# Patient Record
Sex: Male | Born: 1960
Health system: Southern US, Community
[De-identification: ages and names within clinical notes are randomized; demographics above are authoritative.]

## PROBLEM LIST (undated history)

## (undated) DIAGNOSIS — E349 Endocrine disorder, unspecified: Secondary | ICD-10-CM

## (undated) DIAGNOSIS — E785 Hyperlipidemia, unspecified: Secondary | ICD-10-CM

## (undated) DIAGNOSIS — N529 Male erectile dysfunction, unspecified: Secondary | ICD-10-CM

## (undated) DIAGNOSIS — K219 Gastro-esophageal reflux disease without esophagitis: Secondary | ICD-10-CM

## (undated) DIAGNOSIS — E119 Type 2 diabetes mellitus without complications: Secondary | ICD-10-CM

## (undated) DIAGNOSIS — G4733 Obstructive sleep apnea (adult) (pediatric): Secondary | ICD-10-CM

## (undated) HISTORY — DX: Endocrine disorder, unspecified: E34.9

## (undated) HISTORY — DX: Obstructive sleep apnea (adult) (pediatric): G47.33

## (undated) HISTORY — PX: TONSILLECTOMY: SUR1361

## (undated) HISTORY — DX: Gastro-esophageal reflux disease without esophagitis: K21.9

## (undated) HISTORY — DX: Hyperlipidemia, unspecified: E78.5

## (undated) HISTORY — DX: Type 2 diabetes mellitus without complications: E11.9

## (undated) HISTORY — DX: Male erectile dysfunction, unspecified: N52.9

---

## 2003-03-10 ENCOUNTER — Ambulatory Visit (HOSPITAL_BASED_OUTPATIENT_CLINIC_OR_DEPARTMENT_OTHER): Admission: RE | Admit: 2003-03-10 | Discharge: 2003-03-10 | Payer: Self-pay | Admitting: Family Medicine

## 2004-02-07 ENCOUNTER — Encounter: Admission: RE | Admit: 2004-02-07 | Discharge: 2004-02-07 | Payer: Self-pay | Admitting: Family Medicine

## 2004-06-24 ENCOUNTER — Ambulatory Visit: Payer: Self-pay | Admitting: Pulmonary Disease

## 2004-06-26 ENCOUNTER — Ambulatory Visit (HOSPITAL_COMMUNITY): Admission: RE | Admit: 2004-06-26 | Discharge: 2004-06-26 | Payer: Self-pay | Admitting: Pulmonary Disease

## 2004-07-27 ENCOUNTER — Ambulatory Visit: Payer: Self-pay | Admitting: Pulmonary Disease

## 2004-07-31 ENCOUNTER — Ambulatory Visit: Payer: Self-pay | Admitting: Pulmonary Disease

## 2004-08-13 ENCOUNTER — Ambulatory Visit: Payer: Self-pay | Admitting: Gastroenterology

## 2004-09-04 ENCOUNTER — Ambulatory Visit: Payer: Self-pay | Admitting: Gastroenterology

## 2004-10-09 ENCOUNTER — Ambulatory Visit: Payer: Self-pay | Admitting: Pulmonary Disease

## 2008-10-17 ENCOUNTER — Encounter: Payer: Self-pay | Admitting: Cardiovascular Disease

## 2008-11-02 ENCOUNTER — Encounter: Payer: Self-pay | Admitting: Cardiovascular Disease

## 2008-12-17 ENCOUNTER — Encounter: Payer: Self-pay | Admitting: Cardiovascular Disease

## 2008-12-18 ENCOUNTER — Ambulatory Visit: Payer: Self-pay | Admitting: Cardiovascular Disease

## 2008-12-18 DIAGNOSIS — E785 Hyperlipidemia, unspecified: Secondary | ICD-10-CM

## 2008-12-18 DIAGNOSIS — R0602 Shortness of breath: Secondary | ICD-10-CM | POA: Insufficient documentation

## 2008-12-19 ENCOUNTER — Ambulatory Visit: Payer: Self-pay | Admitting: Cardiovascular Disease

## 2008-12-19 ENCOUNTER — Encounter: Payer: Self-pay | Admitting: Cardiovascular Disease

## 2010-06-07 LAB — CONVERTED CEMR LAB
ALT: 28 units/L
BUN: 14 mg/dL
Basophils Relative: 1 %
CO2: 21 meq/L
Calcium: 9.2 mg/dL
Chloride: 102 meq/L
Creatinine, Ser: 1.05 mg/dL
Lymphocytes, automated: 30 %
Neutrophils Relative %: 60 %
PSA: 0.6 ng/mL
Platelets: 191 10*3/uL
RBC: 1.59 M/uL
RDW: 13.7 %
Total Bilirubin: 0.4 mg/dL
WBC: 7.7 10*3/uL

## 2010-10-30 ENCOUNTER — Encounter: Payer: Self-pay | Admitting: Cardiovascular Disease

## 2011-01-20 ENCOUNTER — Encounter: Payer: Self-pay | Admitting: Gastroenterology

## 2011-01-27 ENCOUNTER — Telehealth: Payer: Self-pay | Admitting: *Deleted

## 2011-01-27 NOTE — Telephone Encounter (Signed)
Sent no show letter and cx'd procedure for colon on 02/10/11.  Kyle Campbell

## 2011-02-02 ENCOUNTER — Other Ambulatory Visit: Payer: Self-pay | Admitting: Gastroenterology

## 2011-02-10 ENCOUNTER — Ambulatory Visit (AMBULATORY_SURGERY_CENTER): Payer: Managed Care, Other (non HMO) | Admitting: *Deleted

## 2011-02-10 ENCOUNTER — Other Ambulatory Visit: Payer: Self-pay | Admitting: Gastroenterology

## 2011-02-10 VITALS — Ht 73.0 in | Wt 244.5 lb

## 2011-02-10 DIAGNOSIS — Z1211 Encounter for screening for malignant neoplasm of colon: Secondary | ICD-10-CM

## 2011-02-10 MED ORDER — MOVIPREP 100 G PO SOLR: ORAL | Status: DC

## 2011-02-15 ENCOUNTER — Telehealth: Payer: Self-pay | Admitting: Gastroenterology

## 2011-02-18 NOTE — Telephone Encounter (Signed)
Rx for MoviPrep call in to [facility] on [address] in [place].  Message left on answering machine. Ezra Sites

## 2011-02-24 ENCOUNTER — Ambulatory Visit (AMBULATORY_SURGERY_CENTER): Payer: Managed Care, Other (non HMO) | Admitting: Gastroenterology

## 2011-02-24 ENCOUNTER — Encounter: Payer: Self-pay | Admitting: Gastroenterology

## 2011-02-24 VITALS — BP 116/72 | HR 72 | Temp 98.4°F | Resp 18 | Ht 73.0 in | Wt 244.0 lb

## 2011-02-24 DIAGNOSIS — K625 Hemorrhage of anus and rectum: Secondary | ICD-10-CM

## 2011-02-24 DIAGNOSIS — Z1211 Encounter for screening for malignant neoplasm of colon: Secondary | ICD-10-CM

## 2011-02-24 DIAGNOSIS — K648 Other hemorrhoids: Secondary | ICD-10-CM

## 2011-02-24 LAB — GLUCOSE, CAPILLARY: Glucose-Capillary: 98 mg/dL (ref 70–99)

## 2011-02-24 LAB — HM COLONOSCOPY

## 2011-02-24 MED ORDER — SODIUM CHLORIDE 0.9 % IV SOLN: 500.0000 mL | INTRAVENOUS | Status: DC

## 2011-02-24 NOTE — Patient Instructions (Signed)
Discharge instructions given with verbal understanding.  Handouts on hemorrhoids and a high fiber diet given.  Resume previous medications. 

## 2011-02-25 ENCOUNTER — Telehealth: Payer: Self-pay | Admitting: *Deleted

## 2011-02-25 NOTE — Telephone Encounter (Signed)

## 2012-08-08 ENCOUNTER — Telehealth: Payer: Self-pay | Admitting: Family Medicine

## 2012-08-08 ENCOUNTER — Other Ambulatory Visit: Payer: Self-pay | Admitting: Family Medicine

## 2012-08-08 MED ORDER — TESTOSTERONE CYPIONATE 200 MG/ML IM SOLN: 200.0000 mg | INTRAMUSCULAR | Status: DC

## 2012-08-08 NOTE — Telephone Encounter (Signed)
rx refilled.

## 2012-08-11 ENCOUNTER — Telehealth: Payer: Self-pay | Admitting: Family Medicine

## 2012-08-14 NOTE — Telephone Encounter (Signed)
Pt aware to pick up

## 2012-08-14 NOTE — Telephone Encounter (Signed)
There is a written rx on my desk.

## 2012-12-14 ENCOUNTER — Encounter: Payer: Self-pay | Admitting: Family Medicine

## 2012-12-14 ENCOUNTER — Other Ambulatory Visit: Payer: Self-pay | Admitting: Family Medicine

## 2012-12-14 NOTE — Telephone Encounter (Signed)
Medication refill for one time only.  Patient needs to be seen.  Letter sent for patient to call and schedule 

## 2012-12-27 ENCOUNTER — Telehealth: Payer: Self-pay | Admitting: Family Medicine

## 2012-12-28 MED ORDER — FLUTICASONE-SALMETEROL 250-50 MCG/DOSE IN AEPB: 1.0000 | INHALATION_SPRAY | RESPIRATORY_TRACT | Status: DC

## 2012-12-28 NOTE — Telephone Encounter (Signed)
RX printed, left up front and patient aware to pick up  

## 2013-01-26 ENCOUNTER — Other Ambulatory Visit: Payer: BC Managed Care – PPO

## 2013-01-26 ENCOUNTER — Other Ambulatory Visit: Payer: Self-pay | Admitting: Family Medicine

## 2013-01-26 DIAGNOSIS — Z0000 Encounter for general adult medical examination without abnormal findings: Secondary | ICD-10-CM

## 2013-01-26 LAB — LIPID PANEL
HDL: 33 mg/dL — ABNORMAL LOW (ref >39)
LDL Cholesterol: 93 mg/dL (ref 0–99)
Triglycerides: 222 mg/dL — ABNORMAL HIGH (ref <150)

## 2013-01-26 LAB — COMPREHENSIVE METABOLIC PANEL
Albumin: 4.4 g/dL (ref 3.5–5.2)
Alkaline Phosphatase: 73 U/L (ref 39–117)
BUN: 13 mg/dL (ref 6–23)
Calcium: 9.4 mg/dL (ref 8.4–10.5)
Chloride: 103 mEq/L (ref 96–112)
Glucose, Bld: 121 mg/dL — ABNORMAL HIGH (ref 70–99)
Potassium: 4.4 mEq/L (ref 3.5–5.3)
Sodium: 140 mEq/L (ref 135–145)
Total Protein: 6.6 g/dL (ref 6.0–8.3)

## 2013-01-26 LAB — CBC WITH DIFFERENTIAL/PLATELET
Basophils Relative: 1 % (ref 0–1)
HCT: 43.8 % (ref 39.0–52.0)
Hemoglobin: 15.3 g/dL (ref 13.0–17.0)
Lymphocytes Relative: 32 % (ref 12–46)
Lymphs Abs: 2 10*3/uL (ref 0.7–4.0)
MCHC: 34.9 g/dL (ref 30.0–36.0)
Monocytes Absolute: 0.4 10*3/uL (ref 0.1–1.0)
Monocytes Relative: 6 % (ref 3–12)
Neutro Abs: 3.6 10*3/uL (ref 1.7–7.7)
Neutrophils Relative %: 56 % (ref 43–77)
RBC: 4.76 MIL/uL (ref 4.22–5.81)
WBC: 6.4 10*3/uL (ref 4.0–10.5)

## 2013-01-26 LAB — PSA: PSA: 0.55 ng/mL (ref <=4.00)

## 2013-01-30 LAB — HEMOGLOBIN A1C: Mean Plasma Glucose: 143 mg/dL — ABNORMAL HIGH (ref <117)

## 2013-02-01 ENCOUNTER — Other Ambulatory Visit: Payer: Self-pay | Admitting: Family Medicine

## 2013-02-02 ENCOUNTER — Ambulatory Visit (INDEPENDENT_AMBULATORY_CARE_PROVIDER_SITE_OTHER): Payer: BC Managed Care – PPO | Admitting: Family Medicine

## 2013-02-02 ENCOUNTER — Encounter: Payer: Self-pay | Admitting: Family Medicine

## 2013-02-02 VITALS — BP 129/78 | HR 76 | Temp 98.0°F | Resp 14 | Ht 73.0 in | Wt 254.0 lb

## 2013-02-02 DIAGNOSIS — Z0000 Encounter for general adult medical examination without abnormal findings: Secondary | ICD-10-CM

## 2013-02-02 DIAGNOSIS — E291 Testicular hypofunction: Secondary | ICD-10-CM

## 2013-02-02 DIAGNOSIS — Z23 Encounter for immunization: Secondary | ICD-10-CM | POA: Diagnosis not present

## 2013-02-02 DIAGNOSIS — E349 Endocrine disorder, unspecified: Secondary | ICD-10-CM | POA: Insufficient documentation

## 2013-02-02 DIAGNOSIS — E119 Type 2 diabetes mellitus without complications: Secondary | ICD-10-CM | POA: Insufficient documentation

## 2013-02-02 MED ORDER — METFORMIN HCL 1000 MG PO TABS: 1000.0000 mg | ORAL_TABLET | Freq: Two times a day (BID) | ORAL | Status: DC

## 2013-02-02 NOTE — Progress Notes (Signed)
Subjective:    Patient ID: Kyle Campbell, male    DOB: February 15, 1961, 52 y.o.   MRN: [id number]  HPI Patient is here for CPE.  His most recent labs are listed below:   Lab on 01/26/2013  Component Date Value Range Status  . WBC 01/26/2013 6.4  4.0 - 10.5 K/uL Final  . RBC 01/26/2013 4.76  4.22 - 5.81 MIL/uL Final  . Hemoglobin 01/26/2013 15.3  13.0 - 17.0 g/dL Final  . HCT 08/65/7846 43.8  39.0 - 52.0 % Final  . MCV 01/26/2013 92.0  78.0 - 100.0 fL Final  . MCH 01/26/2013 32.1  26.0 - 34.0 pg Final  . MCHC 01/26/2013 34.9  30.0 - 36.0 g/dL Final  . RDW 96/29/5284 13.8  11.5 - 15.5 % Final  . Platelets 01/26/2013 176  150 - 400 K/uL Final  . Neutrophils Relative % 01/26/2013 56  43 - 77 % Final  . Neutro Abs 01/26/2013 3.6  1.7 - 7.7 K/uL Final  . Lymphocytes Relative 01/26/2013 32  12 - 46 % Final  . Lymphs Abs 01/26/2013 2.0  0.7 - 4.0 K/uL Final  . Monocytes Relative 01/26/2013 6  3 - 12 % Final  . Monocytes Absolute 01/26/2013 0.4  0.1 - 1.0 K/uL Final  . Eosinophils Relative 01/26/2013 5  0 - 5 % Final  . Eosinophils Absolute 01/26/2013 0.3  0.0 - 0.7 K/uL Final  . Basophils Relative 01/26/2013 1  0 - 1 % Final  . Basophils Absolute 01/26/2013 0.1  0.0 - 0.1 K/uL Final  . Smear Review 01/26/2013 Criteria for review not met   Final  . Sodium 01/26/2013 140  135 - 145 mEq/L Final  . Potassium 01/26/2013 4.4  3.5 - 5.3 mEq/L Final  . Chloride 01/26/2013 103  96 - 112 mEq/L Final  . CO2 01/26/2013 28  19 - 32 mEq/L Final  . Glucose, Bld 01/26/2013 121* 70 - 99 mg/dL Final  . BUN 13/24/4010 13  6 - 23 mg/dL Final  . Creat 27/25/3664 1.12  0.50 - 1.35 mg/dL Final  . Total Bilirubin 01/26/2013 0.6  0.3 - 1.2 mg/dL Final  . Alkaline Phosphatase 01/26/2013 73  39 - 117 U/L Final  . AST 01/26/2013 27  0 - 37 U/L Final  . ALT 01/26/2013 52  0 - 53 U/L Final  . Total Protein 01/26/2013 6.6  6.0 - 8.3 g/dL Final  . Albumin 40/34/7425 4.4  3.5 - 5.2 g/dL Final  . Calcium 95/63/8756 9.4   8.4 - 10.5 mg/dL Final  . Cholesterol 43/32/9518 170  0 - 200 mg/dL Final   Comment: ATP III Classification:                                < 200        mg/dL        Desirable                               200 - 239     mg/dL        Borderline High                               >= 240        mg/dL        High                             .  Triglycerides 01/26/2013 222* <150 mg/dL Final  . HDL 16/02/9603 33* >39 mg/dL Final  . Total CHOL/HDL Ratio 01/26/2013 5.2   Final  . VLDL 01/26/2013 44* 0 - 40 mg/dL Final  . LDL Cholesterol 01/26/2013 93  0 - 99 mg/dL Final   Comment:                            Total Cholesterol/HDL Ratio:CHD Risk                                                 Coronary Heart Disease Risk Table                                                                 Men       Women                                   1/2 Average Risk              3.4        3.3                                       Average Risk              5.0        4.4                                    2X Average Risk              9.6        7.1                                    3X Average Risk             23.4       11.0                          Use the calculated Patient Ratio above and the CHD Risk table                           to determine the patient's CHD Risk.                          ATP III Classification (LDL):                                < 100        mg/dL         Optimal  100 - 129     mg/dL         Near or Above Optimal                               130 - 159     mg/dL         Borderline High                               160 - 189     mg/dL         High                                > 190        mg/dL         Very High                             . PSA 01/26/2013 0.55  <=4.00 ng/mL Final   Comment: Test Methodology: ECLIA PSA (Electrochemiluminescence Immunoassay)                                                     For PSA values from 2.5-4.0,  particularly in younger men <60 years                          old, the AUA and NCCN suggest testing for % Free PSA (3515) and                          evaluation of the rate of increase in PSA (PSA velocity).  Orders Only on 01/26/2013  Component Date Value Range Status  . Hemoglobin A1C 01/26/2013 6.6* <5.7 % Final   Comment:                                                                                                 According to the ADA Clinical Practice Recommendations for 2011, when                          HbA1c is used as a screening test:                                                       >=6.5%   Diagnostic of Diabetes Mellitus                                     (  if abnormal result is confirmed)                                                     5.7-6.4%   Increased risk of developing Diabetes Mellitus                                                     References:Diagnosis and Classification of Diabetes Mellitus,Diabetes                          Care,2011,34(Suppl 1):S62-S69 and Standards of Medical Care in                                  Diabetes - 2011,Diabetes Care,2011,34 (Suppl 1):S11-S61.                             . Mean Plasma Glucose 01/26/2013 143* <117 mg/dL Final   Labs are significant for HgA1c of 6.6, FBS of 121, HDL of 33, and Trigs of 222.  He reports profound fatigue. He is taking testosterone 200 mg every 2 weeks. However he does not feel like he is taking enough testosterone. He is not able to exercise on any regular basis. He also admits to eating high carbohydrate diet. He is due for his tetanus shot. He declines his flu shot today. His colonoscopy was performed at 2012 is not due again until 2022.  He has never had an eye exam. Past Medical History  Diagnosis Date  . Asthma   . Hyperlipidemia   . GERD (gastroesophageal reflux disease)   . Erectile dysfunction   . Obstructive sleep apnea     compliant with CPAP  . Diabetes mellitus without  complication    Current Outpatient Prescriptions on File Prior to Visit  Medication Sig Dispense Refill  . aspirin 81 MG EC tablet Take 81 mg by mouth daily.        Marland Kitchen atorvastatin (LIPITOR) 80 MG tablet Take 80 mg by mouth daily.        . BD INTEGRA SYRINGE 25G X 1" 3 ML MISC USE WITH TESTOSTERONE EVERY 2 WEEKS  50 each  0  . cholecalciferol (VITAMIN D) 1000 UNITS tablet Take 1,000 Units by mouth daily.        . fish oil-omega-3 fatty acids 1000 MG capsule Take 2 g by mouth daily.        . Fluticasone-Salmeterol (ADVAIR DISKUS) 250-50 MCG/DOSE AEPB Inhale 1 puff into the lungs as directed.  60 each  5  . metFORMIN (GLUCOPHAGE) 500 MG tablet Take one tablet by mouth twice daily  60 tablet  0  . NON FORMULARY PRO AIR HFA INHALER: use as directed.       Marland Kitchen omega-3 acid ethyl esters (LOVAZA) 1 G capsule Take 1 g by mouth 2 (two) times daily. 2 caps       . testosterone cypionate (DEPO-TESTOSTERONE) 200 MG/ML injection Inject 1 mL (200 mg total) into the muscle every 14 (fourteen) days.  10 mL  3  .  vardenafil (LEVITRA) 20 MG tablet Take 20 mg by mouth as needed.         Current Facility-Administered Medications on File Prior to Visit  Medication Dose Route Frequency Provider Last Rate Last Dose  . 0.9 %  sodium chloride infusion  500 mL Intravenous Continuous Louis Meckel, MD       Past Surgical History  Procedure Laterality Date  . Tonsillectomy      age 89   History   Social History  . Marital Status: Single    Spouse Name: N/A    Number of Children: N/A  . Years of Education: N/A   Occupational History  . Not on file.   Social History Main Topics  . Smoking status: Never Smoker   . Smokeless tobacco: Never Used  . Alcohol Use: 0.5 oz/week    1 drink(s) per week  . Drug Use: No  . Sexual Activity: Yes     Comment: Married.     Other Topics Concern  . Not on file   Social History Narrative   Has 3 children. One 47 year, and 53 year old twins. Works as Naval architect.  Occasional  ETOH.    Family History  Problem Relation Age of Onset  . Lung cancer Father       Review of Systems  All other systems reviewed and are negative.       Objective:   Physical Exam  Vitals reviewed. Constitutional: He is oriented to person, place, and time. He appears well-developed and well-nourished. No distress.  HENT:  Head: Normocephalic and atraumatic.  Right Ear: External ear normal.  Left Ear: External ear normal.  Nose: Nose normal.  Mouth/Throat: Oropharynx is clear and moist. No oropharyngeal exudate.  Eyes: Conjunctivae and EOM are normal. Pupils are equal, round, and reactive to light. Right eye exhibits no discharge. Left eye exhibits no discharge. No scleral icterus.  Neck: Normal range of motion. Neck supple. No JVD present. No tracheal deviation present. No thyromegaly present.  Cardiovascular: Normal rate, regular rhythm, normal heart sounds and intact distal pulses.  Exam reveals no gallop and no friction rub.   No murmur heard. Pulmonary/Chest: Effort normal and breath sounds normal. No stridor. No respiratory distress. He has no wheezes. He has no rales. He exhibits no tenderness.  Abdominal: Soft. Bowel sounds are normal. He exhibits no distension and no mass. There is no tenderness. There is no rebound and no guarding.  Genitourinary: Rectum normal, prostate normal and penis normal.  Musculoskeletal: Normal range of motion. He exhibits no edema and no tenderness.  Lymphadenopathy:    He has no cervical adenopathy.  Neurological: He is alert and oriented to person, place, and time. He has normal reflexes. He displays normal reflexes. No cranial nerve deficit. He exhibits normal muscle tone. Coordination normal.  Skin: Skin is warm. No rash noted. He is not diaphoretic. No erythema. No pallor.  Psychiatric: He has a normal mood and affect. His behavior is normal. Judgment and thought content normal.          Assessment & Plan:  1. Routine  general medical examination at a health care facility On the flu shot but patient declined. We will update his tetanus shot today. His colonoscopy is up-to-date. His prostate exam is normal. I will check a testosterone level. If sub-therapeutic, we can discuss possibly increasing his testosterone. He understands the risk of cardiovascular disease on testosterone.  I also increased his metformin to 1000 mg by  mouth twice a day. I asked her recheck a hemoglobin A1c in 3-6 months. I also recommended the patient get an annual exam. Also recommend he increase his aerobic exercise, lose 10-15 pounds, and try to adhere to a low carbohydrate diet. See the patient back in 3-6 months or as needed.

## 2013-02-03 LAB — TESTOSTERONE: Testosterone: 637 ng/dL (ref 300–890)

## 2013-04-03 ENCOUNTER — Other Ambulatory Visit: Payer: Self-pay | Admitting: Family Medicine

## 2013-04-03 MED ORDER — TESTOSTERONE CYPIONATE 200 MG/ML IM SOLN: 200.0000 mg | INTRAMUSCULAR | Status: DC

## 2013-04-03 NOTE — Telephone Encounter (Signed)
rx was printed and faxed to pharmacy 

## 2013-07-13 ENCOUNTER — Other Ambulatory Visit: Payer: Self-pay | Admitting: Family Medicine

## 2014-01-15 ENCOUNTER — Other Ambulatory Visit: Payer: Self-pay | Admitting: Family Medicine

## 2014-01-16 ENCOUNTER — Other Ambulatory Visit: Payer: Self-pay | Admitting: Family Medicine

## 2014-01-16 MED ORDER — "SYRINGE/NEEDLE (DISP) 25G X 1"" 3 ML MISC": Status: DC

## 2014-01-16 NOTE — Telephone Encounter (Signed)
Sent to pharm ....

## 2014-02-12 ENCOUNTER — Other Ambulatory Visit: Payer: Self-pay | Admitting: Family Medicine

## 2014-03-28 ENCOUNTER — Other Ambulatory Visit (HOSPITAL_COMMUNITY): Payer: Self-pay | Admitting: Specialist

## 2014-03-28 DIAGNOSIS — M542 Cervicalgia: Secondary | ICD-10-CM

## 2014-04-08 ENCOUNTER — Telehealth: Payer: Self-pay | Admitting: Family Medicine

## 2014-04-08 NOTE — Telephone Encounter (Signed)
Patient wife calling to say that the pharmacy has faxed over request for his advair, and it needs authorization, they have not heard anything from us  Please call wife at [phone]

## 2014-04-09 MED ORDER — FLUTICASONE-SALMETEROL 250-50 MCG/DOSE IN AEPB: INHALATION_SPRAY | RESPIRATORY_TRACT | Status: DC

## 2014-04-09 MED ORDER — FLUTICASONE-SALMETEROL 250-50 MCG/DOSE IN AEPB
INHALATION_SPRAY | RESPIRATORY_TRACT | Status: DC
Start: 2014-04-09 — End: 2014-06-06

## 2014-04-09 NOTE — Telephone Encounter (Signed)
Rx's printed as per pt instruction and left up front for pt to pu.

## 2014-04-10 ENCOUNTER — Other Ambulatory Visit (HOSPITAL_COMMUNITY): Payer: Managed Care, Other (non HMO)

## 2014-04-10 NOTE — Telephone Encounter (Signed)
PA submitted through [email or web address] through [facility] and this was approved and approval faxed to [facility]

## 2014-04-18 ENCOUNTER — Ambulatory Visit (HOSPITAL_COMMUNITY)
Admission: RE | Admit: 2014-04-18 | Discharge: 2014-04-18 | Disposition: A | Discharge status: Home / Self Care | Payer: BC Managed Care – PPO | Source: Ambulatory Visit | Attending: Specialist | Admitting: Specialist

## 2014-04-18 DIAGNOSIS — M79602 Pain in left arm: Secondary | ICD-10-CM | POA: Insufficient documentation

## 2014-04-18 DIAGNOSIS — M25512 Pain in left shoulder: Secondary | ICD-10-CM | POA: Diagnosis not present

## 2014-04-18 DIAGNOSIS — M542 Cervicalgia: Secondary | ICD-10-CM

## 2014-05-13 ENCOUNTER — Other Ambulatory Visit: Payer: BC Managed Care – PPO

## 2014-05-16 ENCOUNTER — Ambulatory Visit: Payer: BC Managed Care – PPO | Admitting: Family Medicine

## 2014-05-27 ENCOUNTER — Ambulatory Visit: Payer: Self-pay | Admitting: Family Medicine

## 2014-05-28 ENCOUNTER — Other Ambulatory Visit: Payer: Self-pay

## 2014-05-29 ENCOUNTER — Other Ambulatory Visit: Payer: BLUE CROSS/BLUE SHIELD

## 2014-05-29 DIAGNOSIS — E349 Endocrine disorder, unspecified: Secondary | ICD-10-CM

## 2014-05-29 DIAGNOSIS — E785 Hyperlipidemia, unspecified: Secondary | ICD-10-CM

## 2014-05-29 DIAGNOSIS — Z79899 Other long term (current) drug therapy: Secondary | ICD-10-CM

## 2014-05-29 DIAGNOSIS — E119 Type 2 diabetes mellitus without complications: Secondary | ICD-10-CM

## 2014-05-29 LAB — LIPID PANEL
Cholesterol: 151 mg/dL (ref 0–200)
HDL: 35 mg/dL — ABNORMAL LOW (ref >39)
LDL CALC: 75 mg/dL (ref 0–99)
Total CHOL/HDL Ratio: 4.3 Ratio
Triglycerides: 206 mg/dL — ABNORMAL HIGH (ref <150)
VLDL: 41 mg/dL — AB (ref 0–40)

## 2014-05-29 LAB — COMPREHENSIVE METABOLIC PANEL
ALT: 59 U/L — AB (ref 0–53)
AST: 29 U/L (ref 0–37)
Albumin: 4.2 g/dL (ref 3.5–5.2)
Alkaline Phosphatase: 79 U/L (ref 39–117)
BILIRUBIN TOTAL: 0.6 mg/dL (ref 0.2–1.2)
BUN: 12 mg/dL (ref 6–23)
CO2: 26 meq/L (ref 19–32)
Calcium: 9.4 mg/dL (ref 8.4–10.5)
Chloride: 104 mEq/L (ref 96–112)
Creat: 1.02 mg/dL (ref 0.50–1.35)
GLUCOSE: 200 mg/dL — AB (ref 70–99)
POTASSIUM: 4.8 meq/L (ref 3.5–5.3)
Sodium: 140 mEq/L (ref 135–145)
TOTAL PROTEIN: 6.6 g/dL (ref 6.0–8.3)

## 2014-05-29 LAB — HEMOGLOBIN A1C
Hgb A1c MFr Bld: 8.2 % — ABNORMAL HIGH (ref <5.7)
MEAN PLASMA GLUCOSE: 189 mg/dL — AB (ref <117)

## 2014-05-30 LAB — TESTOSTERONE: TESTOSTERONE: 211 ng/dL — AB (ref 300–890)

## 2014-06-03 ENCOUNTER — Ambulatory Visit: Payer: Self-pay | Admitting: Family Medicine

## 2014-06-06 ENCOUNTER — Ambulatory Visit (INDEPENDENT_AMBULATORY_CARE_PROVIDER_SITE_OTHER): Payer: BLUE CROSS/BLUE SHIELD | Admitting: Family Medicine

## 2014-06-06 ENCOUNTER — Encounter: Payer: Self-pay | Admitting: Family Medicine

## 2014-06-06 VITALS — BP 118/60 | HR 78 | Temp 98.7°F | Resp 12 | Ht 73.0 in | Wt 247.0 lb

## 2014-06-06 DIAGNOSIS — E1165 Type 2 diabetes mellitus with hyperglycemia: Secondary | ICD-10-CM

## 2014-06-06 DIAGNOSIS — IMO0002 Reserved for concepts with insufficient information to code with codable children: Secondary | ICD-10-CM

## 2014-06-06 DIAGNOSIS — N400 Enlarged prostate without lower urinary tract symptoms: Secondary | ICD-10-CM

## 2014-06-06 NOTE — Progress Notes (Signed)
Subjective:    Patient ID: Kyle Campbell, male    DOB: 08-14-1960, 54 y.o.   MRN: [id number]  HPI  Patient has not been seen in quite some time. He quit taking metformin due to fear of side effects. He denies any chest pain shortness of breath or dyspnea on exertion. He denies any polyuria, polydipsia, or blurred vision. His most recent lab work as listed below: Lab on 05/29/2014  Component Date Value Ref Range Status  . Sodium 05/29/2014 140  135 - 145 mEq/L Final  . Potassium 05/29/2014 4.8  3.5 - 5.3 mEq/L Final  . Chloride 05/29/2014 104  96 - 112 mEq/L Final  . CO2 05/29/2014 26  19 - 32 mEq/L Final  . Glucose, Bld 05/29/2014 200* 70 - 99 mg/dL Final  . BUN 40/98/1191 12  6 - 23 mg/dL Final  . Creat 47/82/9562 1.02  0.50 - 1.35 mg/dL Final  . Total Bilirubin 05/29/2014 0.6  0.2 - 1.2 mg/dL Final  . Alkaline Phosphatase 05/29/2014 79  39 - 117 U/L Final  . AST 05/29/2014 29  0 - 37 U/L Final  . ALT 05/29/2014 59* 0 - 53 U/L Final  . Total Protein 05/29/2014 6.6  6.0 - 8.3 g/dL Final  . Albumin 13/12/6576 4.2  3.5 - 5.2 g/dL Final  . Calcium 46/96/2952 9.4  8.4 - 10.5 mg/dL Final  . Hgb W4X MFr Bld 05/29/2014 8.2* <5.7 % Final   Comment:                                                                        According to the ADA Clinical Practice Recommendations for 2011, when HbA1c is used as a screening test:     >=6.5%   Diagnostic of Diabetes Mellitus            (if abnormal result is confirmed)   5.7-6.4%   Increased risk of developing Diabetes Mellitus   References:Diagnosis and Classification of Diabetes Mellitus,Diabetes Care,2011,34(Suppl 1):S62-S69 and Standards of Medical Care in         Diabetes - 2011,Diabetes Care,2011,34 (Suppl 1):S11-S61.     . Mean Plasma Glucose 05/29/2014 189* <117 mg/dL Final  . Cholesterol 32/44/0102 151  0 - 200 mg/dL Final   Comment: ATP III Classification:       < 200        mg/dL        Desirable      725 - 239     mg/dL         Borderline High      >= 240        mg/dL        High     . Triglycerides 05/29/2014 206* <150 mg/dL Final  . HDL 36/64/4034 35* >39 mg/dL Final  . Total CHOL/HDL Ratio 05/29/2014 4.3   Final  . VLDL 05/29/2014 41* 0 - 40 mg/dL Final  . LDL Cholesterol 05/29/2014 75  0 - 99 mg/dL Final   Comment:   Total Cholesterol/HDL Ratio:CHD Risk                        Coronary Heart Disease Risk Table  Men       Women          1/2 Average Risk              3.4        3.3              Average Risk              5.0        4.4           2X Average Risk              9.6        7.1           3X Average Risk             23.4       11.0 Use the calculated Patient Ratio above and the CHD Risk table  to determine the patient's CHD Risk. ATP III Classification (LDL):       < 100        mg/dL         Optimal      161 - 129     mg/dL         Near or Above Optimal      130 - 159     mg/dL         Borderline High      160 - 189     mg/dL         High       > 096        mg/dL         Very High     . Testosterone 05/29/2014 211* 300 - 890 ng/dL Final   Comment:           Tanner Stage       Male              Male               I              < 30 ng/dL        < 10 ng/dL               II             < 150 ng/dL       < 30 ng/dL               III            100-320 ng/dL     < 35 ng/dL               IV             200-970 ng/dL     04-54 ng/dL               V/Adult        300-890 ng/dL     09-81 ng/dL      He also reports nocturia 2-3 episodes per night with weak urinary stream and hesitancy. He has a +1 prostate on examination today. He denies any dysuria. Past Medical History  Diagnosis Date  . Asthma   . Hyperlipidemia   . GERD (gastroesophageal reflux disease)   . Erectile dysfunction   . Obstructive sleep apnea     compliant with CPAP  . Diabetes mellitus without complication   . Testosterone deficiency  Past Surgical History  Procedure Laterality  Date  . Tonsillectomy      age 89   Current Outpatient Prescriptions on File Prior to Visit  Medication Sig Dispense Refill  . Fluticasone-Salmeterol (ADVAIR DISKUS) 250-50 MCG/DOSE AEPB INHALE ONE PUFF BY MOUTH AS DIRECTED 60 each 3  . PROAIR HFA 108 (90 BASE) MCG/ACT inhaler Inhale one puff by mouth every four hours as needed 8.5 g PRN  . SYRINGE-NEEDLE, DISP, 3 ML (BD INTEGRA SYRINGE) 25G X 1" 3 ML MISC USE WITH TESTOSTERONE EVERY 2 WEEKS 50 each 0  . testosterone cypionate (DEPOTESTOTERONE CYPIONATE) 200 MG/ML injection INJECT 1ML INTRAMUSCULARLY EVERY 14 DAYS 10 mL 0  . vardenafil (LEVITRA) 20 MG tablet Take 20 mg by mouth as needed.       No current facility-administered medications on file prior to visit.   No Known Allergies History   Social History  . Marital Status: Single    Spouse Name: N/A    Number of Children: N/A  . Years of Education: N/A   Occupational History  . Not on file.   Social History Main Topics  . Smoking status: Never Smoker   . Smokeless tobacco: Never Used  . Alcohol Use: 0.5 oz/week    1 drink(s) per week  . Drug Use: No  . Sexual Activity: Yes     Comment: Married.     Other Topics Concern  . Not on file   Social History Narrative   Has 3 children. One 4320 year, and 52 year old twins. 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      Review of Systems  All other systems reviewed and are negative.      Objective:   Physical Exam  Cardiovascular: Normal rate, regular rhythm, normal heart sounds and intact distal pulses.   No murmur heard. Pulmonary/Chest: Effort normal and breath sounds normal. No respiratory distress. He has no wheezes. He has no rales.  Abdominal: Soft. Bowel sounds are normal. He exhibits no distension and no mass. There is no tenderness. There is no rebound and no guarding.  Genitourinary: Prostate is enlarged.  Musculoskeletal: He exhibits no edema.  Vitals reviewed.         Assessment & Plan:  BPH (benign  prostatic hyperplasia) - Plan: PSA  Diabetes mellitus type II, uncontrolled  Patient has BPH but at the present time he does not want Flomax. I will check a PSA. Patient also has uncontrolled diabetes mellitus type 2. Begin metformin 1000 mg by mouth twice a day and recheck hemoglobin A1c, fasting lipid panel, and urine microalbumin in 3 months. I recommended a low carbohydrate diet, increasing aerobic exercise, and 10-15 pounds weight loss.  I did give the patient prescription for Viagra 100 mg tablets to be used as needed

## 2014-06-07 ENCOUNTER — Encounter: Payer: Self-pay | Admitting: *Deleted

## 2014-06-07 LAB — PSA: PSA: 0.59 ng/mL (ref <=4.00)

## 2014-06-27 ENCOUNTER — Other Ambulatory Visit: Payer: Self-pay | Admitting: Family Medicine

## 2014-06-28 ENCOUNTER — Telehealth: Payer: Self-pay | Admitting: Family Medicine

## 2014-06-28 NOTE — Telephone Encounter (Signed)
Medication refilled per protocol. 

## 2014-06-28 NOTE — Telephone Encounter (Signed)
error 

## 2014-07-15 LAB — HM DIABETES EYE EXAM

## 2014-09-06 ENCOUNTER — Other Ambulatory Visit: Payer: BLUE CROSS/BLUE SHIELD

## 2014-09-06 DIAGNOSIS — E785 Hyperlipidemia, unspecified: Secondary | ICD-10-CM

## 2014-09-06 DIAGNOSIS — E349 Endocrine disorder, unspecified: Secondary | ICD-10-CM

## 2014-09-06 DIAGNOSIS — Z79899 Other long term (current) drug therapy: Secondary | ICD-10-CM

## 2014-09-06 DIAGNOSIS — E119 Type 2 diabetes mellitus without complications: Secondary | ICD-10-CM

## 2014-09-06 LAB — CBC WITH DIFFERENTIAL/PLATELET
BASOS ABS: 0.1 10*3/uL (ref 0.0–0.1)
Basophils Relative: 1 % (ref 0–1)
Eosinophils Absolute: 0.3 10*3/uL (ref 0.0–0.7)
Eosinophils Relative: 4 % (ref 0–5)
HCT: 44.3 % (ref 39.0–52.0)
HEMOGLOBIN: 14.9 g/dL (ref 13.0–17.0)
Lymphocytes Relative: 29 % (ref 12–46)
Lymphs Abs: 1.9 10*3/uL (ref 0.7–4.0)
MCH: 31.8 pg (ref 26.0–34.0)
MCHC: 33.6 g/dL (ref 30.0–36.0)
MCV: 94.5 fL (ref 78.0–100.0)
MONOS PCT: 6 % (ref 3–12)
MPV: 9.7 fL (ref 8.6–12.4)
Monocytes Absolute: 0.4 10*3/uL (ref 0.1–1.0)
NEUTROS ABS: 3.9 10*3/uL (ref 1.7–7.7)
Neutrophils Relative %: 60 % (ref 43–77)
PLATELETS: 175 10*3/uL (ref 150–400)
RBC: 4.69 MIL/uL (ref 4.22–5.81)
RDW: 13.9 % (ref 11.5–15.5)
WBC: 6.5 10*3/uL (ref 4.0–10.5)

## 2014-09-06 LAB — COMPLETE METABOLIC PANEL WITH GFR
ALBUMIN: 4.1 g/dL (ref 3.5–5.2)
ALT: 46 U/L (ref 0–53)
AST: 29 U/L (ref 0–37)
Alkaline Phosphatase: 74 U/L (ref 39–117)
BUN: 12 mg/dL (ref 6–23)
CHLORIDE: 103 meq/L (ref 96–112)
CO2: 25 mEq/L (ref 19–32)
CREATININE: 1.06 mg/dL (ref 0.50–1.35)
Calcium: 9.2 mg/dL (ref 8.4–10.5)
GFR, Est Non African American: 80 mL/min
GLUCOSE: 131 mg/dL — AB (ref 70–99)
Potassium: 4.6 mEq/L (ref 3.5–5.3)
Sodium: 137 mEq/L (ref 135–145)
Total Bilirubin: 0.5 mg/dL (ref 0.2–1.2)
Total Protein: 6.5 g/dL (ref 6.0–8.3)

## 2014-09-06 LAB — HEMOGLOBIN A1C
Hgb A1c MFr Bld: 7 % — ABNORMAL HIGH (ref <5.7)
Mean Plasma Glucose: 154 mg/dL — ABNORMAL HIGH (ref <117)

## 2014-09-06 LAB — LIPID PANEL
Cholesterol: 161 mg/dL (ref 0–200)
HDL: 31 mg/dL — ABNORMAL LOW (ref >=40)
LDL CALC: 89 mg/dL (ref 0–99)
TRIGLYCERIDES: 207 mg/dL — AB (ref <150)
Total CHOL/HDL Ratio: 5.2 Ratio
VLDL: 41 mg/dL — AB (ref 0–40)

## 2014-09-06 LAB — TSH: TSH: 1.336 u[IU]/mL (ref 0.350–4.500)

## 2014-09-07 LAB — PSA: PSA: 0.66 ng/mL (ref <=4.00)

## 2014-09-12 ENCOUNTER — Encounter: Payer: Self-pay | Admitting: Family Medicine

## 2014-09-12 ENCOUNTER — Ambulatory Visit (INDEPENDENT_AMBULATORY_CARE_PROVIDER_SITE_OTHER): Payer: BLUE CROSS/BLUE SHIELD | Admitting: Family Medicine

## 2014-09-12 VITALS — BP 100/60 | HR 78 | Temp 98.1°F | Resp 16 | Ht 73.0 in | Wt 240.0 lb

## 2014-09-12 DIAGNOSIS — E1165 Type 2 diabetes mellitus with hyperglycemia: Secondary | ICD-10-CM | POA: Diagnosis not present

## 2014-09-12 DIAGNOSIS — N400 Enlarged prostate without lower urinary tract symptoms: Secondary | ICD-10-CM

## 2014-09-12 DIAGNOSIS — IMO0002 Reserved for concepts with insufficient information to code with codable children: Secondary | ICD-10-CM

## 2014-09-12 MED ORDER — TAMSULOSIN HCL 0.4 MG PO CAPS: 0.4000 mg | ORAL_CAPSULE | Freq: Every day | ORAL | Status: DC

## 2014-09-12 NOTE — Progress Notes (Signed)
Subjective:    Patient ID: Kyle Campbell, male    DOB: 1961/03/14, 54 y.o.   MRN: [id number]  HPI  Please see my last office visit, HgA1c was 8.2.  He has lost 7 lbs through diet, exercise, and weight loss.  HgA1c has fallen to 7.0.  He does report 3-4 episodes of nocturia at night, hesitancy, and weak stream. Lab on 09/06/2014  Component Date Value Ref Range Status  . Sodium 09/06/2014 137  135 - 145 mEq/L Final  . Potassium 09/06/2014 4.6  3.5 - 5.3 mEq/L Final  . Chloride 09/06/2014 103  96 - 112 mEq/L Final  . CO2 09/06/2014 25  19 - 32 mEq/L Final  . Glucose, Bld 09/06/2014 131* 70 - 99 mg/dL Final  . BUN 09/06/2014 12  6 - 23 mg/dL Final  . Creat 09/06/2014 1.06  0.50 - 1.35 mg/dL Final  . Total Bilirubin 09/06/2014 0.5  0.2 - 1.2 mg/dL Final  . Alkaline Phosphatase 09/06/2014 74  39 - 117 U/L Final  . AST 09/06/2014 29  0 - 37 U/L Final  . ALT 09/06/2014 46  0 - 53 U/L Final  . Total Protein 09/06/2014 6.5  6.0 - 8.3 g/dL Final  . Albumin 09/06/2014 4.1  3.5 - 5.2 g/dL Final  . Calcium 09/06/2014 9.2  8.4 - 10.5 mg/dL Final  . GFR, Est African American 09/06/2014 >89   Final  . GFR, Est Non African American 09/06/2014 80   Final   Comment:   The estimated GFR is a calculation valid for adults (>=38 years old) that uses the CKD-EPI algorithm to adjust for age and sex. It is   not to be used for children, pregnant women, hospitalized patients,    patients on dialysis, or with rapidly changing kidney function. According to the NKDEP, eGFR >89 is normal, 60-89 shows mild impairment, 30-59 shows moderate impairment, 15-29 shows severe impairment and <15 is ESRD.     Marland Kitchen Cholesterol 09/06/2014 161  0 - 200 mg/dL Final   Comment: ATP III Classification:       < 200        mg/dL        Desirable      200 - 239     mg/dL        Borderline High      >= 240        mg/dL        High     . Triglycerides 09/06/2014 207* <150 mg/dL Final  . HDL 09/06/2014 31* >=40 mg/dL Final   ** Please note change in reference range(s). **  . Total CHOL/HDL Ratio 09/06/2014 5.2   Final  . VLDL 09/06/2014 41* 0 - 40 mg/dL Final  . LDL Cholesterol 09/06/2014 89  0 - 99 mg/dL Final   Comment:   Total Cholesterol/HDL Ratio:CHD Risk                        Coronary Heart Disease Risk Table                                        Men       Women          1/2 Average Risk              3.4  3.3              Average Risk              5.0        4.4           2X Average Risk              9.6        7.1           3X Average Risk             23.4       11.0 Use the calculated Patient Ratio above and the CHD Risk table  to determine the patient's CHD Risk. ATP III Classification (LDL):       < 100        mg/dL         Optimal      100 - 129     mg/dL         Near or Above Optimal      130 - 159     mg/dL         Borderline High      160 - 189     mg/dL         High       > 190        mg/dL         Very High     . WBC 09/06/2014 6.5  4.0 - 10.5 K/uL Final  . RBC 09/06/2014 4.69  4.22 - 5.81 MIL/uL Final  . Hemoglobin 09/06/2014 14.9  13.0 - 17.0 g/dL Final  . HCT 09/06/2014 44.3  39.0 - 52.0 % Final  . MCV 09/06/2014 94.5  78.0 - 100.0 fL Final  . MCH 09/06/2014 31.8  26.0 - 34.0 pg Final  . MCHC 09/06/2014 33.6  30.0 - 36.0 g/dL Final  . RDW 09/06/2014 13.9  11.5 - 15.5 % Final  . Platelets 09/06/2014 175  150 - 400 K/uL Final  . MPV 09/06/2014 9.7  8.6 - 12.4 fL Final  . Neutrophils Relative % 09/06/2014 60  43 - 77 % Final  . Neutro Abs 09/06/2014 3.9  1.7 - 7.7 K/uL Final  . Lymphocytes Relative 09/06/2014 29  12 - 46 % Final  . Lymphs Abs 09/06/2014 1.9  0.7 - 4.0 K/uL Final  . Monocytes Relative 09/06/2014 6  3 - 12 % Final  . Monocytes Absolute 09/06/2014 0.4  0.1 - 1.0 K/uL Final  . Eosinophils Relative 09/06/2014 4  0 - 5 % Final  . Eosinophils Absolute 09/06/2014 0.3  0.0 - 0.7 K/uL Final  . Basophils Relative 09/06/2014 1  0 - 1 % Final  . Basophils Absolute  09/06/2014 0.1  0.0 - 0.1 K/uL Final  . Smear Review 09/06/2014 Criteria for review not met   Final  . Hgb A1c MFr Bld 09/06/2014 7.0* <5.7 % Final   Comment:                                                                        According to the ADA Clinical Practice Recommendations for 2011, when HbA1c is used as a screening test:     >=  6.5%   Diagnostic of Diabetes Mellitus            (if abnormal result is confirmed)   5.7-6.4%   Increased risk of developing Diabetes Mellitus   References:Diagnosis and Classification of Diabetes Mellitus,Diabetes QBHA,1937,90(WIOXB 1):S62-S69 and Standards of Medical Care in         Diabetes - 2011,Diabetes Care,2011,34 (Suppl 1):S11-S61.     . Mean Plasma Glucose 09/06/2014 154* <117 mg/dL Final  . TSH 09/06/2014 1.336  0.350 - 4.500 uIU/mL Final  . PSA 09/06/2014 0.66  <=4.00 ng/mL Final   Comment: Test Methodology: ECLIA PSA (Electrochemiluminescence Immunoassay)   For PSA values from 2.5-4.0, particularly in younger men <20 years old, the AUA and NCCN suggest testing for % Free PSA (3515) and evaluation of the rate of increase in PSA (PSA velocity).   Scanned Document on 08/16/2014  Component Date Value Ref Range Status  . HM Diabetic Eye Exam 07/15/2014 No Retinopathy  No Retinopathy Final   Past Medical History  Diagnosis Date  . Asthma   . Hyperlipidemia   . GERD (gastroesophageal reflux disease)   . Erectile dysfunction   . Obstructive sleep apnea     compliant with CPAP  . Diabetes mellitus without complication   . Testosterone deficiency    Past Surgical History  Procedure Laterality Date  . Tonsillectomy      age 63   Current Outpatient Prescriptions on File Prior to Visit  Medication Sig Dispense Refill  . Fluticasone-Salmeterol (ADVAIR DISKUS) 250-50 MCG/DOSE AEPB INHALE ONE PUFF BY MOUTH AS DIRECTED 60 each 3  . metFORMIN (GLUCOPHAGE) 1000 MG tablet TAKE ONE TABLET BY MOUTH TWICE A DAY WITH MEALS  60 tablet 2  .  PROAIR HFA 108 (90 BASE) MCG/ACT inhaler Inhale one puff by mouth every four hours as needed 8.5 g PRN  . SYRINGE-NEEDLE, DISP, 3 ML (BD INTEGRA SYRINGE) 25G X 1" 3 ML MISC USE WITH TESTOSTERONE EVERY 2 WEEKS 50 each 0  . testosterone cypionate (DEPOTESTOTERONE CYPIONATE) 200 MG/ML injection INJECT 1ML INTRAMUSCULARLY EVERY 14 DAYS 10 mL 0  . vardenafil (LEVITRA) 20 MG tablet Take 20 mg by mouth as needed.       No current facility-administered medications on file prior to visit.   No Known Allergies History   Social History  . Marital Status: Married    Spouse Name: N/A  . Number of Children: N/A  . Years of Education: N/A   Occupational History  . Not on file.   Social History Main Topics  . Smoking status: Never Smoker   . Smokeless tobacco: Never Used  . Alcohol Use: 0.5 oz/week    1 drink(s) per week  . Drug Use: No  . Sexual Activity: Yes     Comment: Married.     Other Topics Concern  . Not on file   Social History Narrative   Has 3 children. One 69 year, and 42 year old twins. Works as Administrator. Occasional  ETOH.     Review of Systems  All other systems reviewed and are negative.      Objective:   Physical Exam  Cardiovascular: Normal rate, regular rhythm and normal heart sounds.   Pulmonary/Chest: Effort normal and breath sounds normal. No respiratory distress. He has no wheezes. He has no rales.  Abdominal: Soft. Bowel sounds are normal. He exhibits no distension. There is no tenderness. There is no rebound and no guarding.  Musculoskeletal: He exhibits no edema.  Assessment & Plan:  BPH (benign prostatic hyperplasia) - Plan: tamsulosin (FLOMAX) 0.4 MG CAPS capsule  Diabetes mellitus type II, uncontrolled  Continue to work on diet, exercise and weight loss.  Add Januvia 50 mg poqday and recheck in 6 months.  Try flomax 0.4 mg poqhs for possible BPH although PSA and prostate exams have been nml.

## 2014-10-08 ENCOUNTER — Encounter: Payer: Self-pay | Admitting: Family Medicine

## 2014-10-08 DIAGNOSIS — K648 Other hemorrhoids: Secondary | ICD-10-CM | POA: Insufficient documentation

## 2014-11-19 ENCOUNTER — Telehealth: Payer: Self-pay | Admitting: *Deleted

## 2014-11-19 NOTE — Telephone Encounter (Signed)
Pt came into office stating needs a new prescription for his CPAP Mask d/t his new insurance [employer] he has to have new script, [facility]  Pt also requesting new script for viagra 100mg   [facility] [place]

## 2014-11-20 MED ORDER — SILDENAFIL CITRATE 100 MG PO TABS: 50.0000 mg | ORAL_TABLET | Freq: Every day | ORAL | Status: DC | PRN

## 2014-11-20 MED ORDER — UNABLE TO FIND: Status: DC

## 2014-11-20 NOTE — Telephone Encounter (Signed)
[facility]

## 2014-11-20 NOTE — Telephone Encounter (Signed)
Pt needed written rxs for mask and med - rx's done and on wtp desk for sig and pt aware he can pick up tom

## 2015-01-16 ENCOUNTER — Encounter: Payer: Self-pay | Admitting: Family Medicine

## 2015-01-16 ENCOUNTER — Ambulatory Visit (INDEPENDENT_AMBULATORY_CARE_PROVIDER_SITE_OTHER): Payer: 59 | Admitting: Family Medicine

## 2015-01-16 VITALS — BP 110/68 | HR 92 | Temp 98.3°F | Resp 18 | Wt 241.0 lb

## 2015-01-16 DIAGNOSIS — R251 Tremor, unspecified: Secondary | ICD-10-CM | POA: Diagnosis not present

## 2015-01-16 DIAGNOSIS — M5010 Cervical disc disorder with radiculopathy, unspecified cervical region: Secondary | ICD-10-CM | POA: Diagnosis not present

## 2015-01-16 MED ORDER — PREDNISONE 20 MG PO TABS: ORAL_TABLET | ORAL | Status: DC

## 2015-01-16 MED ORDER — TRAMADOL HCL 50 MG PO TABS: 50.0000 mg | ORAL_TABLET | Freq: Four times a day (QID) | ORAL | Status: DC | PRN

## 2015-01-16 NOTE — Progress Notes (Signed)
Subjective:    Patient ID: Kyle Campbell, male    DOB: 1960-07-29, 54 y.o.   MRN: [id number]  HPI Patient presents with neck pain.  Had MRI of Cspine in 12/15 which revealed:  C6-7 broad-based protrusion with greatest extension left posterior lateral position with mild caudal extension. Disc extends into the left neural foramen. Disc also crosses midline to the right. Spinal stenosis and cord flattening greater on left. Moderate left foraminal narrowing.  C5-6 broad-based disc osteophyte complex greater to the right. Spinal stenosis and mild cord flattening greater on the right. Uncinate hypertrophy greater on the right with mild to moderate right-sided and minimal left-sided foraminal narrowing.  C3-4 bulge greatest centrally with narrowing of the ventral aspect of the thecal sac and minimal cord contact. Minimal right foraminal narrowing. Minimal narrowing of the origin the right neural foramen.  C4-5 minimal bulge. Minimal narrowing ventral aspect of thecal sac.   At that time, the patient was having pain in his neck radiating down his left arm all the way into his left index finger. However after a prednisone taper pack, the symptoms went away, and the patient elected not to proceed with surgery with Dr. Otelia Sergeant.  Patient has remained asymptomatic throughout this entire year. However recently, one week ago, the patient developed pain again in his neck radiating down his left arm to just below his left elbow. The pain is neuropathic in nature. He denies any numbness in his hands or weakness in his hands. However he has noticed a tremor in both hands which is worse with activity. For instance if he is trying to hold a plate still, his hand will start to shake moderately. At rest there is no tremor. This seems to be limited to activity. He is not sure if it is related to weakness in his hand or facility related to activity. To me it sounds like an intention tremor. He denies any  symptoms of Parkinson's disease. He denies any difficulty walking, falls, balance issues, masklike facies, or memory problems. He denies any depression. He also denies any falls or hyperreflexia. This seems to be relegated to both arms. Past Medical History  Diagnosis Date  . Asthma   . Hyperlipidemia   . GERD (gastroesophageal reflux disease)   . Erectile dysfunction   . Obstructive sleep apnea     compliant with CPAP  . Diabetes mellitus without complication   . Testosterone deficiency    Past Surgical History  Procedure Laterality Date  . Tonsillectomy      age 57   Current Outpatient Prescriptions on File Prior to Visit  Medication Sig Dispense Refill  . Fluticasone-Salmeterol (ADVAIR DISKUS) 250-50 MCG/DOSE AEPB INHALE ONE PUFF BY MOUTH AS DIRECTED 60 each 3  . metFORMIN (GLUCOPHAGE) 1000 MG tablet TAKE ONE TABLET BY MOUTH TWICE A DAY WITH MEALS  60 tablet 2  . PROAIR HFA 108 (90 BASE) MCG/ACT inhaler Inhale one puff by mouth every four hours as needed 8.5 g PRN  . sildenafil (VIAGRA) 100 MG tablet Take 0.5-1 tablets (50-100 mg total) by mouth daily as needed for erectile dysfunction. 10 tablet 11  . SYRINGE-NEEDLE, DISP, 3 ML (BD INTEGRA SYRINGE) 25G X 1" 3 ML MISC USE WITH TESTOSTERONE EVERY 2 WEEKS 50 each 0  . tamsulosin (FLOMAX) 0.4 MG CAPS capsule Take 1 capsule (0.4 mg total) by mouth daily. 30 capsule 3  . testosterone cypionate (DEPOTESTOTERONE CYPIONATE) 200 MG/ML injection INJECT INTRAMUSCULARLY EVERY 14 DAYS 10 mL 0  .  UNABLE TO FIND CPAP mask - DX: OSA G47.33 1 Device 0  . vardenafil (LEVITRA) 20 MG tablet Take 20 mg by mouth as needed.       No current facility-administered medications on file prior to visit.   No Known Allergies Social History   Social History  . Marital Status: Married    Spouse Name: N/A  . Number of Children: N/A  . Years of Education: N/A   Occupational History  . Not on file.   Social History Main Topics  . Smoking status: Never  Smoker   . Smokeless tobacco: Never Used  . Alcohol Use: 0.5 oz/week    1 drink(s) per week  . Drug Use: No  . Sexual Activity: Yes     Comment: Married.     Other Topics Concern  . Not on file   Social History Narrative   Has 3 children. One 43 year, and 52 year old twins. 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      Review of Systems  All other systems reviewed and are negative.      Objective:   Physical Exam  Cardiovascular: Normal rate, regular rhythm and normal heart sounds.   Pulmonary/Chest: Effort normal and breath sounds normal. No respiratory distress. He has no wheezes. He has no rales.  Musculoskeletal:       Cervical back: He exhibits decreased range of motion, tenderness and pain. He exhibits no bony tenderness.  Neurological: He has normal strength. He displays no atrophy and no tremor. No cranial nerve deficit or sensory deficit. He exhibits normal muscle tone.  Reflex Scores:      Tricep reflexes are 2+ on the right side and 2+ on the left side.      Bicep reflexes are 2+ on the right side and 2+ on the left side.      Brachioradialis reflexes are 2+ on the right side and 2+ on the left side. Vitals reviewed.         Assessment & Plan:  Cervical disc disorder with radiculopathy of cervical region - Plan: predniSONE (DELTASONE) 20 MG tablet, traMADol (ULTRAM) 50 MG tablet  Patient certainly has cervical radiculopathy. I will treat this with a prednisone taper pack to try to ease the swelling and what I suspect is the bulging disc between the sixth and seventh cervical vertebrae that was present last fall. I will also give the patient tramadol to take for neck pain to allow him to sleep. If symptoms are not improving, I would ask the patient follow-up with Dr. Otelia Sergeant.  The tremor in both hands, at this time, sounds like an essential tremor in that it occurs bilaterally, involves the hands, seems to be postural or action based. Furthermore there is no other  neurologic deficits associated with it such as bradykinesia, rigidity, or balance issues. However I would also be concerned that it may be related to nerve impingement giving the bilateral nature of his herniated disc in his neck. Patient is going to monitor for worsening or the occurrence of weakness.  If this occurs, it would prompt more rapid referral back to his back surgeon. He is also going to monitor for other neurologic deficit such as bradykinesia, etc. If so this would prompt a referral to a neurologist here at the present time however we will monitor clinically.

## 2015-03-17 ENCOUNTER — Ambulatory Visit: Payer: BLUE CROSS/BLUE SHIELD | Admitting: Family Medicine

## 2015-03-24 ENCOUNTER — Ambulatory Visit: Payer: 59 | Admitting: Family Medicine

## 2015-03-28 ENCOUNTER — Other Ambulatory Visit: Payer: 59

## 2015-03-28 DIAGNOSIS — Z79899 Other long term (current) drug therapy: Secondary | ICD-10-CM

## 2015-03-28 DIAGNOSIS — E785 Hyperlipidemia, unspecified: Secondary | ICD-10-CM

## 2015-03-28 DIAGNOSIS — E119 Type 2 diabetes mellitus without complications: Secondary | ICD-10-CM

## 2015-03-28 LAB — LIPID PANEL
CHOL/HDL RATIO: 4.7 ratio (ref <=5.0)
Cholesterol: 149 mg/dL (ref 125–200)
HDL: 32 mg/dL — ABNORMAL LOW (ref >=40)
LDL Cholesterol: 78 mg/dL (ref <130)
Triglycerides: 197 mg/dL — ABNORMAL HIGH (ref <150)
VLDL: 39 mg/dL — AB (ref <30)

## 2015-03-28 LAB — COMPLETE METABOLIC PANEL WITH GFR
ALBUMIN: 4.2 g/dL (ref 3.6–5.1)
ALK PHOS: 80 U/L (ref 40–115)
ALT: 70 U/L — AB (ref 9–46)
AST: 38 U/L — AB (ref 10–35)
BILIRUBIN TOTAL: 0.5 mg/dL (ref 0.2–1.2)
BUN: 11 mg/dL (ref 7–25)
CALCIUM: 9.2 mg/dL (ref 8.6–10.3)
CO2: 29 mmol/L (ref 20–31)
CREATININE: 1.01 mg/dL (ref 0.70–1.33)
Chloride: 101 mmol/L (ref 98–110)
GFR, Est African American: >89 mL/min (ref >=60)
GFR, Est Non African American: 85 mL/min (ref >=60)
GLUCOSE: 137 mg/dL — AB (ref 70–99)
Potassium: 4.5 mmol/L (ref 3.5–5.3)
Sodium: 138 mmol/L (ref 135–146)
TOTAL PROTEIN: 6.4 g/dL (ref 6.1–8.1)

## 2015-03-28 LAB — HEMOGLOBIN A1C
Hgb A1c MFr Bld: 7.1 % — ABNORMAL HIGH (ref <5.7)
Mean Plasma Glucose: 157 mg/dL — ABNORMAL HIGH (ref <117)

## 2015-03-29 LAB — CBC WITH DIFFERENTIAL/PLATELET
BASOS ABS: 0.1 10*3/uL (ref 0.0–0.1)
Basophils Relative: 1 % (ref 0–1)
EOS ABS: 0.3 10*3/uL (ref 0.0–0.7)
EOS PCT: 5 % (ref 0–5)
HCT: 43.2 % (ref 39.0–52.0)
Hemoglobin: 15.2 g/dL (ref 13.0–17.0)
LYMPHS ABS: 2 10*3/uL (ref 0.7–4.0)
Lymphocytes Relative: 32 % (ref 12–46)
MCH: 32.7 pg (ref 26.0–34.0)
MCHC: 35.2 g/dL (ref 30.0–36.0)
MCV: 92.9 fL (ref 78.0–100.0)
MPV: 9.9 fL (ref 8.6–12.4)
Monocytes Absolute: 0.4 10*3/uL (ref 0.1–1.0)
Monocytes Relative: 7 % (ref 3–12)
Neutro Abs: 3.4 10*3/uL (ref 1.7–7.7)
Neutrophils Relative %: 55 % (ref 43–77)
PLATELETS: 157 10*3/uL (ref 150–400)
RBC: 4.65 MIL/uL (ref 4.22–5.81)
RDW: 13.5 % (ref 11.5–15.5)
WBC: 6.2 10*3/uL (ref 4.0–10.5)

## 2015-04-08 ENCOUNTER — Ambulatory Visit (INDEPENDENT_AMBULATORY_CARE_PROVIDER_SITE_OTHER): Payer: 59 | Admitting: Family Medicine

## 2015-04-08 ENCOUNTER — Encounter: Payer: Self-pay | Admitting: Family Medicine

## 2015-04-08 VITALS — BP 132/72 | HR 76 | Temp 98.5°F | Resp 18 | Ht 73.0 in | Wt 242.0 lb

## 2015-04-08 DIAGNOSIS — Z23 Encounter for immunization: Secondary | ICD-10-CM

## 2015-04-08 DIAGNOSIS — E291 Testicular hypofunction: Secondary | ICD-10-CM

## 2015-04-08 DIAGNOSIS — M5010 Cervical disc disorder with radiculopathy, unspecified cervical region: Secondary | ICD-10-CM | POA: Diagnosis not present

## 2015-04-08 DIAGNOSIS — E1165 Type 2 diabetes mellitus with hyperglycemia: Secondary | ICD-10-CM

## 2015-04-08 DIAGNOSIS — IMO0001 Reserved for inherently not codable concepts without codable children: Secondary | ICD-10-CM

## 2015-04-08 DIAGNOSIS — N400 Enlarged prostate without lower urinary tract symptoms: Secondary | ICD-10-CM

## 2015-04-08 DIAGNOSIS — R945 Abnormal results of liver function studies: Secondary | ICD-10-CM

## 2015-04-08 DIAGNOSIS — R7989 Other specified abnormal findings of blood chemistry: Secondary | ICD-10-CM

## 2015-04-08 MED ORDER — TRAMADOL HCL 50 MG PO TABS: 50.0000 mg | ORAL_TABLET | Freq: Four times a day (QID) | ORAL | Status: DC | PRN

## 2015-04-08 NOTE — Progress Notes (Signed)
Subjective:    Patient ID: Kyle Campbell, male    DOB: Aug 06, 1960, 54 y.o.   MRN: [id number]  HPI  09/12/14 Please see my last office visit, HgA1c was 8.2.  He has lost 7 lbs through diet, exercise, and weight loss.  HgA1c has fallen to 7.0.  He does report 3-4 episodes of nocturia at night, hesitancy, and weak stream.  At that time, my plan was: Continue to work on diet, exercise and weight loss.  Add Januvia 50 mg poqday and recheck in 6 months.  Try flomax 0.4 mg poqhs for possible BPH although PSA and prostate exams have been nml.  04/08/15 Here today for follow up.  He stopped Januvia after 1 month. His weight is essentially unchanged. He is not exercising. He is using tramadol in the mornings to help with neck pain. I recommended against using tramadol and driving. He can certainly take it at night to help him sleep due to cervical degenerative disc disease. He is interested in pursuing surgery next year. He cannot financially afford it at the present time. He recently went to [facility] and he does admit to drinking more alcohol which may explain the slight elevation in his liver function test. He also admits to eating a fatty diet and not exercising. He denies any chest pain should this of breath or dyspnea on exertion. Lab on 03/28/2015  Component Date Value Ref Range Status  . Sodium 03/28/2015 138  135 - 146 mmol/L Final  . Potassium 03/28/2015 4.5  3.5 - 5.3 mmol/L Final  . Chloride 03/28/2015 101  98 - 110 mmol/L Final  . CO2 03/28/2015 29  20 - 31 mmol/L Final  . Glucose, Bld 03/28/2015 137* 70 - 99 mg/dL Final  . BUN 03/28/2015 11  7 - 25 mg/dL Final  . Creat 03/28/2015 1.01  0.70 - 1.33 mg/dL Final  . Total Bilirubin 03/28/2015 0.5  0.2 - 1.2 mg/dL Final  . Alkaline Phosphatase 03/28/2015 80  40 - 115 U/L Final  . AST 03/28/2015 38* 10 - 35 U/L Final  . ALT 03/28/2015 70* 9 - 46 U/L Final  . Total Protein 03/28/2015 6.4  6.1 - 8.1 g/dL Final  . Albumin 03/28/2015 4.2  3.6 - 5.1  g/dL Final  . Calcium 03/28/2015 9.2  8.6 - 10.3 mg/dL Final  . GFR, Est African American 03/28/2015 >89  >=60 mL/min Final  . GFR, Est Non African American 03/28/2015 85  >=60 mL/min Final   Comment:   The estimated GFR is a calculation valid for adults (>=65 years old) that uses the CKD-EPI algorithm to adjust for age and sex. It is   not to be used for children, pregnant women, hospitalized patients,    patients on dialysis, or with rapidly changing kidney function. According to the NKDEP, eGFR >89 is normal, 60-89 shows mild impairment, 30-59 shows moderate impairment, 15-29 shows severe impairment and <15 is ESRD.     Marland Kitchen Cholesterol 03/28/2015 149  125 - 200 mg/dL Final  . Triglycerides 03/28/2015 197* <150 mg/dL Final  . HDL 03/28/2015 32* >=40 mg/dL Final  . Total CHOL/HDL Ratio 03/28/2015 4.7  <=5.0 Ratio Final  . VLDL 03/28/2015 39* <30 mg/dL Final  . LDL Cholesterol 03/28/2015 78  <130 mg/dL Final   Comment:   Total Cholesterol/HDL Ratio:CHD Risk                        Coronary Heart Disease Risk Table  Men       Women          1/2 Average Risk              3.4        3.3              Average Risk              5.0        4.4           2X Average Risk              9.6        7.1           3X Average Risk             23.4       11.0 Use the calculated Patient Ratio above and the CHD Risk table  to determine the patient's CHD Risk.   . WBC 03/28/2015 6.2  4.0 - 10.5 K/uL Final  . RBC 03/28/2015 4.65  4.22 - 5.81 MIL/uL Final  . Hemoglobin 03/28/2015 15.2  13.0 - 17.0 g/dL Final  . HCT 03/28/2015 43.2  39.0 - 52.0 % Final  . MCV 03/28/2015 92.9  78.0 - 100.0 fL Final  . MCH 03/28/2015 32.7  26.0 - 34.0 pg Final  . MCHC 03/28/2015 35.2  30.0 - 36.0 g/dL Final  . RDW 03/28/2015 13.5  11.5 - 15.5 % Final  . Platelets 03/28/2015 157  150 - 400 K/uL Final  . MPV 03/28/2015 9.9  8.6 - 12.4 fL Final  . Neutrophils Relative % 03/28/2015 55   43 - 77 % Final  . Neutro Abs 03/28/2015 3.4  1.7 - 7.7 K/uL Final  . Lymphocytes Relative 03/28/2015 32  12 - 46 % Final  . Lymphs Abs 03/28/2015 2.0  0.7 - 4.0 K/uL Final  . Monocytes Relative 03/28/2015 7  3 - 12 % Final  . Monocytes Absolute 03/28/2015 0.4  0.1 - 1.0 K/uL Final  . Eosinophils Relative 03/28/2015 5  0 - 5 % Final  . Eosinophils Absolute 03/28/2015 0.3  0.0 - 0.7 K/uL Final  . Basophils Relative 03/28/2015 1  0 - 1 % Final  . Basophils Absolute 03/28/2015 0.1  0.0 - 0.1 K/uL Final  . Smear Review 03/28/2015 SEE NOTE   Final   Atypical lymphs.  . Hgb A1c MFr Bld 03/28/2015 7.1* <5.7 % Final   Comment:                                                                        According to the ADA Clinical Practice Recommendations for 2011, when HbA1c is used as a screening test:     >=6.5%   Diagnostic of Diabetes Mellitus            (if abnormal result is confirmed)   5.7-6.4%   Increased risk of developing Diabetes Mellitus   References:Diagnosis and Classification of Diabetes Mellitus,Diabetes ALPF,7902,40(XBDZH 1):S62-S69 and Standards of Medical Care in         Diabetes - 2011,Diabetes GDJM,4268,34 (Suppl 1):S11-S61.     . Mean Plasma Glucose 03/28/2015 157* <117 mg/dL Final   Past Medical History  Diagnosis Date  . Asthma   . Hyperlipidemia   . GERD (gastroesophageal reflux disease)   . Erectile dysfunction   . Obstructive sleep apnea     compliant with CPAP  . Diabetes mellitus without complication   . Testosterone deficiency    Past Surgical History  Procedure Laterality Date  . Tonsillectomy      age 28   Current Outpatient Prescriptions on File Prior to Visit  Medication Sig Dispense Refill  . Fluticasone-Salmeterol (ADVAIR DISKUS) 250-50 MCG/DOSE AEPB INHALE ONE PUFF BY MOUTH AS DIRECTED 60 each 3  . metFORMIN (GLUCOPHAGE) 1000 MG tablet TAKE ONE TABLET BY MOUTH TWICE A DAY WITH MEALS  60 tablet 2  . predniSONE (DELTASONE) 20 MG tablet 3 tabs  poqday 1-2, 2 tabs poqday 3-4, 1 tab poqday 5-6 12 tablet 0  . PROAIR HFA 108 (90 BASE) MCG/ACT inhaler Inhale one puff by mouth every four hours as needed 8.5 g PRN  . sildenafil (VIAGRA) 100 MG tablet Take 0.5-1 tablets (50-100 mg total) by mouth daily as needed for erectile dysfunction. 10 tablet 11  . SYRINGE-NEEDLE, DISP, 3 ML (BD INTEGRA SYRINGE) 25G X 1" 3 ML MISC USE WITH TESTOSTERONE EVERY 2 WEEKS 50 each 0  . tamsulosin (FLOMAX) 0.4 MG CAPS capsule Take 1 capsule (0.4 mg total) by mouth daily. 30 capsule 3  . testosterone cypionate (DEPOTESTOTERONE CYPIONATE) 200 MG/ML injection INJECT 1ML INTRAMUSCULARLY EVERY 14 DAYS 10 mL 0  . traMADol (ULTRAM) 50 MG tablet Take 1 tablet (50 mg total) by mouth every 6 (six) hours as needed. 90 tablet 0  . UNABLE TO FIND CPAP mask - DX: OSA G47.33 1 Device 0  . vardenafil (LEVITRA) 20 MG tablet Take 20 mg by mouth as needed.       No current facility-administered medications on file prior to visit.   No Known Allergies Social History   Social History  . Marital Status: Married    Spouse Name: N/A  . Number of Children: N/A  . Years of Education: N/A   Occupational History  . Not on file.   Social History Main Topics  . Smoking status: Never Smoker   . Smokeless tobacco: Never Used  . Alcohol Use: 0.5 oz/week    1 drink(s) per week  . Drug Use: No  . Sexual Activity: Yes     Comment: Married.     Other Topics Concern  . Not on file   Social History Narrative   Has 3 children. One 3 year, and 62 year old twins. Works as Administrator. Occasional  ETOH.     Review of Systems  All other systems reviewed and are negative.      Objective:   Physical Exam  Cardiovascular: Normal rate, regular rhythm and normal heart sounds.   Pulmonary/Chest: Effort normal and breath sounds normal. No respiratory distress. He has no wheezes. He has no rales.  Abdominal: Soft. Bowel sounds are normal. He exhibits no distension. There is no  tenderness. There is no rebound and no guarding.  Musculoskeletal: He exhibits no edema.          Assessment & Plan:    Uncontrolled type 2 diabetes mellitus without complication, without long-term current use of insulin (HCC)  Hypogonadism in male  BPH (benign prostatic hyperplasia)  Need for prophylactic vaccination and inoculation against influenza - Plan: Flu Vaccine QUAD 36+ mos IM  Cervical disc disorder with radiculopathy of cervical region - Plan: traMADol (ULTRAM) 50 MG tablet  Elevated LFTs  I had a long discussion with the patient regarding his diabetes. I would like his hemoglobin A1c to be less than 6.5. Therefore I will start the patient on invokana 300 mg by mouth daily. Hopefully this will help him achieve his A1c goal and also facilitate some weight loss. I would really like the patient to try to lose 10-15 pounds by his next office visit. I recommended 20-30 minutes a day of aerobic exercise and a low saturated fat low-carb hydrate diet. I would like the patient to avoid all alcohol, decrease his consumption of cholesterol and saturated fat and return for a CMP in one month to monitor his liver function test. I believe his elevation in liver function tests likely secondary to alcohol intake coupled with mild fatty liver disease. Recheck CMP in one month.  I also refilled the patient's tramadol. He uses one pill a day. 90 pills last him 3-4 months.

## 2015-05-01 ENCOUNTER — Other Ambulatory Visit: Payer: 59

## 2015-05-01 DIAGNOSIS — R945 Abnormal results of liver function studies: Principal | ICD-10-CM

## 2015-05-01 DIAGNOSIS — E1165 Type 2 diabetes mellitus with hyperglycemia: Secondary | ICD-10-CM

## 2015-05-01 DIAGNOSIS — R7989 Other specified abnormal findings of blood chemistry: Secondary | ICD-10-CM

## 2015-05-01 DIAGNOSIS — IMO0001 Reserved for inherently not codable concepts without codable children: Secondary | ICD-10-CM

## 2015-05-01 LAB — COMPLETE METABOLIC PANEL WITH GFR
ALBUMIN: 4.2 g/dL (ref 3.6–5.1)
ALT: 63 U/L — AB (ref 9–46)
AST: 34 U/L (ref 10–35)
Alkaline Phosphatase: 73 U/L (ref 40–115)
BUN: 14 mg/dL (ref 7–25)
CALCIUM: 8.9 mg/dL (ref 8.6–10.3)
CHLORIDE: 102 mmol/L (ref 98–110)
CO2: 22 mmol/L (ref 20–31)
CREATININE: 1.14 mg/dL (ref 0.70–1.33)
GFR, Est African American: 85 mL/min (ref >=60)
GFR, Est Non African American: 74 mL/min (ref >=60)
Glucose, Bld: 178 mg/dL — ABNORMAL HIGH (ref 70–99)
Potassium: 4.3 mmol/L (ref 3.5–5.3)
Sodium: 138 mmol/L (ref 135–146)
TOTAL PROTEIN: 6.5 g/dL (ref 6.1–8.1)
Total Bilirubin: 0.4 mg/dL (ref 0.2–1.2)

## 2015-05-02 ENCOUNTER — Encounter: Payer: Self-pay | Admitting: Family Medicine

## 2015-05-07 ENCOUNTER — Encounter: Payer: Self-pay | Admitting: Gastroenterology

## 2015-05-08 ENCOUNTER — Other Ambulatory Visit: Payer: 59

## 2015-05-16 ENCOUNTER — Other Ambulatory Visit: Payer: Self-pay | Admitting: Family Medicine

## 2015-05-16 NOTE — Telephone Encounter (Signed)
Medication called to pharmacy. 

## 2015-05-16 NOTE — Telephone Encounter (Signed)
Ok to refill??  Last office visit/ refill 04/08/2015.

## 2015-05-16 NOTE — Telephone Encounter (Signed)
ok 

## 2015-06-12 ENCOUNTER — Telehealth: Payer: Self-pay | Admitting: Family Medicine

## 2015-06-12 NOTE — Telephone Encounter (Signed)
Pt would like a written prescription for a c-pap machine. [phone]

## 2015-06-16 MED ORDER — UNABLE TO FIND: Status: AC

## 2015-06-16 NOTE — Telephone Encounter (Signed)
ok 

## 2015-06-16 NOTE — Telephone Encounter (Signed)
Prescription printed.   Call placed to patient. LMTRC.  

## 2015-06-16 NOTE — Telephone Encounter (Signed)
Received return call from patient.   Reports that he only requires CPAP mask.   Prescription printed and patient made aware to come to office to pick up on 06/17/2015.

## 2015-06-16 NOTE — Telephone Encounter (Signed)
Ok to order 

## 2015-07-02 ENCOUNTER — Telehealth: Payer: Self-pay | Admitting: Family Medicine

## 2015-07-02 NOTE — Telephone Encounter (Signed)
Requesting a refill on Tramadol - LOV - 04/08/15 - LRF - 05/16/15 -- ? OK to Refill

## 2015-07-03 ENCOUNTER — Other Ambulatory Visit: Payer: Self-pay | Admitting: Family Medicine

## 2015-07-03 MED ORDER — TRAMADOL HCL 50 MG PO TABS: 50.0000 mg | ORAL_TABLET | Freq: Four times a day (QID) | ORAL | Status: DC | PRN

## 2015-07-03 NOTE — Telephone Encounter (Signed)
Medication called/sent to requested pharmacy  

## 2015-07-03 NOTE — Telephone Encounter (Signed)
ok 

## 2015-07-08 ENCOUNTER — Encounter: Payer: Self-pay | Admitting: Family Medicine

## 2015-07-08 ENCOUNTER — Ambulatory Visit (INDEPENDENT_AMBULATORY_CARE_PROVIDER_SITE_OTHER): Payer: 59 | Admitting: Family Medicine

## 2015-07-08 VITALS — BP 126/72 | HR 72 | Temp 98.6°F | Resp 16 | Wt 244.0 lb

## 2015-07-08 DIAGNOSIS — M5432 Sciatica, left side: Secondary | ICD-10-CM | POA: Diagnosis not present

## 2015-07-08 MED ORDER — PREDNISONE 20 MG PO TABS: ORAL_TABLET | ORAL | Status: DC

## 2015-07-08 MED ORDER — TRAMADOL HCL 50 MG PO TABS: 50.0000 mg | ORAL_TABLET | Freq: Four times a day (QID) | ORAL | Status: DC | PRN

## 2015-07-08 NOTE — Progress Notes (Signed)
Subjective:    Patient ID: Kyle Campbell, male    DOB: 12/18/1960, 55 y.o.   MRN: [id number]  HPI  Patient reports a one-month history of pain in his lower back radiating into his left posterior hip down his left leg into his left shin. He also reports numbness and tingling in his left shin. He reports some mild weakness in his left leg. Periodically he feels like his left leg wants to vocal underneath him. He has a difficult time walking up steps due to the weakness.  Past Medical History  Diagnosis Date  . Asthma   . Hyperlipidemia   . GERD (gastroesophageal reflux disease)   . Erectile dysfunction   . Obstructive sleep apnea     compliant with CPAP  . Diabetes mellitus without complication (HCC)   . Testosterone deficiency    Past Surgical History  Procedure Laterality Date  . Tonsillectomy      age 66   Current Outpatient Prescriptions on File Prior to Visit  Medication Sig Dispense Refill  . Fluticasone-Salmeterol (ADVAIR DISKUS) 250-50 MCG/DOSE AEPB INHALE ONE PUFF BY MOUTH AS DIRECTED 60 each 3  . metFORMIN (GLUCOPHAGE) 1000 MG tablet TAKE ONE TABLET BY MOUTH TWICE A DAY WITH MEALS  60 tablet 2  . PROAIR HFA 108 (90 BASE) MCG/ACT inhaler Inhale one puff by mouth every four hours as needed 8.5 g PRN  . sildenafil (VIAGRA) 100 MG tablet Take 0.5-1 tablets (50-100 mg total) by mouth daily as needed for erectile dysfunction. 10 tablet 11  . SYRINGE-NEEDLE, DISP, 3 ML (BD INTEGRA SYRINGE) 25G X 1" 3 ML MISC USE WITH TESTOSTERONE EVERY 2 WEEKS 50 each 0  . tamsulosin (FLOMAX) 0.4 MG CAPS capsule TAKE 1 CAPSULE BY MOUTH ONCE DAILY 30 capsule 1  . testosterone cypionate (DEPOTESTOTERONE CYPIONATE) 200 MG/ML injection INJECT INTRAMUSCULARLY EVERY 14 DAYS 10 mL 0  . traMADol (ULTRAM) 50 MG tablet TAKE 1 TABLET BY MOUTH EVERY 6 HOURS AS NEEDED 90 tablet 0  . UNABLE TO FIND CPAP mask - DX: OSA G47.33 1 Device 0   No current facility-administered medications on file prior to  visit.   No Known Allergies Social History   Social History  . Marital Status: Married    Spouse Name: N/A  . Number of Children: N/A  . Years of Education: N/A   Occupational History  . Not on file.   Social History Main Topics  . Smoking status: Never Smoker   . Smokeless tobacco: Never Used  . Alcohol Use: 0.5 oz/week    1 drink(s) per week  . Drug Use: No  . Sexual Activity: Yes     Comment: Married.     Other Topics Concern  . Not on file   Social History Narrative   Has 3 children. One 56 year, and 90 year old twins. 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     Review of Systems  All other systems reviewed and are negative.      Objective:   Physical Exam  Cardiovascular: Normal rate, regular rhythm and normal heart sounds.   Pulmonary/Chest: Effort normal and breath sounds normal. No respiratory distress. He has no wheezes. He has no rales.  Abdominal: Soft. Bowel sounds are normal. He exhibits no distension. There is no tenderness. There is no rebound and no guarding.  Musculoskeletal: He exhibits no edema.       Lumbar back: He exhibits decreased range of motion and tenderness. He exhibits no  bony tenderness.  Neurological: He has normal reflexes. He exhibits normal muscle tone. Coordination normal.          Assessment & Plan:    Left sided sciatica - Plan: traMADol (ULTRAM) 50 MG tablet, predniSONE (DELTASONE) 20 MG tablet  Patient has sciatica. Begin a prednisone taper pack along with Ultram 50 mg by mouth every 8 hours when necessary pain. Recheck in one week. If no better proceed with an x-ray of the lumbar spine.

## 2015-08-26 ENCOUNTER — Other Ambulatory Visit: Payer: Self-pay | Admitting: Family Medicine

## 2015-08-26 DIAGNOSIS — M5432 Sciatica, left side: Secondary | ICD-10-CM

## 2015-08-26 NOTE — Telephone Encounter (Signed)
[facility] [address]  Patient would like refill on tramadol  [phone] (H)

## 2015-08-27 NOTE — Telephone Encounter (Signed)
Ok to refill??  Last refill:  07/08/2015

## 2015-08-28 MED ORDER — TRAMADOL HCL 50 MG PO TABS: 50.0000 mg | ORAL_TABLET | Freq: Four times a day (QID) | ORAL | Status: DC | PRN

## 2015-08-28 NOTE — Telephone Encounter (Signed)
Medication called to pharmacy. 

## 2015-08-28 NOTE — Telephone Encounter (Signed)
ok 

## 2015-09-01 ENCOUNTER — Other Ambulatory Visit: Payer: Self-pay | Admitting: Family Medicine

## 2015-09-01 ENCOUNTER — Other Ambulatory Visit: Payer: 59

## 2015-09-01 DIAGNOSIS — E1165 Type 2 diabetes mellitus with hyperglycemia: Secondary | ICD-10-CM | POA: Diagnosis not present

## 2015-09-01 DIAGNOSIS — IMO0001 Reserved for inherently not codable concepts without codable children: Secondary | ICD-10-CM

## 2015-09-01 DIAGNOSIS — Z79899 Other long term (current) drug therapy: Secondary | ICD-10-CM

## 2015-09-01 DIAGNOSIS — R945 Abnormal results of liver function studies: Principal | ICD-10-CM

## 2015-09-01 DIAGNOSIS — E119 Type 2 diabetes mellitus without complications: Secondary | ICD-10-CM | POA: Diagnosis not present

## 2015-09-01 DIAGNOSIS — R7989 Other specified abnormal findings of blood chemistry: Secondary | ICD-10-CM | POA: Diagnosis not present

## 2015-09-01 DIAGNOSIS — R799 Abnormal finding of blood chemistry, unspecified: Secondary | ICD-10-CM | POA: Diagnosis not present

## 2015-09-01 LAB — LIPID PANEL
CHOL/HDL RATIO: 6.1 ratio — AB (ref <=5.0)
CHOLESTEROL: 183 mg/dL (ref 125–200)
HDL: 30 mg/dL — ABNORMAL LOW (ref >=40)
LDL CALC: 78 mg/dL (ref <130)
TRIGLYCERIDES: 373 mg/dL — AB (ref <150)
VLDL: 75 mg/dL — AB (ref <30)

## 2015-09-01 LAB — COMPLETE METABOLIC PANEL WITH GFR
ALT: 58 U/L — AB (ref 9–46)
AST: 28 U/L (ref 10–35)
Albumin: 4.3 g/dL (ref 3.6–5.1)
Alkaline Phosphatase: 77 U/L (ref 40–115)
BUN: 11 mg/dL (ref 7–25)
CALCIUM: 8.8 mg/dL (ref 8.6–10.3)
CHLORIDE: 102 mmol/L (ref 98–110)
CO2: 28 mmol/L (ref 20–31)
CREATININE: 1.12 mg/dL (ref 0.70–1.33)
GFR, EST AFRICAN AMERICAN: 86 mL/min (ref >=60)
GFR, Est Non African American: 75 mL/min (ref >=60)
Glucose, Bld: 185 mg/dL — ABNORMAL HIGH (ref 70–99)
POTASSIUM: 4.4 mmol/L (ref 3.5–5.3)
Sodium: 139 mmol/L (ref 135–146)
TOTAL PROTEIN: 6.3 g/dL (ref 6.1–8.1)
Total Bilirubin: 0.4 mg/dL (ref 0.2–1.2)

## 2015-09-01 LAB — HEMOGLOBIN A1C
Hgb A1c MFr Bld: 8.5 % — ABNORMAL HIGH (ref <5.7)
Mean Plasma Glucose: 197 mg/dL

## 2015-09-05 ENCOUNTER — Ambulatory Visit (INDEPENDENT_AMBULATORY_CARE_PROVIDER_SITE_OTHER): Payer: 59 | Admitting: Family Medicine

## 2015-09-05 ENCOUNTER — Encounter: Payer: Self-pay | Admitting: Family Medicine

## 2015-09-05 VITALS — BP 126/80 | HR 86 | Temp 98.4°F | Resp 18 | Ht 73.0 in | Wt 242.0 lb

## 2015-09-05 DIAGNOSIS — E1100 Type 2 diabetes mellitus with hyperosmolarity without nonketotic hyperglycemic-hyperosmolar coma (NKHHC): Secondary | ICD-10-CM

## 2015-09-05 DIAGNOSIS — M5432 Sciatica, left side: Secondary | ICD-10-CM | POA: Diagnosis not present

## 2015-09-05 DIAGNOSIS — E785 Hyperlipidemia, unspecified: Secondary | ICD-10-CM | POA: Diagnosis not present

## 2015-09-05 DIAGNOSIS — N3281 Overactive bladder: Secondary | ICD-10-CM

## 2015-09-05 MED ORDER — TRAMADOL HCL 50 MG PO TABS: 50.0000 mg | ORAL_TABLET | Freq: Four times a day (QID) | ORAL | Status: DC | PRN

## 2015-09-05 NOTE — Progress Notes (Signed)
Subjective:    Patient ID: Kyle Campbell, male    DOB: Sep 13, 1960, 55 y.o.   MRN: [id number]  HPI At his last office visit, I'll try the patient on Flomax for nocturia for presumed BPH. Flomax did not help with his symptoms. His PSA last April was completely normal. I performed a rectal exam today and his prostate is not enlarged. There is no nodularity or asymmetry. Today the patient describes his urination is frequent urges to urinate but he does have a weak stream. He gets the sudden urge to urinate and sometimes he cannot make it to the bathroom in time. 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 He is here today to follow-up for his diabetes mellitus is well. He has not been consistently taking his metformin because of some diarrhea. His hemoglobin A1c has risen to 8.5. His triglycerides have risen from 192 more than 300 and his HDL cholesterol has dropped from 32-30. He is not exercising due to left-sided sciatica which she takes tramadol on an as-needed basis. He is also eating a lot of fast food and food from gas stations including hot dogs hamburgers and potato chips while he is on the road. Most recent lab work as listed below: Lab on 09/01/2015  Component Date Value Ref Range Status  . Sodium 09/01/2015 139  135 - 146 mmol/L Final  . Potassium 09/01/2015 4.4  3.5 - 5.3 mmol/L Final  . Chloride 09/01/2015 102  98 - 110 mmol/L Final  . CO2 09/01/2015 28  20 - 31 mmol/L Final  . Glucose, Bld 09/01/2015 185* 70 - 99 mg/dL Final  . BUN 09/01/2015 11  7 - 25 mg/dL Final  . Creat 09/01/2015 1.12  0.70 - 1.33 mg/dL Final  . Total Bilirubin 09/01/2015 0.4  0.2 - 1.2 mg/dL Final  . Alkaline Phosphatase 09/01/2015 77  40 - 115 U/L Final  . AST 09/01/2015 28  10 - 35 U/L Final  . ALT 09/01/2015 58* 9 - 46 U/L Final  . Total Protein 09/01/2015 6.3  6.1 - 8.1 g/dL Final  . Albumin 09/01/2015 4.3  3.6 - 5.1 g/dL Final  . Calcium 09/01/2015 8.8  8.6 - 10.3 mg/dL  Final  . GFR, Est African American 09/01/2015 86  >=60 mL/min Final  . GFR, Est Non African American 09/01/2015 75  >=60 mL/min Final   Comment:   The estimated GFR is a calculation valid for adults (>=70 years old) that uses the CKD-EPI algorithm to adjust for age and sex. It is   not to be used for children, pregnant women, hospitalized patients,    patients on dialysis, or with rapidly changing kidney function. According to the NKDEP, eGFR >89 is normal, 60-89 shows mild impairment, 30-59 shows moderate impairment, 15-29 shows severe impairment and <15 is ESRD.     Marland Kitchen Cholesterol 09/01/2015 183  125 - 200 mg/dL Final  . Triglycerides 09/01/2015 373* <150 mg/dL Final  . HDL 09/01/2015 30* >=40 mg/dL Final  . Total CHOL/HDL Ratio 09/01/2015 6.1* <=5.0 Ratio Final  . VLDL 09/01/2015 75* <30 mg/dL Final  . LDL Cholesterol 09/01/2015 78  <130 mg/dL Final   Comment:   Total Cholesterol/HDL Ratio:CHD Risk                        Coronary Heart Disease Risk Table  Men       Women          1/2 Average Risk              3.4        3.3              Average Risk              5.0        4.4           2X Average Risk              9.6        7.1           3X Average Risk             23.4       11.0 Use the calculated Patient Ratio above and the CHD Risk table  to determine the patient's CHD Risk.   . Hgb A1c MFr Bld 09/01/2015 8.5* <5.7 % Final   Comment:   For someone without known diabetes, a hemoglobin A1c value of 6.5% or greater indicates that they may have diabetes and this should be confirmed with a follow-up test.   For someone with known diabetes, a value <7% indicates that their diabetes is well controlled and a value greater than or equal to 7% indicates suboptimal control. A1c targets should be individualized based on duration of diabetes, age, comorbid conditions, and other considerations.   Currently, no consensus exists for use of  hemoglobin A1c for diagnosis of diabetes for children.     . Mean Plasma Glucose 09/01/2015 197   Final   Past Medical History  Diagnosis Date  . Asthma   . Hyperlipidemia   . GERD (gastroesophageal reflux disease)   . Erectile dysfunction   . Obstructive sleep apnea     compliant with CPAP  . Diabetes mellitus without complication ([place])   . Testosterone deficiency    Past Surgical History  Procedure Laterality Date  . Tonsillectomy      age 69   Current Outpatient Prescriptions on File Prior to Visit  Medication Sig Dispense Refill  . Fluticasone-Salmeterol (ADVAIR DISKUS) 250-50 MCG/DOSE AEPB INHALE ONE PUFF BY MOUTH AS DIRECTED 60 each 3  . metFORMIN (GLUCOPHAGE) 1000 MG tablet TAKE ONE TABLET BY MOUTH TWICE A DAY WITH MEALS  60 tablet 2  . PROAIR HFA 108 (90 BASE) MCG/ACT inhaler Inhale one puff by mouth every four hours as needed 8.5 g PRN  . sildenafil (VIAGRA) 100 MG tablet Take 0.5-1 tablets (50-100 mg total) by mouth daily as needed for erectile dysfunction. 10 tablet 11  . SYRINGE-NEEDLE, DISP, 3 ML (BD INTEGRA SYRINGE) 25G X 1" 3 ML MISC USE WITH TESTOSTERONE EVERY 2 WEEKS 50 each 0  . tamsulosin (FLOMAX) 0.4 MG CAPS capsule TAKE 1 CAPSULE BY MOUTH ONCE DAILY 30 capsule 1  . testosterone cypionate (DEPOTESTOTERONE CYPIONATE) 200 MG/ML injection INJECT 1ML INTRAMUSCULARLY EVERY 14 DAYS (Patient taking differently: INJECT 1ML INTRAMUSCULARLY EVERY 30 DAYS) 10 mL 0  . UNABLE TO FIND CPAP mask - DX: OSA G47.33 1 Device 0   No current facility-administered medications on file prior to visit.   No Known Allergies Social History   Social History  . Marital Status: Married    Spouse Name: N/A  . Number of Children: N/A  . Years of Education: N/A   Occupational History  . Not on file.   Social History Main  Topics  . Smoking status: Never Smoker   . Smokeless tobacco: Never Used  . Alcohol Use: 0.5 oz/week    1 drink(s) per week  . Drug Use: No  . Sexual Activity:  Yes     Comment: Married.     Other Topics Concern  . Not on file   Social History Narrative   Has 3 children. One 26 year, and 70 year old twins. Works as Administrator. Occasional  ETOH.       Review of Systems  All other systems reviewed and are negative.      Objective:   Physical Exam  Neck: Neck supple. No JVD present. No thyromegaly present.  Cardiovascular: Normal rate, regular rhythm, normal heart sounds and intact distal pulses.  Exam reveals no gallop and no friction rub.   No murmur heard. Pulmonary/Chest: Effort normal and breath sounds normal. No respiratory distress. He has no wheezes. He has no rales.  Abdominal: Soft. Bowel sounds are normal. He exhibits no distension. There is no tenderness. There is no rebound.  Genitourinary: Rectum normal and prostate normal.  Lymphadenopathy:    He has no cervical adenopathy.          Assessment & Plan:  Left sided sciatica - Plan: traMADol (ULTRAM) 50 MG tablet  Uncontrolled type 2 diabetes mellitus with hyperosmolarity without coma, without long-term current use of insulin (HCC)  Dyslipidemia  OAB (overactive bladder)  Patient is requesting a refill on tramadol. I just gave him a refill recently but he would like a handwritten prescription so he doesn't have to come up here to get his next prescription in 2 months for his convenience.  His diabetes is not controlled. I stressed compliance with the metformin. I will add jardiance 25 mg by mouth daily and recheck a fasting lipid panel and hemoglobin A1c in 3 months. I recommended significant therapeutic lifestyle changes including changing his diet considerably. He needs to avoid animal fat as much as possible. He needs to stay away from junk food so it gas stations. He needs try to eat more fresh fruits and vegetables. When he eats meat he needs to eat later meet such as [place] or fish that his grill not fried. I recommended avoiding carbohydrates is much as possible  including bread and potatoes chips. I recommended 30 minutes of exercise on a daily basis whenever his body will allow. I will also try him on myrbetriq 25 mg poqday for oab.

## 2015-09-06 LAB — TESTOSTERONE: TESTOSTERONE: 165 ng/dL — AB (ref 250–827)

## 2015-09-09 ENCOUNTER — Telehealth: Payer: Self-pay | Admitting: Family Medicine

## 2015-09-09 NOTE — Telephone Encounter (Signed)
It takes 4 weeks to reach affect. I do not expect weight loss immediately. Does he mean that he is having side effects?  I would not expect sugars to drop so quickly.

## 2015-09-09 NOTE — Telephone Encounter (Signed)
Pt wanted to let Dr. Tanya NonesPickard know that the Jardiance 25mg  does not seem to be working.  Please advise. [phone]

## 2015-09-10 MED ORDER — TESTOSTERONE CYPIONATE 200 MG/ML IM SOLN: INTRAMUSCULAR | Status: DC

## 2015-09-10 MED ORDER — "SYRINGE/NEEDLE (DISP) 25G X 1"" 3 ML MISC": Status: DC

## 2015-09-10 NOTE — Addendum Note (Signed)
Addended by: Legrand RamsWILLIS, SANDY B on: 09/10/2015 10:27 AM   Modules accepted: Orders

## 2015-09-10 NOTE — Telephone Encounter (Signed)
Spoke to pt and he was having some side effects from the medication - stomach pain and nausea but he was not sure if he had a virus or not so he is not sure which made him sick. Informed pt to try taking it again but take with something to eat and give it a few days and symptoms should decrease if it was the medication. If it does the same thing and pt can not tolerate informed him to call us back. Pt verbalized understanding.

## 2015-09-11 ENCOUNTER — Telehealth: Payer: Self-pay | Admitting: Family Medicine

## 2015-09-11 ENCOUNTER — Other Ambulatory Visit: Payer: Self-pay | Admitting: Family Medicine

## 2015-09-11 NOTE — Telephone Encounter (Signed)
PA called to [facility] for Testosterone injection - Approved from 09/10/16 - 09/10/16 - Pharm aware.

## 2015-09-11 NOTE — Telephone Encounter (Signed)
Refill appropriate and filled per protocol. 

## 2015-10-24 ENCOUNTER — Telehealth: Payer: Self-pay | Admitting: Family Medicine

## 2015-10-24 MED ORDER — EMPAGLIFLOZIN 25 MG PO TABS: 25.0000 mg | ORAL_TABLET | Freq: Every day | ORAL | Status: DC

## 2015-10-24 NOTE — Telephone Encounter (Signed)
Medication called/sent to requested pharmacy  

## 2015-10-24 NOTE — Telephone Encounter (Signed)
Patient would like a prescription for jardiance sent to [facility] if possible  [phone] if any questions

## 2015-11-06 ENCOUNTER — Other Ambulatory Visit: Payer: Self-pay | Admitting: Family Medicine

## 2015-11-07 NOTE — Telephone Encounter (Signed)
Ok to refill??        LRF - 09/05/15  LOV - 09/01/15

## 2015-11-07 NOTE — Telephone Encounter (Signed)
Okay to refill? 

## 2015-11-12 ENCOUNTER — Other Ambulatory Visit: Payer: Self-pay | Admitting: Family Medicine

## 2015-12-05 ENCOUNTER — Ambulatory Visit: Payer: 59 | Admitting: Family Medicine

## 2015-12-14 ENCOUNTER — Other Ambulatory Visit: Payer: Self-pay | Admitting: Family Medicine

## 2015-12-15 NOTE — Telephone Encounter (Signed)
Ok to refill 

## 2015-12-15 NOTE — Telephone Encounter (Signed)
Medication called to pharmacy. 

## 2015-12-15 NOTE — Telephone Encounter (Signed)
ok 

## 2016-01-02 ENCOUNTER — Ambulatory Visit: Payer: 59 | Admitting: Family Medicine

## 2016-01-13 ENCOUNTER — Other Ambulatory Visit: Payer: 59

## 2016-01-13 ENCOUNTER — Other Ambulatory Visit: Payer: Self-pay | Admitting: Family Medicine

## 2016-01-13 DIAGNOSIS — Z79899 Other long term (current) drug therapy: Secondary | ICD-10-CM | POA: Diagnosis not present

## 2016-01-13 DIAGNOSIS — E349 Endocrine disorder, unspecified: Secondary | ICD-10-CM

## 2016-01-13 DIAGNOSIS — E291 Testicular hypofunction: Secondary | ICD-10-CM | POA: Diagnosis not present

## 2016-01-13 DIAGNOSIS — E119 Type 2 diabetes mellitus without complications: Secondary | ICD-10-CM | POA: Diagnosis not present

## 2016-01-13 DIAGNOSIS — N3281 Overactive bladder: Secondary | ICD-10-CM | POA: Diagnosis not present

## 2016-01-13 DIAGNOSIS — E785 Hyperlipidemia, unspecified: Secondary | ICD-10-CM | POA: Diagnosis not present

## 2016-01-13 LAB — CBC WITH DIFFERENTIAL/PLATELET
BASOS PCT: 1 %
Basophils Absolute: 67 cells/uL (ref 0–200)
Eosinophils Absolute: 335 cells/uL (ref 15–500)
Eosinophils Relative: 5 %
HEMATOCRIT: 45.4 % (ref 38.5–50.0)
HEMOGLOBIN: 15.7 g/dL (ref 13.0–17.0)
LYMPHS ABS: 2010 {cells}/uL (ref 850–3900)
Lymphocytes Relative: 30 %
MCH: 31.8 pg (ref 27.0–33.0)
MCHC: 34.6 g/dL (ref 32.0–36.0)
MCV: 91.9 fL (ref 80.0–100.0)
MONO ABS: 469 {cells}/uL (ref 200–950)
MPV: 10.1 fL (ref 7.5–12.5)
Monocytes Relative: 7 %
NEUTROS ABS: 3819 {cells}/uL (ref 1500–7800)
NEUTROS PCT: 57 %
Platelets: 181 10*3/uL (ref 140–400)
RBC: 4.94 MIL/uL (ref 4.20–5.80)
RDW: 13.6 % (ref 11.0–15.0)
WBC: 6.7 10*3/uL (ref 3.8–10.8)

## 2016-01-13 LAB — COMPLETE METABOLIC PANEL WITH GFR
ALBUMIN: 4.1 g/dL (ref 3.6–5.1)
ALK PHOS: 67 U/L (ref 40–115)
ALT: 28 U/L (ref 9–46)
AST: 17 U/L (ref 10–35)
BILIRUBIN TOTAL: 0.4 mg/dL (ref 0.2–1.2)
BUN: 13 mg/dL (ref 7–25)
CO2: 26 mmol/L (ref 20–31)
Calcium: 8.8 mg/dL (ref 8.6–10.3)
Chloride: 103 mmol/L (ref 98–110)
Creat: 1.01 mg/dL (ref 0.70–1.33)
GFR, EST NON AFRICAN AMERICAN: 85 mL/min (ref >=60)
GLUCOSE: 140 mg/dL — AB (ref 70–99)
POTASSIUM: 4.5 mmol/L (ref 3.5–5.3)
SODIUM: 140 mmol/L (ref 135–146)
TOTAL PROTEIN: 6.4 g/dL (ref 6.1–8.1)

## 2016-01-13 LAB — LIPID PANEL
CHOLESTEROL: 165 mg/dL (ref 125–200)
HDL: 37 mg/dL — ABNORMAL LOW (ref >=40)
LDL Cholesterol: 81 mg/dL (ref <130)
TRIGLYCERIDES: 236 mg/dL — AB (ref <150)
Total CHOL/HDL Ratio: 4.5 Ratio (ref <=5.0)
VLDL: 47 mg/dL — ABNORMAL HIGH (ref <30)

## 2016-01-13 LAB — TSH: TSH: 1.36 m[IU]/L (ref 0.40–4.50)

## 2016-01-13 LAB — PSA: PSA: 0.6 ng/mL (ref <=4.0)

## 2016-01-14 LAB — TESTOSTERONE: TESTOSTERONE: 489 ng/dL (ref 250–827)

## 2016-01-16 LAB — HEMOGLOBIN A1C
Hgb A1c MFr Bld: 6.5 % — ABNORMAL HIGH (ref <5.7)
Mean Plasma Glucose: 140 mg/dL

## 2016-01-19 ENCOUNTER — Ambulatory Visit (INDEPENDENT_AMBULATORY_CARE_PROVIDER_SITE_OTHER): Payer: 59 | Admitting: Family Medicine

## 2016-01-19 ENCOUNTER — Encounter: Payer: Self-pay | Admitting: Family Medicine

## 2016-01-19 VITALS — BP 118/80 | HR 82 | Temp 98.4°F | Resp 18 | Wt 238.0 lb

## 2016-01-19 DIAGNOSIS — Z23 Encounter for immunization: Secondary | ICD-10-CM | POA: Diagnosis not present

## 2016-01-19 DIAGNOSIS — M5010 Cervical disc disorder with radiculopathy, unspecified cervical region: Secondary | ICD-10-CM | POA: Diagnosis not present

## 2016-01-19 DIAGNOSIS — E291 Testicular hypofunction: Secondary | ICD-10-CM | POA: Diagnosis not present

## 2016-01-19 DIAGNOSIS — E785 Hyperlipidemia, unspecified: Secondary | ICD-10-CM | POA: Diagnosis not present

## 2016-01-19 DIAGNOSIS — E119 Type 2 diabetes mellitus without complications: Secondary | ICD-10-CM

## 2016-01-19 DIAGNOSIS — R7989 Other specified abnormal findings of blood chemistry: Secondary | ICD-10-CM | POA: Diagnosis not present

## 2016-01-19 DIAGNOSIS — R945 Abnormal results of liver function studies: Secondary | ICD-10-CM

## 2016-01-19 MED ORDER — SILDENAFIL CITRATE 20 MG PO TABS: 60.0000 mg | ORAL_TABLET | Freq: Every day | ORAL | 3 refills | Status: DC | PRN

## 2016-01-19 MED ORDER — TRAMADOL HCL 50 MG PO TABS: 50.0000 mg | ORAL_TABLET | Freq: Four times a day (QID) | ORAL | 0 refills | Status: DC | PRN

## 2016-01-19 NOTE — Progress Notes (Signed)
Subjective:    Patient ID: Kyle Campbell, male    DOB: 25-Aug-1960, 55 y.o.   MRN: [id number]  HPI The patient is here today for follow-up of his chronic medical conditions. #1 he has type 2 diabetes mellitus. He is currently on Jardiance. Hemoglobin A1c has fallen dramatically from 8.5-6.5. The patient has lost approximately 4 pounds. With the weight loss and dietary changes in the improvement in his blood sugar, his liver function tests have normalized for the first time in the last 3 years. Overall the patient feels better. He is making a solid effort with his diet. He denies any polyuria, polydipsia, or blurred vision. Diabetic foot exam is performed today. He is due for his flu shot. His blood pressure today is well controlled. He denies any chest pain shortness of breath or dyspnea on exertion. His dyslipidemia has actually improved. His triglycerides have fallen 140 points and although still elevated are much improved. His low HDL cholesterol has risen 7 points from 30-37. His LDL cholesterol remains acceptable at less than 100. He denies any muscle aches. He denies any neuropathy. He denies any vision changes. He does complain of pain in his neck. He has a history of cervical degenerative disc disease. He takes 2 tramadol tablets per day 1 in the morning and 1 at night. With the medication, his pain is relatively well controlled. He would like to avoid surgery if at all possible. He is requesting a refill on his tramadol. There is been no evidence of abuse or diversion. Most recent lab work as listed below: Orders Only on 01/13/2016  Component Date Value Ref Range Status  . Hgb A1c MFr Bld 01/16/2016 6.5* <5.7 % Final   Comment:   For someone without known diabetes, a hemoglobin A1c value of 6.5% or greater indicates that they may have diabetes and this should be confirmed with a follow-up test.   For someone with known diabetes, a value <7% indicates that their diabetes is well controlled  and a value greater than or equal to 7% indicates suboptimal control. A1c targets should be individualized based on duration of diabetes, age, comorbid conditions, and other considerations.   Currently, no consensus exists for use of hemoglobin A1c for diagnosis of diabetes for children.     . Mean Plasma Glucose 01/16/2016 140  mg/dL Final  Lab on 01/13/2016  Component Date Value Ref Range Status  . Sodium 01/13/2016 140  135 - 146 mmol/L Final  . Potassium 01/13/2016 4.5  3.5 - 5.3 mmol/L Final  . Chloride 01/13/2016 103  98 - 110 mmol/L Final  . CO2 01/13/2016 26  20 - 31 mmol/L Final  . Glucose, Bld 01/13/2016 140* 70 - 99 mg/dL Final  . BUN 01/13/2016 13  7 - 25 mg/dL Final  . Creat 01/13/2016 1.01  0.70 - 1.33 mg/dL Final   Comment:   For patients > or = 53 years of age: The upper reference limit for Creatinine is approximately 13% higher for people identified as African-American.     . Total Bilirubin 01/13/2016 0.4  0.2 - 1.2 mg/dL Final  . Alkaline Phosphatase 01/13/2016 67  40 - 115 U/L Final  . AST 01/13/2016 17  10 - 35 U/L Final  . ALT 01/13/2016 28  9 - 46 U/L Final  . Total Protein 01/13/2016 6.4  6.1 - 8.1 g/dL Final  . Albumin 01/13/2016 4.1  3.6 - 5.1 g/dL Final  . Calcium 01/13/2016 8.8  8.6 - 10.3  mg/dL Final  . GFR, Est African American 01/13/2016 >89  >=60 mL/min Final  . GFR, Est Non African American 01/13/2016 85  >=60 mL/min Final  . TSH 01/13/2016 1.36  0.40 - 4.50 mIU/L Final  . Cholesterol 01/13/2016 165  125 - 200 mg/dL Final  . Triglycerides 01/13/2016 236* <150 mg/dL Final  . HDL 01/13/2016 37* >=40 mg/dL Final  . Total CHOL/HDL Ratio 01/13/2016 4.5  <=5.0 Ratio Final  . VLDL 01/13/2016 47* <30 mg/dL Final  . LDL Cholesterol 01/13/2016 81  <130 mg/dL Final   Comment:   Total Cholesterol/HDL Ratio:CHD Risk                        Coronary Heart Disease Risk Table                                        Men       Women          1/2 Average Risk               3.4        3.3              Average Risk              5.0        4.4           2X Average Risk              9.6        7.1           3X Average Risk             23.4       11.0 Use the calculated Patient Ratio above and the CHD Risk table  to determine the patient's CHD Risk.   . WBC 01/13/2016 6.7  3.8 - 10.8 K/uL Final  . RBC 01/13/2016 4.94  4.20 - 5.80 MIL/uL Final  . Hemoglobin 01/13/2016 15.7  13.0 - 17.0 g/dL Final  . HCT 01/13/2016 45.4  38.5 - 50.0 % Final  . MCV 01/13/2016 91.9  80.0 - 100.0 fL Final  . MCH 01/13/2016 31.8  27.0 - 33.0 pg Final  . MCHC 01/13/2016 34.6  32.0 - 36.0 g/dL Final  . RDW 01/13/2016 13.6  11.0 - 15.0 % Final  . Platelets 01/13/2016 181  140 - 400 K/uL Final  . MPV 01/13/2016 10.1  7.5 - 12.5 fL Final  . Neutro Abs 01/13/2016 3819  1,500 - 7,800 cells/uL Final  . Lymphs Abs 01/13/2016 2010  850 - 3,900 cells/uL Final  . Monocytes Absolute 01/13/2016 469  200 - 950 cells/uL Final  . Eosinophils Absolute 01/13/2016 335  15 - 500 cells/uL Final  . Basophils Absolute 01/13/2016 67  0 - 200 cells/uL Final  . Neutrophils Relative % 01/13/2016 57  % Final  . Lymphocytes Relative 01/13/2016 30  % Final  . Monocytes Relative 01/13/2016 7  % Final  . Eosinophils Relative 01/13/2016 5  % Final  . Basophils Relative 01/13/2016 1  % Final  . Smear Review 01/13/2016 Criteria for review not met   Final  . PSA 01/13/2016 0.6  <=4.0 ng/mL Final   Comment:   The total PSA value from this assay system is standardized against the WHO standard. The test result will be approximately  20% lower when compared to the equimolar-standardized total PSA ([employer] Coul[employer]). Comparison of serial PSA results should be interpreted with this fact in mind.   This test was performed using the Siemens chemiluminescent method. Values obtained from different assay methods cannot be used interchangeably. PSA levels, regardless of value, should not be interpreted as  absolute evidence of the presence or absence of disease.   Effective December 15, 2015, Total PSA is being tested on the Siemens Centaur XP using chemiluminescence methodology. Re-baseline testing will be available until March 15, 2016 at no charge. If you have a patient that may require re-baselining, please order [id number] in addition to [id number].   Marland Kitchen Testosterone 01/14/2016 489  250 - 827 ng/dL Final   Past Medical History:  Diagnosis Date  . Asthma   . Diabetes mellitus without complication ([place])   . Erectile dysfunction   . GERD (gastroesophageal reflux disease)   . Hyperlipidemia   . Obstructive sleep apnea    compliant with CPAP  . Testosterone deficiency    Past Surgical History:  Procedure Laterality Date  . TONSILLECTOMY     age 30   Current Outpatient Prescriptions on File Prior to Visit  Medication Sig Dispense Refill  . ADVAIR DISKUS 250-50 MCG/DOSE AEPB USE ONE PUFF TWICE A DAY 180 each 3  . empagliflozin (JARDIANCE) 25 MG TABS tablet Take 25 mg by mouth daily. 30 tablet 3  . metFORMIN (GLUCOPHAGE) 1000 MG tablet TAKE 1 TABLET BY MOUTH TWICE DAILY 180 tablet 2  . PROAIR HFA 108 (90 BASE) MCG/ACT inhaler Inhale one puff by mouth every four hours as needed 8.5 g PRN  . sildenafil (VIAGRA) 100 MG tablet Take 0.5-1 tablets (50-100 mg total) by mouth daily as needed for erectile dysfunction. 10 tablet 11  . SYRINGE-NEEDLE, DISP, 3 ML (BD INTEGRA SYRINGE) 25G X 1" 3 ML MISC USE WITH TESTOSTERONE EVERY 2 WEEKS 100 each 5  . tamsulosin (FLOMAX) 0.4 MG CAPS capsule TAKE 1 CAPSULE BY MOUTH ONCE DAILY 30 capsule 1  . testosterone cypionate (DEPOTESTOSTERONE CYPIONATE) 200 MG/ML injection INJECT 1ML INTRAMUSCULARLY EVERY 14 DAYS 10 mL 3  . UNABLE TO FIND CPAP mask - DX: OSA G47.33 1 Device 0   No current facility-administered medications on file prior to visit.    No Known Allergies Social History   Social History  . Marital status: Married    Spouse name: N/A  . Number of  children: N/A  . Years of education: N/A   Occupational History  . Not on file.   Social History Main Topics  . Smoking status: Never Smoker  . Smokeless tobacco: Never Used  . Alcohol use 0.5 oz/week    1 drink(s) per week  . Drug use: No  . Sexual activity: Yes     Comment: Married.     Other Topics Concern  . Not on file   Social History Narrative   Has 3 children. One 74 year, and 41 year old twins. Works as Administrator. Occasional  ETOH.       Review of Systems  All other systems reviewed and are negative.      Objective:   Physical Exam  Neck: Neck supple. No JVD present. No thyromegaly present.  Cardiovascular: Normal rate, regular rhythm, normal heart sounds and intact distal pulses.  Exam reveals no gallop and no friction rub.   No murmur heard. Pulmonary/Chest: Effort normal and breath sounds normal. No respiratory distress. He has no wheezes. He has no  rales.  Abdominal: Soft. Bowel sounds are normal. He exhibits no distension. There is no tenderness. There is no rebound.  Genitourinary: Rectum normal and prostate normal.  Lymphadenopathy:    He has no cervical adenopathy.          Assessment & Plan:  Dyslipidemia  Hypogonadism in male  Cervical disc disorder with radiculopathy of cervical region  Elevated LFTs  Controlled type 2 diabetes mellitus without complication, without long-term current use of insulin ([place]) I'm extremely happy with the patient's lab work. His hemoglobin A1c is now at goal. His blood pressure is acceptable. His LDL cholesterol is at goal. His triglycerides and his HDL cholesterol have improved. His liver function tests have normalized for the first time in years. I encouraged continued therapeutic lifestyle changes and weight loss. The patient will receive his flu shot today. I did refill his tramadol and gave him 90 tablets which he takes for cervical degenerative disc disease as well as lumbar degenerative disc disease with  radicular symptoms. Testosterone level has normalized. PSA is normal and there is no evidence of possible prostate enlargement do to the testosterone. CBC shows no evidence of polycythemia

## 2016-01-20 NOTE — Addendum Note (Signed)
Addended by: Legrand RamsWILLIS, Madesyn Ast B on: 01/20/2016 10:48 AM   Modules accepted: Orders

## 2016-02-24 ENCOUNTER — Other Ambulatory Visit: Payer: Self-pay | Admitting: Family Medicine

## 2016-02-24 NOTE — Telephone Encounter (Signed)
ok 

## 2016-02-24 NOTE — Telephone Encounter (Signed)
Ok to refill 

## 2016-03-06 ENCOUNTER — Emergency Department: Payer: 59

## 2016-03-06 DIAGNOSIS — E291 Testicular hypofunction: Secondary | ICD-10-CM | POA: Insufficient documentation

## 2016-03-06 DIAGNOSIS — X58XXXA Exposure to other specified factors, initial encounter: Secondary | ICD-10-CM | POA: Diagnosis not present

## 2016-03-06 DIAGNOSIS — Z7984 Long term (current) use of oral hypoglycemic drugs: Secondary | ICD-10-CM | POA: Insufficient documentation

## 2016-03-06 DIAGNOSIS — T18128A Food in esophagus causing other injury, initial encounter: Secondary | ICD-10-CM | POA: Diagnosis not present

## 2016-03-06 DIAGNOSIS — Z794 Long term (current) use of insulin: Secondary | ICD-10-CM | POA: Insufficient documentation

## 2016-03-06 DIAGNOSIS — K219 Gastro-esophageal reflux disease without esophagitis: Secondary | ICD-10-CM | POA: Insufficient documentation

## 2016-03-06 DIAGNOSIS — Y9389 Activity, other specified: Secondary | ICD-10-CM | POA: Diagnosis not present

## 2016-03-06 DIAGNOSIS — E119 Type 2 diabetes mellitus without complications: Secondary | ICD-10-CM | POA: Insufficient documentation

## 2016-03-06 DIAGNOSIS — Y998 Other external cause status: Secondary | ICD-10-CM | POA: Insufficient documentation

## 2016-03-06 DIAGNOSIS — G4733 Obstructive sleep apnea (adult) (pediatric): Secondary | ICD-10-CM | POA: Diagnosis not present

## 2016-03-06 DIAGNOSIS — N529 Male erectile dysfunction, unspecified: Secondary | ICD-10-CM | POA: Diagnosis not present

## 2016-03-06 DIAGNOSIS — T18100A Unspecified foreign body in esophagus causing compression of trachea, initial encounter: Secondary | ICD-10-CM | POA: Diagnosis not present

## 2016-03-06 DIAGNOSIS — J45909 Unspecified asthma, uncomplicated: Secondary | ICD-10-CM | POA: Diagnosis not present

## 2016-03-06 DIAGNOSIS — Y9289 Other specified places as the place of occurrence of the external cause: Secondary | ICD-10-CM | POA: Insufficient documentation

## 2016-03-06 DIAGNOSIS — R1310 Dysphagia, unspecified: Secondary | ICD-10-CM | POA: Diagnosis not present

## 2016-03-06 NOTE — ED Triage Notes (Signed)
Patient reports feels like piece of pork stuck in his throat since 8:15pm.  Patient reports unable to swallow liquids.  Patient spitting out saliva.  Patient is able to maintain airway with out difficulty or respiratory distress noted.  Reports history of same approximate 1 year ago.

## 2016-03-07 ENCOUNTER — Observation Stay: Payer: 59 | Admitting: Anesthesiology

## 2016-03-07 ENCOUNTER — Observation Stay
Admission: EM | Admit: 2016-03-07 | Discharge: 2016-03-07 | Disposition: A | Discharge status: Home / Self Care | Payer: 59 | Attending: Internal Medicine | Admitting: Internal Medicine

## 2016-03-07 ENCOUNTER — Encounter
Admission: EM | Disposition: A | Discharge status: Home / Self Care | Payer: Self-pay | Source: Home / Self Care | Attending: Emergency Medicine

## 2016-03-07 DIAGNOSIS — J45909 Unspecified asthma, uncomplicated: Secondary | ICD-10-CM | POA: Diagnosis not present

## 2016-03-07 DIAGNOSIS — G4733 Obstructive sleep apnea (adult) (pediatric): Secondary | ICD-10-CM | POA: Diagnosis not present

## 2016-03-07 DIAGNOSIS — T18108A Unspecified foreign body in esophagus causing other injury, initial encounter: Secondary | ICD-10-CM

## 2016-03-07 DIAGNOSIS — T18128A Food in esophagus causing other injury, initial encounter: Secondary | ICD-10-CM | POA: Diagnosis not present

## 2016-03-07 DIAGNOSIS — T18100A Unspecified foreign body in esophagus causing compression of trachea, initial encounter: Secondary | ICD-10-CM | POA: Diagnosis not present

## 2016-03-07 DIAGNOSIS — N529 Male erectile dysfunction, unspecified: Secondary | ICD-10-CM | POA: Diagnosis not present

## 2016-03-07 DIAGNOSIS — E119 Type 2 diabetes mellitus without complications: Secondary | ICD-10-CM

## 2016-03-07 DIAGNOSIS — Z794 Long term (current) use of insulin: Secondary | ICD-10-CM | POA: Diagnosis not present

## 2016-03-07 DIAGNOSIS — K219 Gastro-esophageal reflux disease without esophagitis: Secondary | ICD-10-CM | POA: Diagnosis not present

## 2016-03-07 DIAGNOSIS — E291 Testicular hypofunction: Secondary | ICD-10-CM | POA: Diagnosis not present

## 2016-03-07 DIAGNOSIS — Z7984 Long term (current) use of oral hypoglycemic drugs: Secondary | ICD-10-CM | POA: Diagnosis not present

## 2016-03-07 LAB — CBC
HCT: 45.2 % (ref 40.0–52.0)
Hemoglobin: 15.7 g/dL (ref 13.0–18.0)
MCH: 31.8 pg (ref 26.0–34.0)
MCHC: 34.7 g/dL (ref 32.0–36.0)
MCV: 91.7 fL (ref 80.0–100.0)
PLATELETS: 153 10*3/uL (ref 150–440)
RBC: 4.93 MIL/uL (ref 4.40–5.90)
RDW: 13.3 % (ref 11.5–14.5)
WBC: 10.5 10*3/uL (ref 3.8–10.6)

## 2016-03-07 LAB — GLUCOSE, CAPILLARY: Glucose-Capillary: 125 mg/dL — ABNORMAL HIGH (ref 65–99)

## 2016-03-07 LAB — CREATININE, SERUM
CREATININE: 1.01 mg/dL (ref 0.61–1.24)
GFR calc Af Amer: >60 mL/min (ref >60)
GFR calc non Af Amer: >60 mL/min (ref >60)

## 2016-03-07 SURGERY — ESOPHAGOGASTRODUODENOSCOPY (EGD) WITH PROPOFOL
Anesthesia: General

## 2016-03-07 MED ORDER — FENTANYL CITRATE (PF) 100 MCG/2ML IJ SOLN
25.0000 ug | INTRAMUSCULAR | Status: DC | PRN
  Administered 2016-03-07 (×2): 25 ug via INTRAVENOUS

## 2016-03-07 MED ORDER — FENTANYL CITRATE (PF) 100 MCG/2ML IJ SOLN
INTRAMUSCULAR | Status: DC | PRN
  Administered 2016-03-07: 50 ug via INTRAVENOUS

## 2016-03-07 MED ORDER — MIDAZOLAM HCL 2 MG/2ML IJ SOLN
INTRAMUSCULAR | Status: DC | PRN
  Administered 2016-03-07: 1 mg via INTRAVENOUS

## 2016-03-07 MED ORDER — LIDOCAINE VISCOUS 2 % MT SOLN: 15.0000 mL | OROMUCOSAL | 0 refills | Status: DC | PRN

## 2016-03-07 MED ORDER — PREDNISONE 50 MG PO TABS: 50.0000 mg | ORAL_TABLET | Freq: Every day | ORAL | 0 refills | Status: AC

## 2016-03-07 MED ORDER — ACETAMINOPHEN 325 MG PO TABS
650.0000 mg | ORAL_TABLET | Freq: Four times a day (QID) | ORAL | Status: DC | PRN
  Administered 2016-03-07: 650 mg via ORAL
  Filled 2016-03-07: qty 2

## 2016-03-07 MED ORDER — MOMETASONE FURO-FORMOTEROL FUM 200-5 MCG/ACT IN AERO
2.0000 | INHALATION_SPRAY | Freq: Two times a day (BID) | RESPIRATORY_TRACT | Status: DC
  Filled 2016-03-07: qty 8.8

## 2016-03-07 MED ORDER — ONDANSETRON HCL 4 MG/2ML IJ SOLN: 4.0000 mg | Freq: Four times a day (QID) | INTRAMUSCULAR | Status: DC | PRN

## 2016-03-07 MED ORDER — ONDANSETRON HCL 4 MG PO TABS: 4.0000 mg | ORAL_TABLET | Freq: Four times a day (QID) | ORAL | Status: DC | PRN

## 2016-03-07 MED ORDER — ONDANSETRON HCL 4 MG/2ML IJ SOLN: 4.0000 mg | Freq: Once | INTRAMUSCULAR | Status: DC | PRN

## 2016-03-07 MED ORDER — INSULIN ASPART 100 UNIT/ML ~~LOC~~ SOLN: 0.0000 [IU] | Freq: Four times a day (QID) | SUBCUTANEOUS | Status: DC

## 2016-03-07 MED ORDER — ACETAMINOPHEN 650 MG RE SUPP: 650.0000 mg | Freq: Four times a day (QID) | RECTAL | Status: DC | PRN

## 2016-03-07 MED ORDER — GLUCAGON HCL RDNA (DIAGNOSTIC) 1 MG IJ SOLR
INTRAMUSCULAR | Status: AC
  Administered 2016-03-07: 1 mg via INTRAVENOUS
  Filled 2016-03-07: qty 1

## 2016-03-07 MED ORDER — GLUCAGON HCL RDNA (DIAGNOSTIC) 1 MG IJ SOLR
1.0000 mg | Freq: Once | INTRAMUSCULAR | Status: AC
Start: 2016-03-07 — End: 2016-03-07
  Administered 2016-03-07: 1 mg via INTRAVENOUS

## 2016-03-07 MED ORDER — ALBUTEROL SULFATE (2.5 MG/3ML) 0.083% IN NEBU: 3.0000 mL | INHALATION_SOLUTION | RESPIRATORY_TRACT | Status: DC | PRN

## 2016-03-07 MED ORDER — LIDOCAINE HCL (CARDIAC) 20 MG/ML IV SOLN
INTRAVENOUS | Status: DC | PRN
  Administered 2016-03-07: 30 mg via INTRAVENOUS

## 2016-03-07 MED ORDER — LIDOCAINE VISCOUS 2 % MT SOLN
15.0000 mL | OROMUCOSAL | Status: DC | PRN
  Administered 2016-03-07: 15 mL via OROMUCOSAL
  Filled 2016-03-07: qty 15

## 2016-03-07 MED ORDER — SUCCINYLCHOLINE CHLORIDE 20 MG/ML IJ SOLN
INTRAMUSCULAR | Status: DC | PRN
  Administered 2016-03-07: 150 mg via INTRAVENOUS
  Administered 2016-03-07: 20 mg via INTRAVENOUS

## 2016-03-07 MED ORDER — SODIUM CHLORIDE 0.9 % IV SOLN
INTRAVENOUS | Status: DC
  Administered 2016-03-07: 1000 mL via INTRAVENOUS

## 2016-03-07 MED ORDER — ENOXAPARIN SODIUM 40 MG/0.4ML ~~LOC~~ SOLN: 40.0000 mg | SUBCUTANEOUS | Status: DC

## 2016-03-07 MED ORDER — ONDANSETRON HCL 4 MG/2ML IJ SOLN
INTRAMUSCULAR | Status: DC | PRN
  Administered 2016-03-07: 4 mg via INTRAVENOUS

## 2016-03-07 MED ORDER — PROPOFOL 10 MG/ML IV BOLUS
INTRAVENOUS | Status: DC | PRN
  Administered 2016-03-07: 150 mg via INTRAVENOUS
  Administered 2016-03-07: 50 mg via INTRAVENOUS

## 2016-03-07 NOTE — Op Note (Signed)
[facility] Gastroenterology Patient Name: Kyle DecantJames Gouger Procedure Date: 03/07/2016 6:57 AM MRN: [id number] Account #: [id number] Date of Birth: 12-20-1960 Admit Type: Outpatient Age: 55 Room: [facility] ENDO ROOM 4 Gender: Male Note Status: Finalized Procedure:            Upper GI endoscopy Indications:          Foreign body in the esophagus Providers:            Midge Miniumarren Danniel Grenz MD, MD Referring MD:         Priscille HeidelbergWarren T. [name] (Referring MD) Medicines:            General Anesthesia Complications:        No immediate complications. Procedure:            Pre-Anesthesia Assessment:                       - Prior to the procedure, a History and Physical was                        performed, and patient medications and allergies were                        reviewed. The patient's tolerance of previous                        anesthesia was also reviewed. The risks and benefits of                        the procedure and the sedation options and risks were                        discussed with the patient. All questions were                        answered, and informed consent was obtained. Prior                        Anticoagulants: The patient has taken no previous                        anticoagulant or antiplatelet agents. ASA Grade                        Assessment: II - A patient with mild systemic disease.                        After reviewing the risks and benefits, the patient was                        deemed in satisfactory condition to undergo the                        procedure.                       After obtaining informed consent, the endoscope was                        passed under direct vision. Throughout the procedure,  the patient's blood pressure, pulse, and oxygen                        saturations were monitored continuously. The                        Colonoscope was introduced through the mouth, and   advanced to the second part of duodenum. The upper GI                        endoscopy was accomplished without difficulty. The                        patient tolerated the procedure well. Findings:      Food was found at the gastroesophageal junction. Removal of food was       accomplished.      The stomach was normal.      The examined duodenum was normal. Impression:           - Food at the gastroesophageal junction. Removal was                        successful.                       - Normal stomach.                       - Normal examined duodenum. Recommendation:       - Discharge patient to home.                       - Mechanical soft diet for 3 weeks.                       - Continue present medications. Procedure Code(s):    --- Professional ---                       [phone] [id number], Esophagogastroduodenoscopy, flexible, transoral;                        with removal of foreign body(s) Diagnosis Code(s):    --- Professional ---                       T18.108A, Unspecified foreign body in esophagus causing                        other injury, initial encounter CPT copyright 2016 American Medical Association. All rights reserved. The codes documented in this report are preliminary and upon coder review may  be revised to meet current compliance requirements. Midge Miniumarren Raiana Pharris MD, MD 03/07/2016 7:29:24 AM This report has been signed electronically. Number of Addenda: 0 Note Initiated On: 03/07/2016 6:57 AM      [facility]

## 2016-03-07 NOTE — Progress Notes (Signed)
Patient given d/c instructions r/t activity, diet, followup care and medications, voiced understanding, pt given prescription x1, pt d/c home with wife.

## 2016-03-07 NOTE — Discharge Summary (Signed)
[facility] [place] [facility]   PATIENT NAME: Kyle Campbell    MR#:  [id number]  DATE OF BIRTH:  [birth year]  DATE OF ADMISSION:  03/07/2016 ADMITTING PHYSICIAN: Oralia Manisavid Willis, MD  DATE OF DISCHARGE: 03/07/2016  PRIMARY CARE PHYSICIAN: Lynnea FerrierPICKARD,WARREN TOM, MD    ADMISSION DIAGNOSIS:  In throat foreign body  DISCHARGE DIAGNOSIS:  Principal Problem:   Esophageal foreign body Active Problems:   Diabetes mellitus without complication (HCC)   GERD (gastroesophageal reflux disease)   SECONDARY DIAGNOSIS:   Past Medical History:  Diagnosis Date  . Asthma   . Diabetes mellitus without complication (HCC)   . Erectile dysfunction   . GERD (gastroesophageal reflux disease)   . Hyperlipidemia   . Obstructive sleep apnea    compliant with CPAP  . Testosterone deficiency     HOSPITAL COURSE:    55 year old male with a history of diabetes who presents with foreign body lodged in his esophagus. 1. Esophageal foreign body: Patient underwent EGD which showed food at the GE junction and removal food was accomplished. Patient will need to be on a mechanical soft diet with repeat EGD in 3 weeks. Intubation was difficult and therefore patient did have a sore throat. He will be discharged with steroids for inflammation as well as lidocaine swish and swallow. 2. Diabetes: Patient may resume metformin  3. OSA: Patient is compliant with CPAP    DISCHARGE CONDITIONS AND DIET:   Stable for discharge on mechanical soft diet for 3 weeks  CONSULTS OBTAINED:    DRUG ALLERGIES:  No Known Allergies  DISCHARGE MEDICATIONS:   Current Discharge Medication List    START taking these medications   Details  lidocaine (XYLOCAINE) 2 % solution Use as directed 15 mLs in the mouth or throat every 3 (three) hours as needed for mouth pain. Qty: 100 mL, Refills: 0    predniSONE (DELTASONE) 50 MG tablet Take 1 tablet (50 mg total) by mouth daily with breakfast. Qty: 3  tablet, Refills: 0      CONTINUE these medications which have NOT CHANGED   Details  ADVAIR DISKUS 250-50 MCG/DOSE AEPB USE ONE PUFF TWICE A DAY Qty: 180 each, Refills: 3    empagliflozin (JARDIANCE) 25 MG TABS tablet Take 25 mg by mouth daily. Qty: 30 tablet, Refills: 3    metFORMIN (GLUCOPHAGE) 1000 MG tablet TAKE 1 TABLET BY MOUTH TWICE DAILY Qty: 180 tablet, Refills: 2    PROAIR HFA 108 (90 BASE) MCG/ACT inhaler Inhale one puff by mouth every four hours as needed Qty: 8.5 g, Refills: PRN    UNABLE TO FIND CPAP mask - DX: OSA G47.33 Qty: 1 Device, Refills: 0              Today   CHIEF COMPLAINT:   Patient is doing well this morning. He is ready for discharge. He does have a sore throat however the lidocaine did help.  VITAL SIGNS:  Blood pressure 120/66, pulse 75, temperature 98.2 F (36.8 C), temperature source Oral, resp. rate 16, height 6\' 1"  (1.854 m), weight 101.6 kg (224 lb), SpO2 98 %.   REVIEW OF SYSTEMS:  Review of Systems  Constitutional: Negative.  Negative for chills, fever and malaise/fatigue.  HENT: Positive for sore throat. Negative for ear discharge, ear pain, hearing loss and nosebleeds.   Eyes: Negative.  Negative for blurred vision and pain.  Respiratory: Negative.  Negative for cough, hemoptysis, shortness of breath and wheezing.   Cardiovascular: Negative.  Negative for  chest pain, palpitations and leg swelling.  Gastrointestinal: Negative.  Negative for abdominal pain, blood in stool, diarrhea, nausea and vomiting.  Genitourinary: Negative.  Negative for dysuria.  Musculoskeletal: Negative.  Negative for back pain.  Skin: Negative.   Neurological: Negative for dizziness, tremors, speech change, focal weakness, seizures and headaches.  Endo/Heme/Allergies: Negative.  Does not bruise/bleed easily.  Psychiatric/Behavioral: Negative.  Negative for depression, hallucinations and suicidal ideas.     PHYSICAL EXAMINATION:  GENERAL:  55  y.o.-year-old patient lying in the bed with no acute distress.  NECK:  Supple, no jugular venous distention. No thyroid enlargement, no tenderness.  LUNGS: Normal breath sounds bilaterally, no wheezing, rales,rhonchi  No use of accessory muscles of respiration.  CARDIOVASCULAR: S1, S2 normal. No murmurs, rubs, or gallops.  ABDOMEN: Soft, non-tender, non-distended. Bowel sounds present. No organomegaly or mass.  EXTREMITIES: No pedal edema, cyanosis, or clubbing.  PSYCHIATRIC: The patient is alert and oriented x 3.  SKIN: No obvious rash, lesion, or ulcer.   DATA REVIEW:   CBC  Recent Labs Lab 03/07/16 0536  WBC 10.5  HGB 15.7  HCT 45.2  PLT 153    Chemistries   Recent Labs Lab 03/07/16 0536  CREATININE 1.01    Cardiac Enzymes No results for input(s): TROPONINI in the last 168 hours.  Microbiology Results  @MICRORSLT48 @  RADIOLOGY:  Dg Neck Soft Tissue  Result Date: 03/06/2016 CLINICAL DATA:  55 year old male complaining of fluid stuck in throat. EXAM: NECK SOFT TISSUES - 1+ VIEW COMPARISON:  None. FINDINGS: There is no evidence of retropharyngeal soft tissue swelling or epiglottic enlargement. The cervical airway is unremarkable and no radio-opaque foreign body identified. IMPRESSION: Negative. Electronically Signed   By: Elgie CollardArash  Radparvar M.D.   On: 03/06/2016 22:49      Management plans discussed with the patient and he is in agreement. Stable for discharge home  Patient should follow up with pcp and Gi   CODE STATUS:     Code Status Orders        Start     Ordered   03/07/16 0508  Full code  Continuous     03/07/16 0507    Code Status History    Date Active Date Inactive Code Status Order ID Comments User Context   This patient has a current code status but no historical code status.      TOTAL TIME TAKING CARE OF THIS PATIENT: 38 minutes.    Note: This dictation was prepared with Dragon dictation along with smaller phrase technology. Any  transcriptional errors that result from this process are unintentional.  Mackinzie Vuncannon M.D on 03/07/2016 at 10:17 AM  Between 7am to 6pm - Pager - [phone] After 6pm go to [email or web address] - password [email or web address]  [facility]  Office  [phone]  CC: Primary care physician; Leo GrosserPICKARD,WARREN TOM, MD

## 2016-03-07 NOTE — OR Nursing (Signed)
Patient eating ice pop and tolerated that well

## 2016-03-07 NOTE — ED Notes (Signed)
Pt was eating tonight, got a piece of pork "stuck" in his throat. Denies difficulty breathing.

## 2016-03-07 NOTE — Anesthesia Procedure Notes (Signed)
Procedure Name: Intubation Date/Time: 03/07/2016 7:20 AM Performed by: Ginger CarneMICHELET, Chavela Justiniano Pre-anesthesia Checklist: Patient identified, Emergency Drugs available, Suction available, Patient being monitored and Timeout performed Patient Re-evaluated:Patient Re-evaluated prior to inductionOxygen Delivery Method: Circle system utilized Preoxygenation: Pre-oxygenation with 100% oxygen Intubation Type: IV induction, Rapid sequence and Cricoid Pressure applied Laryngoscope Size: Mac, 4, McGraph and 3 Grade View: Grade III Tube type: Oral Tube size: 7.5 mm Number of attempts: 3 Airway Equipment and Method: Stylet,  Fiberoptic brochoscope and Video-laryngoscopy Placement Confirmation: ETT inserted through vocal cords under direct vision,  positive ETCO2 and breath sounds checked- equal and bilateral Secured at: 21 cm Tube secured with: Tape Dental Injury: Teeth and Oropharynx as per pre-operative assessment  Difficulty Due To: Difficulty was anticipated and Difficult Airway- due to anterior larynx Comments: DVL by Dr Clayborn Bignesshomas X1 unablbe to visualize. Mask ventilated to return sats to 98%. DVL by Dr Maisie Fushomas with Lorri FrederickMcgrath 3  Unable to pass ETT.  Mask ventilate    Pediatric scope inserted with ETT over scope  VCV  ETT placed  Positive ETCO2  Positive breath sounds

## 2016-03-07 NOTE — Transfer of Care (Signed)
Immediate Anesthesia Transfer of Care Note  Patient: Kyle ForesterJames E Meyering  Procedure(s) Performed: Procedure(s): ESOPHAGOGASTRODUODENOSCOPY (EGD) WITH PROPOFOL (N/A)  Patient Location: PACU  Anesthesia Type:General  Level of Consciousness: awake, alert  and oriented  Airway & Oxygen Therapy: Patient Spontanous Breathing and Patient connected to nasal cannula oxygen  Post-op Assessment: Report given to RN and Post -op Vital signs reviewed and stable  Post vital signs: Reviewed and stable  Last Vitals:  Vitals:   03/07/16 0510 03/07/16 0748  BP: 126/81 123/84  Pulse: 78 96  Resp: 19 16  Temp: 36.9 C 36.3 C    Last Pain:  Vitals:   03/07/16 0510  TempSrc: Oral         Complications: No apparent anesthesia complications

## 2016-03-07 NOTE — Anesthesia Preprocedure Evaluation (Signed)
Anesthesia Evaluation  Patient identified by MRN, date of birth, ID band Patient awake    Reviewed: Allergy & Precautions, NPO status , Patient's Chart, lab work & pertinent test results, reviewed documented beta blocker date and time   Airway Mallampati: III  TM Distance: >3 FB     Dental  (+) Chipped   Pulmonary shortness of breath, asthma , sleep apnea ,           Cardiovascular      Neuro/Psych    GI/Hepatic GERD  ,  Endo/Other  diabetes, Type 2  Renal/GU      Musculoskeletal   Abdominal   Peds  Hematology   Anesthesia Other Findings   Reproductive/Obstetrics                             Anesthesia Physical Anesthesia Plan  ASA: III  Anesthesia Plan: General   Post-op Pain Management:    Induction: Intravenous  Airway Management Planned: Oral ETT  Additional Equipment:   Intra-op Plan:   Post-operative Plan:   Informed Consent: I have reviewed the patients History and Physical, chart, labs and discussed the procedure including the risks, benefits and alternatives for the proposed anesthesia with the patient or authorized representative who has indicated his/her understanding and acceptance.     Plan Discussed with: CRNA  Anesthesia Plan Comments:         Anesthesia Quick Evaluation

## 2016-03-07 NOTE — Anesthesia Postprocedure Evaluation (Signed)
Anesthesia Post Note  Patient: Kyle Campbell  Procedure(Campbell) Performed: Procedure(Campbell) (LRB): ESOPHAGOGASTRODUODENOSCOPY (EGD) WITH PROPOFOL (N/A)  Patient location during evaluation: Endoscopy Anesthesia Type: General Level of consciousness: awake and alert Pain management: pain level controlled Vital Signs Assessment: post-procedure vital signs reviewed and stable Respiratory status: spontaneous breathing, nonlabored ventilation, respiratory function stable and patient connected to nasal cannula oxygen Cardiovascular status: blood pressure returned to baseline and stable Postop Assessment: no signs of nausea or vomiting Anesthetic complications: no    Last Vitals:  Vitals:   03/07/16 0748 03/07/16 0901  BP: 123/84 120/66  Pulse: 96 75  Resp: 16   Temp: 36.3 C 36.8 C    Last Pain:  Vitals:   03/07/16 1030  TempSrc:   PainSc: 2                  Kyle Campbell

## 2016-03-07 NOTE — H&P (Signed)
[facility] [place] [facility]   PATIENT NAME: Kyle Campbell    MR#:  [id number]  DATE OF BIRTH:  10/27/1960  DATE OF ADMISSION:  03/07/2016  PRIMARY CARE PHYSICIAN: Leo GrosserPICKARD,WARREN TOM, MD   REQUESTING/REFERRING PHYSICIAN: Manson PasseyBrown, MD  CHIEF COMPLAINT:   Chief Complaint  Patient presents with  . Foreign Body    HISTORY OF PRESENT ILLNESS:  Kyle Campbell  is a 55 y.o. male who presents with Foreign body lodged in his esophagus. Patient states that he was eating dinner, he was eating some pork, and that when he swallowed a piece it got eyes in his esophagus and he has not been able to pass it. He states that whenever he tries to drink something he just vomits it back up. ED physician contacted gastroenterologist stated that they could see the patient today for endoscopy to remove the food from his esophagus, hospitalists were called for admission.  PAST MEDICAL HISTORY:   Past Medical History:  Diagnosis Date  . Asthma   . Diabetes mellitus without complication (HCC)   . Erectile dysfunction   . GERD (gastroesophageal reflux disease)   . Hyperlipidemia   . Obstructive sleep apnea    compliant with CPAP  . Testosterone deficiency     PAST SURGICAL HISTORY:   Past Surgical History:  Procedure Laterality Date  . TONSILLECTOMY     age 89    SOCIAL HISTORY:   Social History  Substance Use Topics  . Smoking status: Never Smoker  . Smokeless tobacco: Never Used  . Alcohol use 0.5 oz/week    1 drink(s) per week    FAMILY HISTORY:   Family History  Problem Relation Age of Onset  . Lung cancer Father     DRUG ALLERGIES:  No Known Allergies  MEDICATIONS AT HOME:   Prior to Admission medications   Medication Sig Start Date End Date Taking? Authorizing Provider  ADVAIR DISKUS 250-50 MCG/DOSE AEPB USE ONE PUFF TWICE A DAY 11/12/15   Donita BrooksWarren T [name], MD  empagliflozin (JARDIANCE) 25 MG TABS tablet Take 25 mg by mouth daily. 10/24/15    Donita BrooksWarren T [name], MD  metFORMIN (GLUCOPHAGE) 1000 MG tablet TAKE 1 TABLET BY MOUTH TWICE DAILY 09/11/15   Donita BrooksWarren T [name], MD  [facility]FA 108 (90 BASE) MCG/ACT inhaler Inhale one puff by mouth every four hours as needed 07/13/13   Donita BrooksWarren T [name], MD  sildenafil (REVATIO) 20 MG tablet Take 3 tablets (60 mg total) by mouth daily as needed. 01/19/16   Donita BrooksWarren T [name], MD  sildenafil (VIAGRA) 100 MG tablet Take 0.5-1 tablets (50-100 mg total) by mouth daily as needed for erectile dysfunction. 11/20/14   Donita BrooksWarren T [name], MD  SYRINGE-NEEDLE, DISP, 3 ML (BD INTEGRA SYRINGE) 25G X 1" 3 ML MISC USE WITH TESTOSTERONE EVERY 2 WEEKS 09/10/15   Donita BrooksWarren T [name], MD  tamsulosin (FLOMAX) 0.4 MG CAPS capsule TAKE 1 CAPSULE BY MOUTH ONCE DAILY 05/16/15   Donita BrooksWarren T [name], MD  testosterone cypionate (DEPOTESTOSTERONE CYPIONATE) 200 MG/ML injection INJECT 1ML INTRAMUSCULARLY EVERY 14 DAYS 09/10/15   Donita BrooksWarren T [name], MD  traMADol (ULTRAM) 50 MG tablet TAKE 1 TABLET BY MOUTH EVERY 6 HOURS AS NEEDED 02/25/16   Donita BrooksWarren T [name], MD  UNABLE TO FIND CPAP mask - DX: OSA G47.33 06/16/15   Donita BrooksWarren T [name], MD    REVIEW OF SYSTEMS:  Review of Systems  Constitutional: Negative for chills, fever, malaise/fatigue and weight loss.  HENT: Negative for ear  pain, hearing loss and tinnitus.        Foreign body (food) lodged in esophagus  Eyes: Negative for blurred vision, double vision, pain and redness.  Respiratory: Negative for cough, hemoptysis and shortness of breath.   Cardiovascular: Negative for chest pain, palpitations, orthopnea and leg swelling.  Gastrointestinal: Negative for abdominal pain, constipation, diarrhea, nausea and vomiting.  Genitourinary: Negative for dysuria, frequency and hematuria.  Musculoskeletal: Negative for back pain, joint pain and neck pain.  Skin:       No acne, rash, or lesions  Neurological: Negative for dizziness, tremors, focal weakness and weakness.  Endo/Heme/Allergies: Negative for  polydipsia. Does not bruise/bleed easily.  Psychiatric/Behavioral: Negative for depression. The patient is not nervous/anxious and does not have insomnia.      VITAL SIGNS:   Vitals:   03/06/16 2201 03/07/16 0200  BP: (!) 143/84 128/87  Pulse: 86 87  Resp: 20   Temp: 98.3 F (36.8 C)   TempSrc: Oral   SpO2: 95% 97%  Weight: 108.9 kg (240 lb)   Height: 6\' 1"  (1.854 m)    Wt Readings from Last 3 Encounters:  03/06/16 108.9 kg (240 lb)  01/19/16 108 kg (238 lb)  09/05/15 109.8 kg (242 lb)    PHYSICAL EXAMINATION:  Physical Exam  Vitals reviewed. Constitutional: He is oriented to person, place, and time. He appears well-developed and well-nourished. No distress.  HENT:  Head: Normocephalic and atraumatic.  Mouth/Throat: Oropharynx is clear and moist.  Eyes: Conjunctivae and EOM are normal. Pupils are equal, round, and reactive to light. No scleral icterus.  Neck: Normal range of motion. Neck supple. No JVD present. No tracheal deviation present. No thyromegaly present.  Cardiovascular: Normal rate, regular rhythm and intact distal pulses.  Exam reveals no gallop and no friction rub.   No murmur heard. Respiratory: Effort normal and breath sounds normal. No stridor. No respiratory distress. He has no wheezes. He has no rales.  GI: Soft. Bowel sounds are normal. He exhibits no distension. There is no tenderness.  Musculoskeletal: Normal range of motion. He exhibits no edema.  No arthritis, no gout  Lymphadenopathy:    He has no cervical adenopathy.  Neurological: He is alert and oriented to person, place, and time. No cranial nerve deficit.  No dysarthria, no aphasia  Skin: Skin is warm and dry. No rash noted. No erythema.  Psychiatric: He has a normal mood and affect. His behavior is normal. Judgment and thought content normal.    LABORATORY PANEL:   CBC No results for input(s): WBC, HGB, HCT, PLT in the last 168  hours. ------------------------------------------------------------------------------------------------------------------  Chemistries  No results for input(s): NA, K, CL, CO2, GLUCOSE, BUN, CREATININE, CALCIUM, MG, AST, ALT, ALKPHOS, BILITOT in the last 168 hours.  Invalid input(s): GFRCGP ------------------------------------------------------------------------------------------------------------------  Cardiac Enzymes No results for input(s): TROPONINI in the last 168 hours. ------------------------------------------------------------------------------------------------------------------  RADIOLOGY:  Dg Neck Soft Tissue  Result Date: 03/06/2016 CLINICAL DATA:  55 year old male complaining of fluid stuck in throat. EXAM: NECK SOFT TISSUES - 1+ VIEW COMPARISON:  None. FINDINGS: There is no evidence of retropharyngeal soft tissue swelling or epiglottic enlargement. The cervical airway is unremarkable and no radio-opaque foreign body identified. IMPRESSION: Negative. Electronically Signed   By: Elgie CollardArash  Radparvar M.D.   On: 03/06/2016 22:49    EKG:   Orders placed or performed in visit on 12/18/08  . Converted [facility] EKG    IMPRESSION AND PLAN:  Principal Problem:   Esophageal foreign body - gastroenterology plans  to scope him today to remove the foreign body. His area secure and patent. Active Problems:   Diabetes mellitus without complication (HCC) - sliding scale insulin with corresponding glucose checks   GERD (gastroesophageal reflux disease) - home dose PPI once he is able to swallow  All the records are reviewed and case discussed with ED provider. Management plans discussed with the patient and/or family.  DVT PROPHYLAXIS: SubQ lovenox  GI PROPHYLAXIS: None  ADMISSION STATUS: Observation  CODE STATUS: Full Code Status History    This patient does not have a recorded code status. Please follow your organizational policy for patients in this situation.      TOTAL TIME  TAKING CARE OF THIS PATIENT: 40 minutes.    Gustin Zobrist FIELDING 03/07/2016, 4:18 AM  [facility]  Office  [phone]  CC: Primary care physician; Leo Grosser, MD

## 2016-03-15 ENCOUNTER — Telehealth: Payer: Self-pay | Admitting: *Deleted

## 2016-03-15 NOTE — Telephone Encounter (Signed)
Patient's wife called and states that Dr. Servando SnareWohl wanted to do another procedure on the patient.   She is requesting a call back to schedule that procedure. Her call back number is [phone]. She is also requesting to call prior to because she is a Charity fundraiserN in the OR and needs to look at her schedule . Please advise. Thank you.

## 2016-03-16 ENCOUNTER — Other Ambulatory Visit: Payer: Self-pay

## 2016-03-16 DIAGNOSIS — R112 Nausea with vomiting, unspecified: Secondary | ICD-10-CM

## 2016-03-16 NOTE — Telephone Encounter (Signed)
Left vm for pt's wife to return my call to schedule gastric emptying study.

## 2016-03-16 NOTE — Telephone Encounter (Signed)
Gastric Emptying study scheduled for 03/25/16 at 10am. Arrive at 0930. NPO after MN. This is a 4 hour test.  Call made to patient's wife at this time. No answer. Left voicemail stating all above information per her request.

## 2016-03-16 NOTE — Telephone Encounter (Signed)
Wife returned phone call at this time. She would like to set-up Gastric emptying study on on 11/10, 15, 16, 20, 21.  Order placed.  Call made to Central scheduling at this time. Voicemail left for return phone call.

## 2016-03-17 NOTE — Telephone Encounter (Signed)
Called Arline AspCindy (wife) again to verify she received Vm regarding husband's gastric emptying study. Requested a call back to let me know.

## 2016-03-19 NOTE — ED Provider Notes (Signed)
[facility] Emergency Department Provider Note   I have reviewed the triage vital signs and the nursing notes.   HISTORY  Chief Complaint Foreign Body  HPI Kyle ForesterJames E Mccarney is a 55 y.o. male presents with history of "a piece of pork stuck in his throat since 8:15 PM". Patient states that he's had one previous episode of esophageal foreign body that had to be extracted and was nonresponsive to glucagon. Patient states that he is unable to swallow liquids actively spitting his saliva a minor presentation to room. Patient denies any difficulty breathing.  Past Medical History:  Diagnosis Date  . Asthma   . Diabetes mellitus without complication (HCC)   . Erectile dysfunction   . GERD (gastroesophageal reflux disease)   . Hyperlipidemia   . Obstructive sleep apnea    compliant with CPAP  . Testosterone deficiency     Patient Active Problem List   Diagnosis Date Noted  . GERD (gastroesophageal reflux disease) 03/07/2016  . Esophageal foreign body 03/07/2016  . OAB (overactive bladder) 01/13/2016  . Internal hemorrhoid 10/08/2014  . Diabetes mellitus without complication (HCC)   . Testosterone deficiency   . HYPERLIPIDEMIA-MIXED 12/18/2008  . SHORTNESS OF BREATH 12/18/2008    Past Surgical History:  Procedure Laterality Date  . TONSILLECTOMY     age 89    Prior to Admission medications   Medication Sig Start Date End Date Taking? Authorizing Provider  ADVAIR DISKUS 250-50 MCG/DOSE AEPB USE ONE PUFF TWICE A DAY 11/12/15  Yes Donita BrooksWarren T [name], MD  empagliflozin (JARDIANCE) 25 MG TABS tablet Take 25 mg by mouth daily. 10/24/15  Yes Donita BrooksWarren T [name], MD  metFORMIN (GLUCOPHAGE) 1000 MG tablet TAKE 1 TABLET BY MOUTH TWICE DAILY 09/11/15  Yes Donita BrooksWarren T [name], MD  [facility]FA [phone](90 BASE) MCG/ACT inhaler Inhale one puff by mouth every four hours as needed 07/13/13  Yes Donita BrooksWarren T [name], MD  UNABLE TO FIND CPAP mask - DX: OSA G47.33 06/16/15  Yes Donita BrooksWarren T [name],  MD  lidocaine (XYLOCAINE) 2 % solution Use as directed 15 mLs in the mouth or throat every 3 (three) hours as needed for mouth pain. 03/07/16   Adrian SaranSital Mody, MD    Allergies Patient has no known allergies.  Family History  Problem Relation Age of Onset  . Lung cancer Father     Social History Social History  Substance Use Topics  . Smoking status: Never Smoker  . Smokeless tobacco: Never Used  . Alcohol use 3.6 oz/week    1 Standard drinks or equivalent, 5 Shots of liquor per week     Comment: weekend only    Review of Systems Constitutional: No fever/chills Eyes: No visual changes. ENT: No sore throat.Positive for foreign body in esophagus Cardiovascular: Denies chest pain. Respiratory: Denies shortness of breath. Gastrointestinal: No abdominal pain.  No nausea, no vomiting.  No diarrhea.  No constipation. Genitourinary: Negative for dysuria. Musculoskeletal: Negative for back pain. Skin: Negative for rash. Neurological: Negative for headaches, focal weakness or numbness.  10-point ROS otherwise negative.  ____________________________________________   PHYSICAL EXAM:  VITAL SIGNS: ED Triage Vitals  Enc Vitals Group     BP 03/06/16 2201 (!) 143/84     Pulse Rate 03/06/16 2201 86     Resp 03/06/16 2201 20     Temp 03/06/16 2201 98.3 F (36.8 C)     Temp Source 03/06/16 2201 Oral     SpO2 03/06/16 2201 95 %  Weight 03/06/16 2201 240 lb (108.9 kg)     Height 03/06/16 2201 6\' 1"  (1.854 m)     Head Circumference --      Peak Flow --      Pain Score 03/07/16 0800 5     Pain Loc --      Pain Edu? --      Excl. in GC? --     Constitutional: Alert and oriented. Apparent discomfort. Eyes: Conjunctivae are normal. PERRL. EOMI. Head: Atraumatic. Mouth/Throat: Mucous membranes are moist.  Oropharynx non-erythematous. Neck: No stridor.  No meningeal signs.  No cervical spine tenderness to palpation. Cardiovascular: Normal rate, regular rhythm. Good peripheral  circulation. Grossly normal heart sounds. Respiratory: Normal respiratory effort.  No retractions. Lungs CTAB. Gastrointestinal: Soft and nontender. No distention.  Musculoskeletal: No lower extremity tenderness nor edema. No gross deformities of extremities. Neurologic:  Normal speech and language. No gross focal neurologic deficits are appreciated.  Skin:  Skin is warm, dry and intact. No rash noted. Psychiatric: Mood and affect are normal. Speech and behavior are normal.  ____________________________________________   LABS (all labs ordered are listed, but only abnormal results are displayed)  Labs Reviewed  GLUCOSE, CAPILLARY - Abnormal; Notable for the following:       Result Value   Glucose-Capillary 125 (*)    All other components within normal limits  CBC  CREATININE, SERUM   _____________:   Procedures    INITIAL IMPRESSION / ASSESSMENT AND PLAN / ED COURSE  Pertinent labs & imaging results that were available during my care of the patient were reviewed by me and considered in my medical decision making (see chart for details).  Patient given glucagon 1 mg IV as well as attempt to drink carbonated beverage without any resolution of symptoms. As such Dr. Servando SnareWohl gastroenterologist notified   Clinical Course     ____________________________________________  FINAL CLINICAL IMPRESSION(S) / ED DIAGNOSES  Final diagnoses:  Esophageal foreign body     MEDICATIONS GIVEN DURING THIS VISIT:  Medications  glucagon (human recombinant) (GLUCAGEN) injection 1 mg (1 mg Intravenous Given 03/07/16 0234)     NEW OUTPATIENT MEDICATIONS STARTED DURING THIS VISIT:  Discharge Medication List as of 03/07/2016 10:12 AM    START taking these medications   Details  lidocaine (XYLOCAINE) 2 % solution Use as directed 15 mLs in the mouth or throat every 3 (three) hours as needed for mouth pain., Starting Sun 03/07/2016, Normal    predniSONE (DELTASONE) 50 MG tablet Take 1  tablet (50 mg total) by mouth daily with breakfast., Starting Sun 03/07/2016, Until Wed 03/10/2016, Print        Discharge Medication List as of 03/07/2016 10:12 AM      Discharge Medication List as of 03/07/2016 10:12 AM       Note:  This document was prepared using Dragon voice recognition software and may include unintentional dictation errors.    Darci Currentandolph N Rheya Minogue, MD 03/19/16 [phone]

## 2016-03-23 ENCOUNTER — Other Ambulatory Visit: Payer: Self-pay | Admitting: Family Medicine

## 2016-03-25 ENCOUNTER — Encounter
Admission: RE | Admit: 2016-03-25 | Discharge: 2016-03-25 | Disposition: A | Discharge status: Home / Self Care | Payer: 59 | Source: Ambulatory Visit | Attending: Gastroenterology | Admitting: Gastroenterology

## 2016-03-25 DIAGNOSIS — R112 Nausea with vomiting, unspecified: Secondary | ICD-10-CM | POA: Diagnosis not present

## 2016-03-25 MED ORDER — TECHNETIUM TC 99M SULFUR COLLOID
2.7600 | Freq: Once | INTRAVENOUS | Status: AC | PRN
  Administered 2016-03-25: 2.76 via INTRAVENOUS

## 2016-03-29 ENCOUNTER — Telehealth: Payer: Self-pay

## 2016-03-29 NOTE — Telephone Encounter (Signed)
Pt notified of gastric emptying study.  

## 2016-03-29 NOTE — Telephone Encounter (Signed)
-----   Message from Midge Miniumarren Wohl, MD sent at 03/28/2016  4:21 PM EST ----- The patient know that the gastric emptying study was normal

## 2016-06-28 ENCOUNTER — Other Ambulatory Visit: Payer: Self-pay | Admitting: Family Medicine

## 2016-06-28 NOTE — Telephone Encounter (Signed)
Ok to refill 

## 2016-06-29 NOTE — Telephone Encounter (Signed)
ok 

## 2016-07-19 ENCOUNTER — Ambulatory Visit: Payer: 59 | Admitting: Family Medicine

## 2016-08-11 ENCOUNTER — Other Ambulatory Visit: Payer: Self-pay | Admitting: Family Medicine

## 2016-08-11 NOTE — Telephone Encounter (Signed)
Medication refill for one time only.  Patient needs to be seen.  Letter sent for patient to call and schedule 

## 2016-09-20 ENCOUNTER — Other Ambulatory Visit: Payer: 59

## 2016-09-20 DIAGNOSIS — R7989 Other specified abnormal findings of blood chemistry: Secondary | ICD-10-CM | POA: Diagnosis not present

## 2016-09-20 DIAGNOSIS — E119 Type 2 diabetes mellitus without complications: Secondary | ICD-10-CM

## 2016-09-20 DIAGNOSIS — Z79899 Other long term (current) drug therapy: Secondary | ICD-10-CM

## 2016-09-20 DIAGNOSIS — E785 Hyperlipidemia, unspecified: Secondary | ICD-10-CM

## 2016-09-20 DIAGNOSIS — R945 Abnormal results of liver function studies: Secondary | ICD-10-CM

## 2016-09-20 LAB — CBC WITH DIFFERENTIAL/PLATELET
BASOS PCT: 1 %
Basophils Absolute: 76 cells/uL (ref 0–200)
EOS ABS: 380 {cells}/uL (ref 15–500)
Eosinophils Relative: 5 %
HCT: 46.6 % (ref 38.5–50.0)
Hemoglobin: 15.5 g/dL (ref 13.0–17.0)
Lymphocytes Relative: 29 %
Lymphs Abs: 2204 cells/uL (ref 850–3900)
MCH: 31.9 pg (ref 27.0–33.0)
MCHC: 33.3 g/dL (ref 32.0–36.0)
MCV: 95.9 fL (ref 80.0–100.0)
MONOS PCT: 5 %
MPV: 9.7 fL (ref 7.5–12.5)
Monocytes Absolute: 380 cells/uL (ref 200–950)
NEUTROS ABS: 4560 {cells}/uL (ref 1500–7800)
Neutrophils Relative %: 60 %
PLATELETS: 177 10*3/uL (ref 140–400)
RBC: 4.86 MIL/uL (ref 4.20–5.80)
RDW: 13.7 % (ref 11.0–15.0)
WBC: 7.6 10*3/uL (ref 3.8–10.8)

## 2016-09-21 LAB — LIPID PANEL
CHOLESTEROL: 169 mg/dL (ref <200)
HDL: 34 mg/dL — ABNORMAL LOW (ref >40)
LDL Cholesterol: 96 mg/dL (ref <100)
TRIGLYCERIDES: 197 mg/dL — AB (ref <150)
Total CHOL/HDL Ratio: 5 Ratio — ABNORMAL HIGH (ref <5.0)
VLDL: 39 mg/dL — ABNORMAL HIGH (ref <30)

## 2016-09-21 LAB — COMPREHENSIVE METABOLIC PANEL
ALK PHOS: 72 U/L (ref 40–115)
ALT: 40 U/L (ref 9–46)
AST: 24 U/L (ref 10–35)
Albumin: 4.5 g/dL (ref 3.6–5.1)
BUN: 14 mg/dL (ref 7–25)
CO2: 24 mmol/L (ref 20–31)
Calcium: 9.4 mg/dL (ref 8.6–10.3)
Chloride: 105 mmol/L (ref 98–110)
Creat: 1.07 mg/dL (ref 0.70–1.33)
Glucose, Bld: 122 mg/dL — ABNORMAL HIGH (ref 70–99)
Potassium: 4.5 mmol/L (ref 3.5–5.3)
Sodium: 140 mmol/L (ref 135–146)
TOTAL PROTEIN: 6.8 g/dL (ref 6.1–8.1)
Total Bilirubin: 0.5 mg/dL (ref 0.2–1.2)

## 2016-09-21 LAB — HEMOGLOBIN A1C
HEMOGLOBIN A1C: 6.7 % — AB (ref <5.7)
Mean Plasma Glucose: 146 mg/dL

## 2016-09-23 ENCOUNTER — Ambulatory Visit (INDEPENDENT_AMBULATORY_CARE_PROVIDER_SITE_OTHER): Payer: 59 | Admitting: Family Medicine

## 2016-09-23 ENCOUNTER — Encounter: Payer: Self-pay | Admitting: Family Medicine

## 2016-09-23 VITALS — BP 120/68 | HR 72 | Temp 98.7°F | Resp 16 | Ht 73.0 in | Wt 237.0 lb

## 2016-09-23 DIAGNOSIS — E785 Hyperlipidemia, unspecified: Secondary | ICD-10-CM

## 2016-09-23 DIAGNOSIS — E291 Testicular hypofunction: Secondary | ICD-10-CM | POA: Diagnosis not present

## 2016-09-23 DIAGNOSIS — E119 Type 2 diabetes mellitus without complications: Secondary | ICD-10-CM | POA: Diagnosis not present

## 2016-09-23 MED ORDER — ALBUTEROL SULFATE HFA 108 (90 BASE) MCG/ACT IN AERS: INHALATION_SPRAY | RESPIRATORY_TRACT | 3 refills | Status: DC

## 2016-09-23 NOTE — Progress Notes (Signed)
Subjective:    Patient ID: Kyle Campbell, male    DOB: [birth year], 56 y.o.   MRN: [id number]  Medication Refill    The patient is here today for follow-up of his chronic medical conditions. First, the patient has type 2 diabetes mellitus. He is on a combination of metformin and jardiance.  He does report occasional pneumaturia 2 episodes per evening. He also reports occasional diarrhea. Otherwise he denies any complications from the medication. His hemoglobin A1c increased slightly from 6.5-6.7 although still acceptable. His LDL cholesterol is still below 100. He denies any myalgias or right upper quadrant pain. His triglycerides are slightly elevated but still less than 200. He is not engaging in any regular aerobic exercise. He is trying to monitor his diet for carbohydrates. His blood pressure today is 120/68. He denies any chest pain shortness of breath or dyspnea on exertion. His weight today is 237. He has not lost substantial weight on jardiance.   Lab on 09/20/2016  Component Date Value Ref Range Status  . WBC 09/20/2016 7.6  3.8 - 10.8 K/uL Final  . RBC 09/20/2016 4.86  4.20 - 5.80 MIL/uL Final  . Hemoglobin 09/20/2016 15.5  13.0 - 17.0 g/dL Final  . HCT 09/20/2016 46.6  38.5 - 50.0 % Final  . MCV 09/20/2016 95.9  80.0 - 100.0 fL Final  . MCH 09/20/2016 31.9  27.0 - 33.0 pg Final  . MCHC 09/20/2016 33.3  32.0 - 36.0 g/dL Final  . RDW 09/20/2016 13.7  11.0 - 15.0 % Final  . Platelets 09/20/2016 177  140 - 400 K/uL Final  . MPV 09/20/2016 9.7  7.5 - 12.5 fL Final  . Neutro Abs 09/20/2016 4560  1,500 - 7,800 cells/uL Final  . Lymphs Abs 09/20/2016 2204  850 - 3,900 cells/uL Final  . Monocytes Absolute 09/20/2016 380  200 - 950 cells/uL Final  . Eosinophils Absolute 09/20/2016 380  15 - 500 cells/uL Final  . Basophils Absolute 09/20/2016 76  0 - 200 cells/uL Final  . Neutrophils Relative % 09/20/2016 60  % Final  . Lymphocytes Relative 09/20/2016 29  % Final  . Monocytes Relative  09/20/2016 5  % Final  . Eosinophils Relative 09/20/2016 5  % Final  . Basophils Relative 09/20/2016 1  % Final  . Smear Review 09/20/2016 Criteria for review not met   Final  . Sodium 09/20/2016 140  135 - 146 mmol/L Final  . Potassium 09/20/2016 4.5  3.5 - 5.3 mmol/L Final  . Chloride 09/20/2016 105  98 - 110 mmol/L Final  . CO2 09/20/2016 24  20 - 31 mmol/L Final  . Glucose, Bld 09/20/2016 122* 70 - 99 mg/dL Final  . BUN 09/20/2016 14  7 - 25 mg/dL Final  . Creat 09/20/2016 1.07  0.70 - 1.33 mg/dL Final   Comment:   For patients > or = 54 years of age: The upper reference limit for Creatinine is approximately 13% higher for people identified as African-American.     . Total Bilirubin 09/20/2016 0.5  0.2 - 1.2 mg/dL Final  . Alkaline Phosphatase 09/20/2016 72  40 - 115 U/L Final  . AST 09/20/2016 24  10 - 35 U/L Final  . ALT 09/20/2016 40  9 - 46 U/L Final  . Total Protein 09/20/2016 6.8  6.1 - 8.1 g/dL Final  . Albumin 09/20/2016 4.5  3.6 - 5.1 g/dL Final  . Calcium 09/20/2016 9.4  8.6 - 10.3 mg/dL Final  . Hgb A1c  MFr Bld 09/20/2016 6.7* <5.7 % Final   Comment:   For someone without known diabetes, a hemoglobin A1c value of 6.5% or greater indicates that they may have diabetes and this should be confirmed with a follow-up test.   For someone with known diabetes, a value <7% indicates that their diabetes is well controlled and a value greater than or equal to 7% indicates suboptimal control. A1c targets should be individualized based on duration of diabetes, age, comorbid conditions, and other considerations.   Currently, no consensus exists for use of hemoglobin A1c for diagnosis of diabetes for children.     . Mean Plasma Glucose 09/20/2016 146  mg/dL Final  . Cholesterol 09/20/2016 169  <200 mg/dL Final  . Triglycerides 09/20/2016 197* <150 mg/dL Final  . HDL 09/20/2016 34* >40 mg/dL Final  . Total CHOL/HDL Ratio 09/20/2016 5.0* <5.0 Ratio Final  . VLDL 09/20/2016  39* <30 mg/dL Final  . LDL Cholesterol 09/20/2016 96  <100 mg/dL Final   Past Medical History:  Diagnosis Date  . Asthma   . Diabetes mellitus without complication ([place])   . Erectile dysfunction   . GERD (gastroesophageal reflux disease)   . Hyperlipidemia   . Obstructive sleep apnea    compliant with CPAP  . Testosterone deficiency    Past Surgical History:  Procedure Laterality Date  . TONSILLECTOMY     age 81   Current Outpatient Prescriptions on File Prior to Visit  Medication Sig Dispense Refill  . ADVAIR DISKUS 250-50 MCG/DOSE AEPB USE ONE PUFF TWICE A DAY 180 each 3  . JARDIANCE 25 MG TABS tablet TAKE 1 TABLET BY MOUTH ONCE DAILY. 30 tablet 0  . lidocaine (XYLOCAINE) 2 % solution Use as directed 15 mLs in the mouth or throat every 3 (three) hours as needed for mouth pain. 100 mL 0  . metFORMIN (GLUCOPHAGE) 1000 MG tablet TAKE 1 TABLET BY MOUTH TWICE DAILY 60 tablet 0  . traMADol (ULTRAM) 50 MG tablet TAKE 1 TABLET BY MOUTH EVERY 6 HOURS AS NEEDED 90 tablet 2  . UNABLE TO FIND CPAP mask - DX: OSA G47.33 1 Device 0   No current facility-administered medications on file prior to visit.    No Known Allergies Social History   Social History  . Marital status: Married    Spouse name: N/A  . Number of children: N/A  . Years of education: N/A   Occupational History  . Not on file.   Social History Main Topics  . Smoking status: Never Smoker  . Smokeless tobacco: Never Used  . Alcohol use 3.6 oz/week    1 Standard drinks or equivalent, 5 Shots of liquor per week     Comment: weekend only  . Drug use: No  . Sexual activity: Yes     Comment: Married.     Other Topics Concern  . Not on file   Social History Narrative   Has 3 children. One 82 year, and 62 year old twins. Works as Administrator. Occasional  ETOH.       Review of Systems  All other systems reviewed and are negative.      Objective:   Physical Exam  Neck: Neck supple. No JVD present. No  thyromegaly present.  Cardiovascular: Normal rate, regular rhythm, normal heart sounds and intact distal pulses.  Exam reveals no gallop and no friction rub.   No murmur heard. Pulmonary/Chest: Effort normal and breath sounds normal. No respiratory distress. He has no wheezes. He  has no rales.  Abdominal: Soft. Bowel sounds are normal. He exhibits no distension. There is no tenderness. There is no rebound.  Genitourinary: Rectum normal and prostate normal.  Lymphadenopathy:    He has no cervical adenopathy.          Assessment & Plan:  Dyslipidemia  Hypogonadism in male  Controlled type 2 diabetes mellitus without complication, without long-term current use of insulin ([place]) The patient's blood pressure control is acceptable. His LDL is at goal and his triglycerides are acceptable. His hemoglobin A1c is acceptable. I would really like the patient to focus on the next 6 months at losing weight. I believe he can lose 20 pounds, we can stop some of his medication. I gave the option to the patient to discontinue jardiance and replace with victoza to see if that will facilitate some weight loss. He will consider this. Otherwise continue medications at the current dose and follow-up in 6 months

## 2016-10-07 ENCOUNTER — Other Ambulatory Visit: Payer: Self-pay | Admitting: Family Medicine

## 2016-10-10 ENCOUNTER — Other Ambulatory Visit: Payer: Self-pay | Admitting: Family Medicine

## 2016-10-11 NOTE — Telephone Encounter (Signed)
Okay to refill? 

## 2016-10-11 NOTE — Telephone Encounter (Signed)
Last OV 09/23/2016 Last refill 06/29/2016 Ok to refill?

## 2016-10-14 NOTE — Telephone Encounter (Signed)
rx called in

## 2016-11-17 ENCOUNTER — Other Ambulatory Visit: Payer: Self-pay | Admitting: Family Medicine

## 2016-11-17 NOTE — Telephone Encounter (Signed)
Ok to refill 

## 2016-11-18 NOTE — Telephone Encounter (Signed)
ok 

## 2016-11-30 ENCOUNTER — Other Ambulatory Visit: Payer: Self-pay | Admitting: Family Medicine

## 2016-12-22 ENCOUNTER — Other Ambulatory Visit: Payer: Self-pay | Admitting: Family Medicine

## 2016-12-22 NOTE — Telephone Encounter (Signed)
Ok to refill 

## 2016-12-23 NOTE — Telephone Encounter (Signed)
ok 

## 2017-01-11 ENCOUNTER — Other Ambulatory Visit: Payer: Self-pay | Admitting: Family Medicine

## 2017-01-11 NOTE — Telephone Encounter (Signed)
Refill appropriate and filled per protocol. 

## 2017-01-30 ENCOUNTER — Other Ambulatory Visit: Payer: Self-pay | Admitting: Family Medicine

## 2017-01-31 NOTE — Telephone Encounter (Signed)
ok 

## 2017-01-31 NOTE — Telephone Encounter (Signed)
Ok to refill 

## 2017-02-01 NOTE — Telephone Encounter (Signed)
ok 

## 2017-03-08 ENCOUNTER — Other Ambulatory Visit: Payer: Self-pay | Admitting: Family Medicine

## 2017-03-08 NOTE — Telephone Encounter (Signed)
ok 

## 2017-03-08 NOTE — Telephone Encounter (Signed)
Ok to refill 

## 2017-03-09 NOTE — Telephone Encounter (Signed)
Medication called to pharmacy. 

## 2017-03-28 ENCOUNTER — Ambulatory Visit: Payer: 59 | Admitting: Family Medicine

## 2017-04-04 ENCOUNTER — Other Ambulatory Visit: Payer: Self-pay | Admitting: Family Medicine

## 2017-04-08 IMAGING — NM NM GASTRIC EMPTYING
1 series · 10 of 10 positions shown · non-contrast
Comparison: None.

CLINICAL DATA: Nausea and vomiting for several weeks.

EXAM:
NUCLEAR MEDICINE GASTRIC EMPTYING SCAN
TECHNIQUE: After oral ingestion of radiolabeled meal, sequential abdominal
images were obtained for 4 hours. Percentage of activity emptying
the stomach was calculated at 1 hour, 2 hour, 3 hour, and 4 hours.
RADIOPHARMACEUTICALS:  2.8 mCi Yc-IIm sulfur colloid in standardized
meal

[Series 1000: gatric statics (results) · 3.90mm/px · 5 acquisitions, 10 frames shown]
[im 1/5]
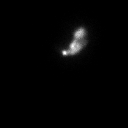
[im 1/5]
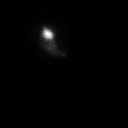
[im 2/5]
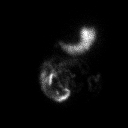
[im 2/5]
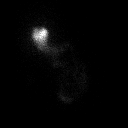
[im 3/5]
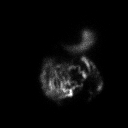
[im 3/5]
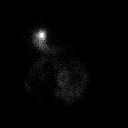
[im 4/5]
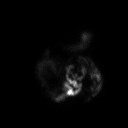
[im 4/5]
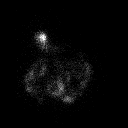
[im 5/5]
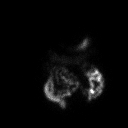
[im 5/5]
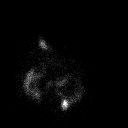

[10 of 10 positions shown; findings below may reference images not displayed]

FINDINGS: Expected location of the stomach in the left upper quadrant.
Ingested meal empties the stomach gradually over the course of the
study.

28% emptied at 1 hr ( normal >= 10%)

64% emptied at 2 hr ( normal >= 40%)

81% emptied at 3 hr ( normal >= 70%)

89% emptied at 4 hr ( normal >= 90%)
IMPRESSION: Normal gastric emptying study.

## 2017-04-14 ENCOUNTER — Other Ambulatory Visit: Payer: Self-pay | Admitting: Family Medicine

## 2017-04-14 DIAGNOSIS — E782 Mixed hyperlipidemia: Secondary | ICD-10-CM

## 2017-04-14 DIAGNOSIS — Z79899 Other long term (current) drug therapy: Secondary | ICD-10-CM

## 2017-04-14 DIAGNOSIS — E1165 Type 2 diabetes mellitus with hyperglycemia: Secondary | ICD-10-CM

## 2017-04-14 NOTE — Telephone Encounter (Signed)
ok 

## 2017-04-14 NOTE — Telephone Encounter (Signed)
Medication called to pharmacy. 

## 2017-04-14 NOTE — Telephone Encounter (Signed)
Ok to refill 

## 2017-04-15 ENCOUNTER — Other Ambulatory Visit: Payer: 59

## 2017-04-15 DIAGNOSIS — Z79899 Other long term (current) drug therapy: Secondary | ICD-10-CM

## 2017-04-15 DIAGNOSIS — E782 Mixed hyperlipidemia: Secondary | ICD-10-CM | POA: Diagnosis not present

## 2017-04-15 DIAGNOSIS — E1165 Type 2 diabetes mellitus with hyperglycemia: Secondary | ICD-10-CM | POA: Diagnosis not present

## 2017-04-16 LAB — LIPID PANEL
CHOLESTEROL: 161 mg/dL (ref <200)
HDL: 34 mg/dL — ABNORMAL LOW (ref >40)
LDL CHOLESTEROL (CALC): 97 mg/dL
Non-HDL Cholesterol (Calc): 127 mg/dL (calc) (ref <130)
TRIGLYCERIDES: 209 mg/dL — AB (ref <150)
Total CHOL/HDL Ratio: 4.7 (calc) (ref <5.0)

## 2017-04-16 LAB — COMPLETE METABOLIC PANEL WITH GFR
AG Ratio: 1.5 (calc) (ref 1.0–2.5)
ALBUMIN MSPROF: 4 g/dL (ref 3.6–5.1)
ALKALINE PHOSPHATASE (APISO): 70 U/L (ref 40–115)
ALT: 38 U/L (ref 9–46)
AST: 24 U/L (ref 10–35)
BILIRUBIN TOTAL: 0.5 mg/dL (ref 0.2–1.2)
BUN: 17 mg/dL (ref 7–25)
CHLORIDE: 102 mmol/L (ref 98–110)
CO2: 29 mmol/L (ref 20–32)
CREATININE: 1.02 mg/dL (ref 0.70–1.33)
Calcium: 9.1 mg/dL (ref 8.6–10.3)
GFR, EST AFRICAN AMERICAN: 96 mL/min/{1.73_m2} (ref >=60)
GFR, Est Non African American: 83 mL/min/{1.73_m2} (ref >=60)
GLOBULIN: 2.6 g/dL (ref 1.9–3.7)
Glucose, Bld: 119 mg/dL — ABNORMAL HIGH (ref 65–99)
Potassium: 4.2 mmol/L (ref 3.5–5.3)
SODIUM: 138 mmol/L (ref 135–146)
TOTAL PROTEIN: 6.6 g/dL (ref 6.1–8.1)

## 2017-04-16 LAB — HEMOGLOBIN A1C
Hgb A1c MFr Bld: 7.2 % of total Hgb — ABNORMAL HIGH (ref <5.7)
Mean Plasma Glucose: 160 (calc)
eAG (mmol/L): 8.9 (calc)

## 2017-04-16 LAB — MICROALBUMIN / CREATININE URINE RATIO: CREATININE, URINE: 87 mg/dL (ref 20–320)

## 2017-04-18 ENCOUNTER — Other Ambulatory Visit: Payer: 59

## 2017-04-21 ENCOUNTER — Ambulatory Visit (INDEPENDENT_AMBULATORY_CARE_PROVIDER_SITE_OTHER): Payer: 59 | Admitting: Family Medicine

## 2017-04-21 ENCOUNTER — Encounter: Payer: Self-pay | Admitting: Family Medicine

## 2017-04-21 ENCOUNTER — Ambulatory Visit: Payer: 59 | Admitting: Family Medicine

## 2017-04-21 VITALS — BP 120/72 | HR 88 | Temp 98.4°F | Resp 18 | Ht 73.0 in | Wt 236.0 lb

## 2017-04-21 DIAGNOSIS — E785 Hyperlipidemia, unspecified: Secondary | ICD-10-CM

## 2017-04-21 DIAGNOSIS — E1100 Type 2 diabetes mellitus with hyperosmolarity without nonketotic hyperglycemic-hyperosmolar coma (NKHHC): Secondary | ICD-10-CM

## 2017-04-21 DIAGNOSIS — Z23 Encounter for immunization: Secondary | ICD-10-CM

## 2017-04-21 NOTE — Progress Notes (Signed)
Subjective:    Patient ID: Kyle Campbell, male    DOB: [birth year], 56 y.o.   MRN: [id number]  Medication Refill    The patient is here today for follow-up of his chronic medical conditions. He denies any chest pain shortness of breath or dyspnea on exertion. His weight today is 237. He has not lost substantial weight on jardiance.  He denies any polyuria, polydipsia, or blurry vision.  He is consistent in taking his metformin and his Jardiance.  He denies any myalgias or right upper quadrant pain.  His most recent lab work as listed below.  Unfortunately his hemoglobin A1c continues to rise. Appointment on 04/15/2017  Component Date Value Ref Range Status  . Creatinine, Urine 04/15/2017 87  20 - 320 mg/dL Final  . Microalb, Ur 40/98/1191 <0.2  mg/dL Final   Comment: Reference Range Not established   . Microalb Creat Ratio 04/15/2017 NOTE  <30 mcg/mg creat Final   Comment: The microalbumin value is less than 0.2 mg/dL therefore we are unable to calculate excretion and/or creatinine ratio. . . The ADA defines abnormalities in albumin excretion as follows: Marland Kitchen Category         Result (mcg/mg creatinine) . Normal                    <30 Microalbuminuria         30-299  Clinical albuminuria   > OR = 300 . The ADA recommends that at least two of three specimens collected within a 3-6 month period be abnormal before considering a patient to be within a diagnostic category.   . Hgb A1c MFr Bld 04/15/2017 7.2* <5.7 % of total Hgb Final   Comment: For someone without known diabetes, a hemoglobin A1c value of 6.5% or greater indicates that they may have  diabetes and this should be confirmed with a follow-up  test. . For someone with known diabetes, a value <7% indicates  that their diabetes is well controlled and a value  greater than or equal to 7% indicates suboptimal  control. A1c targets should be individualized based on  duration of diabetes, age, comorbid conditions, and    other considerations. . Currently, no consensus exists regarding use of hemoglobin A1c for diagnosis of diabetes for children. .   . Mean Plasma Glucose 04/15/2017 160  (calc) Final  . eAG (mmol/L) 04/15/2017 8.9  (calc) Final  . Cholesterol 04/15/2017 161  <200 mg/dL Final  . HDL 47/82/9562 34* >40 mg/dL Final  . Triglycerides 04/15/2017 209* <150 mg/dL Final  . LDL Cholesterol (Calc) 04/15/2017 97  mg/dL (calc) Final   Comment: Reference range: <100 . Desirable range <100 mg/dL for primary prevention;   <70 mg/dL for patients with CHD or diabetic patients  with > or = 2 CHD risk factors. Marland Kitchen LDL-C is now calculated using the Martin-Hopkins  calculation, which is a validated novel method providing  better accuracy than the Friedewald equation in the  estimation of LDL-C.  Horald Pollen et al. Lenox Ahr. 1308;657(84): 2061-2068  (http://education.[facility].com/faq/FAQ164)   . Total CHOL/HDL Ratio 04/15/2017 4.7  <6.9 (calc) Final  . Non-HDL Cholesterol (Calc) 04/15/2017 127  <130 mg/dL (calc) Final   Comment: For patients with diabetes plus 1 major ASCVD risk  factor, treating to a non-HDL-C goal of <100 mg/dL  (LDL-C of <62 mg/dL) is considered a therapeutic  option.   . Glucose, Bld 04/15/2017 119* 65 - 99 mg/dL Final   Comment: .  Fasting reference interval . For someone without known diabetes, a glucose value between 100 and 125 mg/dL is consistent with prediabetes and should be confirmed with a follow-up test. .   . BUN 04/15/2017 17  7 - 25 mg/dL Final  . Creat 16/10/960412/11/2016 1.02  0.70 - 1.33 mg/dL Final   Comment: For patients >54 years of age, the reference limit for Creatinine is approximately 13% higher for people identified as African-American. .   . GFR, Est Non African American 04/15/2017 83  > OR = 60 mL/min/1.6273m2 Final  . GFR, Est African American 04/15/2017 96  > OR = 60 mL/min/1.5473m2 Final  . BUN/Creatinine Ratio 04/15/2017 NOT APPLICABLE  6 -  22 (calc) Final  . Sodium 04/15/2017 138  135 - 146 mmol/L Final  . Potassium 04/15/2017 4.2  3.5 - 5.3 mmol/L Final  . Chloride 04/15/2017 102  98 - 110 mmol/L Final  . CO2 04/15/2017 29  20 - 32 mmol/L Final  . Calcium 04/15/2017 9.1  8.6 - 10.3 mg/dL Final  . Total Protein 04/15/2017 6.6  6.1 - 8.1 g/dL Final  . Albumin 54/09/811912/11/2016 4.0  3.6 - 5.1 g/dL Final  . Globulin 14/78/295612/11/2016 2.6  1.9 - 3.7 g/dL (calc) Final  . AG Ratio 04/15/2017 1.5  1.0 - 2.5 (calc) Final  . Total Bilirubin 04/15/2017 0.5  0.2 - 1.2 mg/dL Final  . Alkaline phosphatase (APISO) 04/15/2017 70  40 - 115 U/L Final  . AST 04/15/2017 24  10 - 35 U/L Final  . ALT 04/15/2017 38  9 - 46 U/L Final   Past Medical History:  Diagnosis Date  . Asthma   . Diabetes mellitus without complication (HCC)   . Erectile dysfunction   . GERD (gastroesophageal reflux disease)   . Hyperlipidemia   . Obstructive sleep apnea    compliant with CPAP  . Testosterone deficiency    Past Surgical History:  Procedure Laterality Date  . TONSILLECTOMY     age 89   Current Outpatient Medications on File Prior to Visit  Medication Sig Dispense Refill  . ADVAIR DISKUS 250-50 MCG/DOSE AEPB INHALE ONE PUFF BY MOUTH TWICE A DAY 180 each 3  . albuterol (PROAIR HFA) 108 (90 Base) MCG/ACT inhaler Inhale one puff by mouth every four hours as needed 8.5 g 3  . JARDIANCE 25 MG TABS tablet TAKE 1 TABLET BY MOUTH ONCE DAILY. 30 tablet 3  . metFORMIN (GLUCOPHAGE) 1000 MG tablet TAKE 1 TABLET BY MOUTH TWICE DAILY 60 tablet 0  . metFORMIN (GLUCOPHAGE) 1000 MG tablet TAKE 1 TABLET BY MOUTH TWICE DAILY 180 tablet 2  . traMADol (ULTRAM) 50 MG tablet TAKE1 TABLET BY MOUTH EVERY 6 HOURS AS NEEDED FOR PAIN 90 tablet 1  . UNABLE TO FIND CPAP mask - DX: OSA G47.33 1 Device 0   No current facility-administered medications on file prior to visit.    No Known Allergies Social History   Socioeconomic History  . Marital status: Married    Spouse name: Not on  file  . Number of children: Not on file  . Years of education: Not on file  . Highest education level: Not on file  Social Needs  . Financial resource strain: Not on file  . Food insecurity - worry: Not on file  . Food insecurity - inability: Not on file  . Transportation needs - medical: Not on file  . Transportation needs - non-medical: Not on file  Occupational History  . Not on file  Tobacco Use  . Smoking status: Never Smoker  . Smokeless tobacco: Never Used  Substance and Sexual Activity  . Alcohol use: Yes    Alcohol/week: 3.6 oz    Types: 1 Standard drinks or equivalent, 5 Shots of liquor per week    Comment: weekend only  . Drug use: No  . Sexual activity: Yes    Comment: Married.    Other Topics Concern  . Not on file  Social History Narrative   Has 3 children. One 220 year, and 54 year old twins. 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       Review of Systems  All other systems reviewed and are negative.      Objective:   Physical Exam  Neck: Neck supple. No JVD present. No thyromegaly present.  Cardiovascular: Normal rate, regular rhythm, normal heart sounds and intact distal pulses. Exam reveals no gallop and no friction rub.  No murmur heard. Pulmonary/Chest: Effort normal and breath sounds normal. No respiratory distress. He has no wheezes. He has no rales.  Abdominal: Soft. Bowel sounds are normal. He exhibits no distension. There is no tenderness. There is no rebound.  Genitourinary: Rectum normal and prostate normal.  Lymphadenopathy:    He has no cervical adenopathy.          Assessment & Plan:  Uncontrolled type 2 diabetes mellitus with hyperosmolarity without coma, without long-term current use of insulin (HCC)  Dyslipidemia Patient's blood pressure is excellent.  I am satisfied with his LDL cholesterol.  I encouraged aerobic exercise to increase his HDL cholesterol.  His urine microalbumin is normal and his diabetic foot exam is normal.  I  am not satisfied with his hemoglobin A1c.  I really want to focus on trying to achieve a substantial weight loss.  I recommended discontinuation of Jardiance and replacement with Trulicity 1.5 mg weekly.  Recheck HgA1c in 3 months

## 2017-04-21 NOTE — Addendum Note (Signed)
Addended by: Legrand RamsWILLIS, Shawnelle Spoerl B on: 04/21/2017 04:58 PM   Modules accepted: Orders

## 2017-05-13 ENCOUNTER — Telehealth: Payer: Self-pay | Admitting: Family Medicine

## 2017-05-13 NOTE — Telephone Encounter (Signed)
Patient is calling to say that the trulicity .75 is working well, got samples of this  Would like to know if this can be called into his pharmacy which is [facility]  [phone]

## 2017-05-15 ENCOUNTER — Other Ambulatory Visit: Payer: Self-pay | Admitting: Family Medicine

## 2017-05-16 ENCOUNTER — Telehealth: Payer: Self-pay | Admitting: Family Medicine

## 2017-05-16 MED ORDER — DULAGLUTIDE 0.75 MG/0.5ML ~~LOC~~ SOAJ: 0.7500 mg | SUBCUTANEOUS | 3 refills | Status: DC

## 2017-05-16 NOTE — Telephone Encounter (Signed)
Patient wanted you to know he will not be taking trulicity anymore

## 2017-05-16 NOTE — Telephone Encounter (Signed)
Medication called/sent to requested pharmacy  

## 2017-05-16 NOTE — Telephone Encounter (Signed)
Requesting Tramadol refill  LOV: 04/21/17  LRF:  04/14/17

## 2017-05-17 MED ORDER — DULAGLUTIDE 0.75 MG/0.5ML ~~LOC~~ SOAJ: 0.7500 mg | SUBCUTANEOUS | 3 refills | Status: DC

## 2017-05-17 NOTE — Telephone Encounter (Signed)
[facility] any [facility].

## 2017-05-17 NOTE — Telephone Encounter (Signed)
Spoke to pt and he states that when he increased the Trulicity to 1.5mg  it made him terribly nauseated as to where he had to vomit a few times. He did do fine with the .75mg . He will continue the .75mg . Med sent to pharm

## 2017-05-17 NOTE — Telephone Encounter (Signed)
Ok continue 0.75 per week.

## 2017-06-06 ENCOUNTER — Telehealth: Payer: Self-pay | Admitting: Family Medicine

## 2017-06-06 MED ORDER — DULAGLUTIDE 0.75 MG/0.5ML ~~LOC~~ SOAJ: 0.7500 mg | SUBCUTANEOUS | 3 refills | Status: DC

## 2017-06-06 NOTE — Telephone Encounter (Signed)
Medication called/sent to requested pharmacy  

## 2017-06-06 NOTE — Telephone Encounter (Signed)
Patient likes the trulicity, however was supposed to be called into the [facility] and they are telling patient that they never got it  [phone]

## 2017-06-08 ENCOUNTER — Telehealth: Payer: Self-pay | Admitting: Family Medicine

## 2017-06-08 NOTE — Telephone Encounter (Signed)
Medication needs PA form insurance - PA Submitted through [email or web address] and received the following:  PA was faxed awaiting response

## 2017-06-08 NOTE — Telephone Encounter (Signed)
PT HAS BEEN UNABLE TO GET TRULICITY FILLED DUE DO TO PHARMACY NEEDING A WRITTEN NOTE STATING THAT HE HAS TRIED USING OTHER DIABETIC MEDS TO HELP WITH HIS SUGAR THAT HAVE FAILED TO WORK. PLEASE FAX NOTE TO [facility].

## 2017-06-13 MED ORDER — DULAGLUTIDE 0.75 MG/0.5ML ~~LOC~~ SOAJ: 0.7500 mg | SUBCUTANEOUS | 3 refills | Status: DC

## 2017-06-13 NOTE — Telephone Encounter (Signed)
PA approved & pharmacy aware. 

## 2017-08-18 ENCOUNTER — Other Ambulatory Visit: Payer: Self-pay | Admitting: *Deleted

## 2017-08-18 MED ORDER — DULAGLUTIDE 1.5 MG/0.5ML ~~LOC~~ SOAJ: SUBCUTANEOUS | 11 refills | Status: DC

## 2017-08-19 ENCOUNTER — Other Ambulatory Visit: Payer: Self-pay | Admitting: Family Medicine

## 2017-08-19 MED ORDER — DULAGLUTIDE 1.5 MG/0.5ML ~~LOC~~ SOAJ: SUBCUTANEOUS | 11 refills | Status: DC

## 2017-09-06 ENCOUNTER — Other Ambulatory Visit: Payer: Self-pay | Admitting: Family Medicine

## 2017-09-06 NOTE — Telephone Encounter (Signed)
Ok to refill??  Last office visit 04/21/2017.  Last refill 05/16/2017, #2 refills.

## 2017-09-26 ENCOUNTER — Other Ambulatory Visit: Payer: Self-pay | Admitting: Family Medicine

## 2017-10-13 ENCOUNTER — Other Ambulatory Visit: Payer: 59

## 2017-10-20 ENCOUNTER — Ambulatory Visit: Payer: 59 | Admitting: Family Medicine

## 2017-11-03 ENCOUNTER — Other Ambulatory Visit: Payer: Self-pay | Admitting: Family Medicine

## 2017-11-03 DIAGNOSIS — E785 Hyperlipidemia, unspecified: Secondary | ICD-10-CM

## 2017-11-03 DIAGNOSIS — Z79899 Other long term (current) drug therapy: Secondary | ICD-10-CM

## 2017-11-03 DIAGNOSIS — R945 Abnormal results of liver function studies: Secondary | ICD-10-CM

## 2017-11-03 DIAGNOSIS — R7989 Other specified abnormal findings of blood chemistry: Secondary | ICD-10-CM

## 2017-11-03 DIAGNOSIS — E1100 Type 2 diabetes mellitus with hyperosmolarity without nonketotic hyperglycemic-hyperosmolar coma (NKHHC): Secondary | ICD-10-CM

## 2017-11-22 ENCOUNTER — Other Ambulatory Visit: Payer: 59

## 2017-11-22 DIAGNOSIS — E291 Testicular hypofunction: Secondary | ICD-10-CM

## 2017-11-22 DIAGNOSIS — E785 Hyperlipidemia, unspecified: Secondary | ICD-10-CM | POA: Diagnosis not present

## 2017-11-22 DIAGNOSIS — R945 Abnormal results of liver function studies: Secondary | ICD-10-CM | POA: Diagnosis not present

## 2017-11-22 DIAGNOSIS — Z79899 Other long term (current) drug therapy: Secondary | ICD-10-CM | POA: Diagnosis not present

## 2017-11-22 DIAGNOSIS — R7989 Other specified abnormal findings of blood chemistry: Secondary | ICD-10-CM

## 2017-11-22 DIAGNOSIS — E1100 Type 2 diabetes mellitus with hyperosmolarity without nonketotic hyperglycemic-hyperosmolar coma (NKHHC): Secondary | ICD-10-CM

## 2017-11-23 LAB — CBC WITH DIFFERENTIAL/PLATELET
BASOS ABS: 59 {cells}/uL (ref 0–200)
Basophils Relative: 0.9 %
EOS PCT: 4.1 %
Eosinophils Absolute: 267 cells/uL (ref 15–500)
HCT: 44.5 % (ref 38.5–50.0)
HEMOGLOBIN: 15.1 g/dL (ref 13.2–17.1)
Lymphs Abs: 1950 cells/uL (ref 850–3900)
MCH: 31.8 pg (ref 27.0–33.0)
MCHC: 33.9 g/dL (ref 32.0–36.0)
MCV: 93.7 fL (ref 80.0–100.0)
MONOS PCT: 6.9 %
MPV: 10.4 fL (ref 7.5–12.5)
NEUTROS ABS: 3777 {cells}/uL (ref 1500–7800)
NEUTROS PCT: 58.1 %
Platelets: 178 10*3/uL (ref 140–400)
RBC: 4.75 10*6/uL (ref 4.20–5.80)
RDW: 12.5 % (ref 11.0–15.0)
TOTAL LYMPHOCYTE: 30 %
WBC mixed population: 449 cells/uL (ref 200–950)
WBC: 6.5 10*3/uL (ref 3.8–10.8)

## 2017-11-23 LAB — LIPID PANEL
Cholesterol: 155 mg/dL (ref <200)
HDL: 31 mg/dL — AB (ref >40)
LDL CHOLESTEROL (CALC): 86 mg/dL
NON-HDL CHOLESTEROL (CALC): 124 mg/dL (ref <130)
TRIGLYCERIDES: 288 mg/dL — AB (ref <150)
Total CHOL/HDL Ratio: 5 (calc) — ABNORMAL HIGH (ref <5.0)

## 2017-11-23 LAB — COMPREHENSIVE METABOLIC PANEL
AG Ratio: 2 (calc) (ref 1.0–2.5)
ALT: 23 U/L (ref 9–46)
AST: 18 U/L (ref 10–35)
Albumin: 4.3 g/dL (ref 3.6–5.1)
Alkaline phosphatase (APISO): 70 U/L (ref 40–115)
BUN: 13 mg/dL (ref 7–25)
CO2: 28 mmol/L (ref 20–32)
CREATININE: 1.04 mg/dL (ref 0.70–1.33)
Calcium: 9.3 mg/dL (ref 8.6–10.3)
Chloride: 103 mmol/L (ref 98–110)
GLUCOSE: 130 mg/dL — AB (ref 65–99)
Globulin: 2.1 g/dL (calc) (ref 1.9–3.7)
Potassium: 4.3 mmol/L (ref 3.5–5.3)
SODIUM: 139 mmol/L (ref 135–146)
TOTAL PROTEIN: 6.4 g/dL (ref 6.1–8.1)
Total Bilirubin: 0.4 mg/dL (ref 0.2–1.2)

## 2017-11-23 LAB — HEMOGLOBIN A1C
EAG (MMOL/L): 6.8 (calc)
HEMOGLOBIN A1C: 5.9 %{Hb} — AB (ref <5.7)
MEAN PLASMA GLUCOSE: 123 (calc)

## 2017-11-23 LAB — TESTOSTERONE: Testosterone: 202 ng/dL — ABNORMAL LOW (ref 250–827)

## 2017-11-24 ENCOUNTER — Encounter: Payer: Self-pay | Admitting: Family Medicine

## 2017-11-24 ENCOUNTER — Ambulatory Visit (INDEPENDENT_AMBULATORY_CARE_PROVIDER_SITE_OTHER): Payer: 59 | Admitting: Family Medicine

## 2017-11-24 VITALS — BP 110/78 | HR 88 | Temp 98.1°F | Resp 16 | Ht 73.0 in | Wt 228.0 lb

## 2017-11-24 DIAGNOSIS — E785 Hyperlipidemia, unspecified: Secondary | ICD-10-CM | POA: Diagnosis not present

## 2017-11-24 DIAGNOSIS — H538 Other visual disturbances: Secondary | ICD-10-CM

## 2017-11-24 DIAGNOSIS — E291 Testicular hypofunction: Secondary | ICD-10-CM | POA: Diagnosis not present

## 2017-11-24 DIAGNOSIS — G459 Transient cerebral ischemic attack, unspecified: Secondary | ICD-10-CM

## 2017-11-24 DIAGNOSIS — E782 Mixed hyperlipidemia: Secondary | ICD-10-CM

## 2017-11-24 DIAGNOSIS — Z79899 Other long term (current) drug therapy: Secondary | ICD-10-CM | POA: Diagnosis not present

## 2017-11-24 DIAGNOSIS — E119 Type 2 diabetes mellitus without complications: Secondary | ICD-10-CM

## 2017-11-24 MED ORDER — ATORVASTATIN CALCIUM 40 MG PO TABS: 40.0000 mg | ORAL_TABLET | Freq: Every day | ORAL | 3 refills | Status: DC

## 2017-11-24 MED ORDER — GLUCOSE BLOOD VI STRP: ORAL_STRIP | 12 refills | Status: AC

## 2017-11-24 MED ORDER — ACCU-CHEK AVIVA PLUS W/DEVICE KIT: PACK | 0 refills | Status: DC

## 2017-11-24 MED ORDER — ACCU-CHEK MULTICLIX LANCETS MISC: 12 refills | Status: AC

## 2017-11-24 NOTE — Progress Notes (Signed)
Subjective:    Patient ID: Kyle Campbell, male    DOB: 1961-03-13, 57 y.o.   MRN: [id number]  Medication Refill    Patient is here today for a follow-up of his chronic medical conditions.  When I last saw him in December, his hemoglobin A1c was greater than 7 and he was continuing to gain weight.  At that point we added Trulicity to his metformin.  He is currently taking Trulicity 1.5 mg weekly.  He is done extremely well and has lost substantial weight on the medication.  His lab work appears much better as his hemoglobin A1c is now 5.9.  His blood pressure is outstanding at 110/78.  All in all, the patient has been doing extremely well up until 2 weeks ago.  2 weeks ago he had a very concerning episode.  He states he suddenly but had blurry vision in both eyes.  He was in [employer] and he was unable to see well enough to put his credit card into the chip scanner.  Everything appeared very blurry.  He had to sit down for 10 minutes and the symptoms slowly gradually resolved and went back to normal.  There were no other neurologic deficits at that time.  He denies any spots in his vision.  He denies any zigzag lines.  Everything just went blurry.  It involved both eyes.  There was no headache associated with it.  He denies any syncope or near syncope.  He denies any chest pain shortness of breath dyspnea on exertion or arrhythmia/palpitations associated with it. Lab on 11/22/2017  Component Date Value Ref Range Status  . WBC 11/22/2017 6.5  3.8 - 10.8 Thousand/uL Final  . RBC 11/22/2017 4.75  4.20 - 5.80 Million/uL Final  . Hemoglobin 11/22/2017 15.1  13.2 - 17.1 g/dL Final  . HCT 40/98/1191 44.5  38.5 - 50.0 % Final  . MCV 11/22/2017 93.7  80.0 - 100.0 fL Final  . MCH 11/22/2017 31.8  27.0 - 33.0 pg Final  . MCHC 11/22/2017 33.9  32.0 - 36.0 g/dL Final  . RDW 47/82/9562 12.5  11.0 - 15.0 % Final  . Platelets 11/22/2017 178  140 - 400 Thousand/uL Final  . MPV 11/22/2017 10.4  7.5 - 12.5 fL  Final  . Neutro Abs 11/22/2017 3,777  1,500 - 7,800 cells/uL Final  . Lymphs Abs 11/22/2017 1,950  850 - 3,900 cells/uL Final  . WBC mixed population 11/22/2017 449  200 - 950 cells/uL Final  . Eosinophils Absolute 11/22/2017 267  15 - 500 cells/uL Final  . Basophils Absolute 11/22/2017 59  0 - 200 cells/uL Final  . Neutrophils Relative % 11/22/2017 58.1  % Final  . Total Lymphocyte 11/22/2017 30.0  % Final  . Monocytes Relative 11/22/2017 6.9  % Final  . Eosinophils Relative 11/22/2017 4.1  % Final  . Basophils Relative 11/22/2017 0.9  % Final  . Glucose, Bld 11/22/2017 130* 65 - 99 mg/dL Final   Comment: .            Fasting reference interval . For someone without known diabetes, a glucose value >125 mg/dL indicates that they may have diabetes and this should be confirmed with a follow-up test. .   . BUN 11/22/2017 13  7 - 25 mg/dL Final  . Creat 13/12/6576 1.04  0.70 - 1.33 mg/dL Final   Comment: For patients >84 years of age, the reference limit for Creatinine is approximately 13% higher for people identified as African-American. Marland Kitchen   Marland Kitchen  BUN/Creatinine Ratio 11/22/2017 NOT APPLICABLE  6 - 22 (calc) Final  . Sodium 11/22/2017 139  135 - 146 mmol/L Final  . Potassium 11/22/2017 4.3  3.5 - 5.3 mmol/L Final  . Chloride 11/22/2017 103  98 - 110 mmol/L Final  . CO2 11/22/2017 28  20 - 32 mmol/L Final  . Calcium 11/22/2017 9.3  8.6 - 10.3 mg/dL Final  . Total Protein 11/22/2017 6.4  6.1 - 8.1 g/dL Final  . Albumin 40/98/119107/16/2019 4.3  3.6 - 5.1 g/dL Final  . Globulin 47/82/956207/16/2019 2.1  1.9 - 3.7 g/dL (calc) Final  . AG Ratio 11/22/2017 2.0  1.0 - 2.5 (calc) Final  . Total Bilirubin 11/22/2017 0.4  0.2 - 1.2 mg/dL Final  . Alkaline phosphatase (APISO) 11/22/2017 70  40 - 115 U/L Final  . AST 11/22/2017 18  10 - 35 U/L Final  . ALT 11/22/2017 23  9 - 46 U/L Final  . Hgb A1c MFr Bld 11/22/2017 5.9* <5.7 % of total Hgb Final   Comment: For someone without known diabetes, a hemoglobin  A1c  value between 5.7% and 6.4% is consistent with prediabetes and should be confirmed with a  follow-up test. . For someone with known diabetes, a value <7% indicates that their diabetes is well controlled. A1c targets should be individualized based on duration of diabetes, age, comorbid conditions, and other considerations. . This assay result is consistent with an increased risk of diabetes. . Currently, no consensus exists regarding use of hemoglobin A1c for diagnosis of diabetes for children. .   . Mean Plasma Glucose 11/22/2017 123  (calc) Final  . eAG (mmol/L) 11/22/2017 6.8  (calc) Final  . Cholesterol 11/22/2017 155  <200 mg/dL Final  . HDL 13/08/657807/16/2019 31* >40 mg/dL Final  . Triglycerides 11/22/2017 288* <150 mg/dL Final  . LDL Cholesterol (Calc) 11/22/2017 86  mg/dL (calc) Final   Comment: Reference range: <100 . Desirable range <100 mg/dL for primary prevention;   <70 mg/dL for patients with CHD or diabetic patients  with > or = 2 CHD risk factors. Marland Kitchen. LDL-C is now calculated using the Martin-Hopkins  calculation, which is a validated novel method providing  better accuracy than the Friedewald equation in the  estimation of LDL-C.  Horald PollenMartin SS et al. Lenox AhrJAMA. 4696;295(282013;310(19): 2061-2068  (http://education.[facility].com/faq/FAQ164)   . Total CHOL/HDL Ratio 11/22/2017 5.0* <4.1<5.0 (calc) Final  . Non-HDL Cholesterol (Calc) 11/22/2017 124  <130 mg/dL (calc) Final   Comment: For patients with diabetes plus 1 major ASCVD risk  factor, treating to a non-HDL-C goal of <100 mg/dL  (LDL-C of <32<70 mg/dL) is considered a therapeutic  option.   . Testosterone 11/22/2017 202* 250 - 827 ng/dL Final   Comment: In hypogonadal males, Testosterone, Total, LC/MS/MS, is the recommended assay due to the diminished accuracy of immunoassay at levels below 250 ng/dL. This test code ([id number]) must be collected in a red-top tube with no gel.     Past Medical History:  Diagnosis Date  . Asthma    . Diabetes mellitus without complication (HCC)   . Erectile dysfunction   . GERD (gastroesophageal reflux disease)   . Hyperlipidemia   . Obstructive sleep apnea    compliant with CPAP  . Testosterone deficiency    Past Surgical History:  Procedure Laterality Date  . TONSILLECTOMY     age 89   Current Outpatient Medications on File Prior to Visit  Medication Sig Dispense Refill  . ADVAIR DISKUS 250-50 MCG/DOSE AEPB INHALE ONE PUFF  BY MOUTH TWICE A DAY 180 each 3  . albuterol (PROAIR HFA) 108 (90 Base) MCG/ACT inhaler INHALE ONE PUFF BY MOUTH EVERY FOUR HOURS AS NEEDED 8.5 g 3  . Dulaglutide (TRULICITY) 1.5 MG/0.5ML SOPN Inject 1.5mg   SQ Q week 2 mL 11  . metFORMIN (GLUCOPHAGE) 1000 MG tablet TAKE 1 TABLET BY MOUTH TWICE DAILY 180 tablet 2  . traMADol (ULTRAM) 50 MG tablet TAKE 1 TABLET BY MOUTH EVERY 6 HOURS AS NEEDED FOR PAIN 90 tablet 2  . UNABLE TO FIND CPAP mask - DX: OSA G47.33 1 Device 0   No current facility-administered medications on file prior to visit.    No Known Allergies Social History   Socioeconomic History  . Marital status: Married    Spouse name: Not on file  . Number of children: Not on file  . Years of education: Not on file  . Highest education level: Not on file  Occupational History  . Not on file  Social Needs  . Financial resource strain: Not on file  . Food insecurity:    Worry: Not on file    Inability: Not on file  . Transportation needs:    Medical: Not on file    Non-medical: Not on file  Tobacco Use  . Smoking status: Never Smoker  . Smokeless tobacco: Never Used  Substance and Sexual Activity  . Alcohol use: Yes    Alcohol/week: 3.6 oz    Types: 1 Standard drinks or equivalent, 5 Shots of liquor per week    Comment: weekend only  . Drug use: No  . Sexual activity: Yes    Comment: Married.    Lifestyle  . Physical activity:    Days per week: Not on file    Minutes per session: Not on file  . Stress: Not on file  Relationships   . Social connections:    Talks on phone: Not on file    Gets together: Not on file    Attends religious service: Not on file    Active member of club or organization: Not on file    Attends meetings of clubs or organizations: Not on file    Relationship status: Not on file  . Intimate partner violence:    Fear of current or ex partner: Not on file    Emotionally abused: Not on file    Physically abused: Not on file    Forced sexual activity: Not on file  Other Topics Concern  . Not on file  Social History Narrative   Has 3 children. One 61 year, and 63 year old twins. 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       Review of Systems  All other systems reviewed and are negative.      Objective:   Physical Exam  Constitutional: He is oriented to person, place, and time. He appears well-developed and well-nourished. No distress.  Eyes: Pupils are equal, round, and reactive to light. Conjunctivae and EOM are normal.  Neck: Neck supple. No JVD present. No thyromegaly present.  Cardiovascular: Normal rate, regular rhythm, normal heart sounds and intact distal pulses. Exam reveals no gallop and no friction rub.  No murmur heard. Pulmonary/Chest: Effort normal and breath sounds normal. No respiratory distress. He has no wheezes. He has no rales.  Abdominal: Soft. Bowel sounds are normal. He exhibits no distension. There is no tenderness. There is no rebound.  Genitourinary: Rectum normal and prostate normal.  Lymphadenopathy:    He has no  cervical adenopathy.  Neurological: He is alert and oriented to person, place, and time. He has normal reflexes. He displays normal reflexes. No cranial nerve deficit. He exhibits normal muscle tone. Coordination normal.  Skin: He is not diaphoretic.          Assessment & Plan:  Blurry vision, bilateral - Plan: ECHOCARDIOGRAM COMPLETE, US Carotid Duplex Bilateral  TIA (transient ischemic attack) - Plan: ECHOCARDIOGRAM COMPLETE, US Carotid  Duplex Bilateral  Dyslipidemia  Encounter for long-term (current) use of medications  Controlled type 2 diabetes mellitus without complication, without long-term current use of insulin (HCC)  Mixed hyperlipidemia  Hypogonadism in male Differential diagnosis includes ocular migraine, TIA, hypoglycemia, hypotension.  However the patient is adamant that he did not feel that he was going to pass out.  He did not feel lightheaded.  His vision just was suddenly blurry.  Most likely this is an ocular migraine or a TIA.  I will treat the patient as though it were a TIA.  I recommended aspirin 81 mg a day.  I have recommended a high intensity statin, Lipitor 40 mg a day.  I will schedule the patient for an echocardiogram of the heart, carotid Doppler as soon as possible.  Otherwise I will make no changes in his medication.  I did recommend that he use a glucometer and if he ever has repeat symptoms, that he check his blood sugar and make sure that there is no hypoglycemia particularly given that his hemoglobin A1c is now 5.9.  However Trulicity and metformin are very unlikely to cause hypoglycemia.  Furthermore the symptoms were not classic for hypoglycemia.  His testosterone was low however I have recommended against treating that until we determine the cause of his blurry vision.

## 2017-11-24 NOTE — Addendum Note (Signed)
Addended by: Legrand RamsWILLIS, Timathy Newberry B on: 11/24/2017 05:13 PM   Modules accepted: Orders

## 2017-12-01 ENCOUNTER — Other Ambulatory Visit: Payer: Self-pay

## 2017-12-01 ENCOUNTER — Ambulatory Visit (HOSPITAL_COMMUNITY): Payer: 59 | Attending: Cardiology

## 2017-12-01 DIAGNOSIS — G4733 Obstructive sleep apnea (adult) (pediatric): Secondary | ICD-10-CM | POA: Insufficient documentation

## 2017-12-01 DIAGNOSIS — E119 Type 2 diabetes mellitus without complications: Secondary | ICD-10-CM | POA: Insufficient documentation

## 2017-12-01 DIAGNOSIS — G459 Transient cerebral ischemic attack, unspecified: Secondary | ICD-10-CM | POA: Diagnosis not present

## 2017-12-01 DIAGNOSIS — H538 Other visual disturbances: Secondary | ICD-10-CM | POA: Insufficient documentation

## 2017-12-01 DIAGNOSIS — E785 Hyperlipidemia, unspecified: Secondary | ICD-10-CM | POA: Diagnosis not present

## 2017-12-01 DIAGNOSIS — J45909 Unspecified asthma, uncomplicated: Secondary | ICD-10-CM | POA: Insufficient documentation

## 2017-12-08 ENCOUNTER — Other Ambulatory Visit: Payer: Self-pay | Admitting: Family Medicine

## 2017-12-08 DIAGNOSIS — G459 Transient cerebral ischemic attack, unspecified: Secondary | ICD-10-CM

## 2017-12-09 ENCOUNTER — Ambulatory Visit (HOSPITAL_COMMUNITY): Payer: 59

## 2017-12-14 ENCOUNTER — Ambulatory Visit (HOSPITAL_COMMUNITY): Payer: 59

## 2017-12-14 ENCOUNTER — Ambulatory Visit (HOSPITAL_COMMUNITY)
Admission: RE | Admit: 2017-12-14 | Discharge: 2017-12-14 | Disposition: A | Discharge status: Home / Self Care | Payer: 59 | Source: Ambulatory Visit | Attending: Family Medicine | Admitting: Family Medicine

## 2017-12-14 DIAGNOSIS — G459 Transient cerebral ischemic attack, unspecified: Secondary | ICD-10-CM | POA: Insufficient documentation

## 2017-12-14 DIAGNOSIS — H539 Unspecified visual disturbance: Secondary | ICD-10-CM | POA: Diagnosis not present

## 2017-12-14 NOTE — Progress Notes (Signed)
Preliminary notes--Bilateral carotid duplex exam completed. Bilateral ICA no significant stenosis. Bilateral vertebral arteries patent with antegrade flow.   Incidental findings:   left neck at ICA/ECA bifurcation hypoechoic oval shape structure seen measuring 1.44x0.70x1.06cm. Heterogenous lesion seen at Thyroid gland measuring 1.26x0.81x0.86cm. Further evaluation needed if neccessary.  Hongying Jorie Zee (RDMS RVT) 12/14/17 1:45 PM

## 2017-12-19 ENCOUNTER — Other Ambulatory Visit: Payer: Self-pay | Admitting: Family Medicine

## 2017-12-19 NOTE — Telephone Encounter (Signed)
Requesting refill    Tramadol  LOV: 11/24/17  LRF:  08/10/17

## 2017-12-29 ENCOUNTER — Other Ambulatory Visit: Payer: Self-pay | Admitting: Family Medicine

## 2018-02-01 ENCOUNTER — Other Ambulatory Visit: Payer: Self-pay | Admitting: Family Medicine

## 2018-03-17 ENCOUNTER — Ambulatory Visit: Payer: 59 | Admitting: Family Medicine

## 2018-03-23 ENCOUNTER — Other Ambulatory Visit: Payer: Self-pay | Admitting: Family Medicine

## 2018-03-23 NOTE — Telephone Encounter (Signed)
Ok to refill??  Last office visit 11/24/2017.  Last refill 12/19/2017, #2 refills.

## 2018-03-29 ENCOUNTER — Other Ambulatory Visit: Payer: 59

## 2018-03-29 DIAGNOSIS — R945 Abnormal results of liver function studies: Secondary | ICD-10-CM

## 2018-03-29 DIAGNOSIS — E1100 Type 2 diabetes mellitus with hyperosmolarity without nonketotic hyperglycemic-hyperosmolar coma (NKHHC): Secondary | ICD-10-CM | POA: Diagnosis not present

## 2018-03-29 DIAGNOSIS — R7989 Other specified abnormal findings of blood chemistry: Secondary | ICD-10-CM

## 2018-03-29 DIAGNOSIS — E785 Hyperlipidemia, unspecified: Secondary | ICD-10-CM | POA: Diagnosis not present

## 2018-03-29 DIAGNOSIS — E291 Testicular hypofunction: Secondary | ICD-10-CM | POA: Diagnosis not present

## 2018-03-29 DIAGNOSIS — Z79899 Other long term (current) drug therapy: Secondary | ICD-10-CM | POA: Diagnosis not present

## 2018-03-30 LAB — COMPREHENSIVE METABOLIC PANEL
AG RATIO: 1.9 (calc) (ref 1.0–2.5)
ALKALINE PHOSPHATASE (APISO): 69 U/L (ref 40–115)
ALT: 17 U/L (ref 9–46)
AST: 16 U/L (ref 10–35)
Albumin: 4.3 g/dL (ref 3.6–5.1)
BILIRUBIN TOTAL: 0.4 mg/dL (ref 0.2–1.2)
BUN: 14 mg/dL (ref 7–25)
CHLORIDE: 104 mmol/L (ref 98–110)
CO2: 29 mmol/L (ref 20–32)
Calcium: 9.6 mg/dL (ref 8.6–10.3)
Creat: 1.07 mg/dL (ref 0.70–1.33)
Globulin: 2.3 g/dL (calc) (ref 1.9–3.7)
Glucose, Bld: 109 mg/dL — ABNORMAL HIGH (ref 65–99)
POTASSIUM: 4.8 mmol/L (ref 3.5–5.3)
Sodium: 141 mmol/L (ref 135–146)
TOTAL PROTEIN: 6.6 g/dL (ref 6.1–8.1)

## 2018-03-30 LAB — HEMOGLOBIN A1C
Hgb A1c MFr Bld: 5.8 % of total Hgb — ABNORMAL HIGH (ref <5.7)
MEAN PLASMA GLUCOSE: 120 (calc)
eAG (mmol/L): 6.6 (calc)

## 2018-03-30 LAB — TESTOSTERONE: Testosterone: 259 ng/dL (ref 250–827)

## 2018-03-30 LAB — CBC WITH DIFFERENTIAL/PLATELET
Basophils Absolute: 63 cells/uL (ref 0–200)
Basophils Relative: 0.9 %
EOS ABS: 273 {cells}/uL (ref 15–500)
Eosinophils Relative: 3.9 %
HCT: 45 % (ref 38.5–50.0)
Hemoglobin: 15.5 g/dL (ref 13.2–17.1)
Lymphs Abs: 2065 cells/uL (ref 850–3900)
MCH: 32.2 pg (ref 27.0–33.0)
MCHC: 34.4 g/dL (ref 32.0–36.0)
MCV: 93.6 fL (ref 80.0–100.0)
MPV: 10.1 fL (ref 7.5–12.5)
Monocytes Relative: 7.2 %
NEUTROS PCT: 58.5 %
Neutro Abs: 4095 cells/uL (ref 1500–7800)
PLATELETS: 190 10*3/uL (ref 140–400)
RBC: 4.81 10*6/uL (ref 4.20–5.80)
RDW: 12.6 % (ref 11.0–15.0)
Total Lymphocyte: 29.5 %
WBC: 7 10*3/uL (ref 3.8–10.8)
WBCMIX: 504 {cells}/uL (ref 200–950)

## 2018-03-30 LAB — LIPID PANEL
Cholesterol: 150 mg/dL (ref <200)
HDL: 38 mg/dL — AB (ref >40)
LDL Cholesterol (Calc): 90 mg/dL (calc)
NON-HDL CHOLESTEROL (CALC): 112 mg/dL (ref <130)
Total CHOL/HDL Ratio: 3.9 (calc) (ref <5.0)
Triglycerides: 119 mg/dL (ref <150)

## 2018-03-31 ENCOUNTER — Ambulatory Visit (INDEPENDENT_AMBULATORY_CARE_PROVIDER_SITE_OTHER): Payer: 59 | Admitting: Family Medicine

## 2018-03-31 ENCOUNTER — Encounter: Payer: Self-pay | Admitting: Family Medicine

## 2018-03-31 VITALS — BP 130/78 | HR 82 | Temp 98.2°F | Resp 16 | Ht 73.0 in | Wt 218.0 lb

## 2018-03-31 DIAGNOSIS — E785 Hyperlipidemia, unspecified: Secondary | ICD-10-CM | POA: Diagnosis not present

## 2018-03-31 DIAGNOSIS — E119 Type 2 diabetes mellitus without complications: Secondary | ICD-10-CM

## 2018-03-31 DIAGNOSIS — E291 Testicular hypofunction: Secondary | ICD-10-CM

## 2018-03-31 MED ORDER — TESTOSTERONE CYPIONATE 200 MG/ML IM SOLN: 100.0000 mg | Freq: Once | INTRAMUSCULAR | 0 refills | Status: DC

## 2018-03-31 MED ORDER — ATORVASTATIN CALCIUM 40 MG PO TABS: 40.0000 mg | ORAL_TABLET | Freq: Every day | ORAL | 3 refills | Status: DC

## 2018-03-31 NOTE — Progress Notes (Signed)
Subjective:    Patient ID: Kyle Campbell, male    DOB: 08-15-1960, 57 y.o.   MRN: [id number]  Medication Refill    Patient is here today for follow-up on his chronic medical conditions.  He is trying to work on his diet exercise and weight loss and is lost an additional 10 pounds.  His blood pressure is well controlled at 130/78.  He continues to control his hemoglobin A1c.  It is 5.8 today dictating outstanding control.  He denies any polyuria, polydipsia, or blurry vision.  He denies any hypoglycemic episodes although he is interested in discontinuing some of his medication.  He discontinued his Lipitor however as a diabetic he would still benefit from being on a moderate intensity statin.  His testosterone remains borderline low.  At his last visit it was 250.  Today is 259.  He reports fatigue, erectile dysfunction, poor libido, and decreasing muscle mass.  We had a long discussion today about the risk of testosterone replacement including increased risk of cardiovascular disease, polycythemia, and prostate cancer.  Patient is willing to accept these risk including DVT in an effort to feel better. Lab on 03/29/2018  Component Date Value Ref Range Status  . Cholesterol 03/29/2018 150  <200 mg/dL Final  . HDL 03/29/2018 38* >40 mg/dL Final  . Triglycerides 03/29/2018 119  <150 mg/dL Final  . LDL Cholesterol (Calc) 03/29/2018 90  mg/dL (calc) Final   Comment: Reference range: <100 . Desirable range <100 mg/dL for primary prevention;   <70 mg/dL for patients with CHD or diabetic patients  with > or = 2 CHD risk factors. Marland Kitchen LDL-C is now calculated using the Martin-Hopkins  calculation, which is a validated novel method providing  better accuracy than the Friedewald equation in the  estimation of LDL-C.  Cresenciano Genre et al. Annamaria Helling. 4037;096(43): 2061-2068  (http://education.[facility].com/faq/FAQ164)   . Total CHOL/HDL Ratio 03/29/2018 3.9  <5.0 (calc) Final  . Non-HDL Cholesterol  (Calc) 03/29/2018 112  <130 mg/dL (calc) Final   Comment: For patients with diabetes plus 1 major ASCVD risk  factor, treating to a non-HDL-C goal of <100 mg/dL  (LDL-C of <70 mg/dL) is considered a therapeutic  option.   . Hgb A1c MFr Bld 03/29/2018 5.8* <5.7 % of total Hgb Final   Comment: For someone without known diabetes, a hemoglobin  A1c value between 5.7% and 6.4% is consistent with prediabetes and should be confirmed with a  follow-up test. . For someone with known diabetes, a value <7% indicates that their diabetes is well controlled. A1c targets should be individualized based on duration of diabetes, age, comorbid conditions, and other considerations. . This assay result is consistent with an increased risk of diabetes. . Currently, no consensus exists regarding use of hemoglobin A1c for diagnosis of diabetes for children. .   . Mean Plasma Glucose 03/29/2018 120  (calc) Final  . eAG (mmol/L) 03/29/2018 6.6  (calc) Final  . Glucose, Bld 03/29/2018 109* 65 - 99 mg/dL Final   Comment: .            Fasting reference interval . For someone without known diabetes, a glucose value between 100 and 125 mg/dL is consistent with prediabetes and should be confirmed with a follow-up test. .   . BUN 03/29/2018 14  7 - 25 mg/dL Final  . Creat 03/29/2018 1.07  0.70 - 1.33 mg/dL Final   Comment: For patients >68 years of age, the reference limit for Creatinine is approximately 13% higher  for people identified as African-American. .   Havery Moros Ratio 49/70/2637 NOT APPLICABLE  6 - 22 (calc) Final  . Sodium 03/29/2018 141  135 - 146 mmol/L Final  . Potassium 03/29/2018 4.8  3.5 - 5.3 mmol/L Final  . Chloride 03/29/2018 104  98 - 110 mmol/L Final  . CO2 03/29/2018 29  20 - 32 mmol/L Final  . Calcium 03/29/2018 9.6  8.6 - 10.3 mg/dL Final  . Total Protein 03/29/2018 6.6  6.1 - 8.1 g/dL Final  . Albumin 03/29/2018 4.3  3.6 - 5.1 g/dL Final  . Globulin 03/29/2018 2.3   1.9 - 3.7 g/dL (calc) Final  . AG Ratio 03/29/2018 1.9  1.0 - 2.5 (calc) Final  . Total Bilirubin 03/29/2018 0.4  0.2 - 1.2 mg/dL Final  . Alkaline phosphatase (APISO) 03/29/2018 69  40 - 115 U/L Final  . AST 03/29/2018 16  10 - 35 U/L Final  . ALT 03/29/2018 17  9 - 46 U/L Final  . WBC 03/29/2018 7.0  3.8 - 10.8 Thousand/uL Final  . RBC 03/29/2018 4.81  4.20 - 5.80 Million/uL Final  . Hemoglobin 03/29/2018 15.5  13.2 - 17.1 g/dL Final  . HCT 03/29/2018 45.0  38.5 - 50.0 % Final  . MCV 03/29/2018 93.6  80.0 - 100.0 fL Final  . MCH 03/29/2018 32.2  27.0 - 33.0 pg Final  . MCHC 03/29/2018 34.4  32.0 - 36.0 g/dL Final  . RDW 03/29/2018 12.6  11.0 - 15.0 % Final  . Platelets 03/29/2018 190  140 - 400 Thousand/uL Final  . MPV 03/29/2018 10.1  7.5 - 12.5 fL Final  . Neutro Abs 03/29/2018 4,095  1,500 - 7,800 cells/uL Final  . Lymphs Abs 03/29/2018 2,065  850 - 3,900 cells/uL Final  . WBC mixed population 03/29/2018 504  200 - 950 cells/uL Final  . Eosinophils Absolute 03/29/2018 273  15 - 500 cells/uL Final  . Basophils Absolute 03/29/2018 63  0 - 200 cells/uL Final  . Neutrophils Relative % 03/29/2018 58.5  % Final  . Total Lymphocyte 03/29/2018 29.5  % Final  . Monocytes Relative 03/29/2018 7.2  % Final  . Eosinophils Relative 03/29/2018 3.9  % Final  . Basophils Relative 03/29/2018 0.9  % Final  . Testosterone 03/29/2018 259  250 - 827 ng/dL Final   Past Medical History:  Diagnosis Date  . Asthma   . Diabetes mellitus without complication ([place])   . Erectile dysfunction   . GERD (gastroesophageal reflux disease)   . Hyperlipidemia   . Obstructive sleep apnea    compliant with CPAP  . Testosterone deficiency    Past Surgical History:  Procedure Laterality Date  . TONSILLECTOMY     age 11   Current Outpatient Medications on File Prior to Visit  Medication Sig Dispense Refill  . ADVAIR DISKUS 250-50 MCG/DOSE AEPB INHALE ONE PUFF BY MOUTH TWICE A DAY 180 each 3  . albuterol  (PROAIR HFA) 108 (90 Base) MCG/ACT inhaler INHALE ONE PUFF BY MOUTH EVERY FOUR HOURS AS NEEDED 8.5 g 3  . Blood Glucose Monitoring Suppl (ACCU-CHEK AVIVA PLUS) w/Device KIT Use to check FBS DX: E11.9 1 kit 0  . Dulaglutide (TRULICITY) 1.5 CH/8.8FO SOPN Inject 1.24m  SQ Q week 2 mL 11  . glucose blood (ACCU-CHEK AVIVA PLUS) test strip Use as instructed 100 each 12  . Lancets (ACCU-CHEK MULTICLIX) lancets Use as instructed 100 each 12  . metFORMIN (GLUCOPHAGE) 1000 MG tablet TAKE 1 TABLET BY MOUTH  TWICE DAILY 180 tablet 2  . traMADol (ULTRAM) 50 MG tablet TAKE 1 TABLET BY MOUTH EVERY 6 HOURS AS NEEDED FOR PAIN 90 tablet 2  . UNABLE TO FIND CPAP mask - DX: OSA G47.33 1 Device 0   No current facility-administered medications on file prior to visit.    No Known Allergies Social History   Socioeconomic History  . Marital status: Married    Spouse name: Not on file  . Number of children: Not on file  . Years of education: Not on file  . Highest education level: Not on file  Occupational History  . Not on file  Social Needs  . Financial resource strain: Not on file  . Food insecurity:    Worry: Not on file    Inability: Not on file  . Transportation needs:    Medical: Not on file    Non-medical: Not on file  Tobacco Use  . Smoking status: Never Smoker  . Smokeless tobacco: Never Used  Substance and Sexual Activity  . Alcohol use: Yes    Alcohol/week: 6.0 standard drinks    Types: 1 Standard drinks or equivalent, 5 Shots of liquor per week    Comment: weekend only  . Drug use: No  . Sexual activity: Yes    Comment: Married.    Lifestyle  . Physical activity:    Days per week: Not on file    Minutes per session: Not on file  . Stress: Not on file  Relationships  . Social connections:    Talks on phone: Not on file    Gets together: Not on file    Attends religious service: Not on file    Active member of club or organization: Not on file    Attends meetings of clubs or  organizations: Not on file    Relationship status: Not on file  . Intimate partner violence:    Fear of current or ex partner: Not on file    Emotionally abused: Not on file    Physically abused: Not on file    Forced sexual activity: Not on file  Other Topics Concern  . Not on file  Social History Narrative   Has 3 children. One 46 year, and 48 year old twins. Works as Administrator. Occasional  ETOH.       Review of Systems  All other systems reviewed and are negative.      Objective:   Physical Exam  Constitutional: He is oriented to person, place, and time. He appears well-developed and well-nourished. No distress.  Eyes: Pupils are equal, round, and reactive to light. Conjunctivae and EOM are normal.  Neck: Neck supple. No JVD present. No thyromegaly present.  Cardiovascular: Normal rate, regular rhythm, normal heart sounds and intact distal pulses. Exam reveals no gallop and no friction rub.  No murmur heard. Pulmonary/Chest: Effort normal and breath sounds normal. No respiratory distress. He has no wheezes. He has no rales.  Abdominal: Soft. Bowel sounds are normal. He exhibits no distension. There is no tenderness. There is no rebound.  Genitourinary: Rectum normal and prostate normal.  Lymphadenopathy:    He has no cervical adenopathy.  Neurological: He is alert and oriented to person, place, and time. He has normal reflexes. No cranial nerve deficit. He exhibits normal muscle tone. Coordination normal.  Skin: He is not diaphoretic.          Assessment & Plan:  Hypogonadism in male - Plan: testosterone cypionate (DEPOTESTOSTERONE CYPIONATE) 200  MG/ML injection  Dyslipidemia  Controlled type 2 diabetes mellitus without complication, without long-term current use of insulin ([place]) Patient has already had his flu shot.  He declines Pneumovax 23.  His diabetes is well controlled.  I think we can discontinue Trulicity but continue metformin.  I do want the patient to  resume Lipitor 40 mg a day.  Blood pressures well controlled.  Recommended diabetic eye exam.  Diabetic foot exam is normal.  We had a long discussion regarding the risk and benefit of treatment of hypogonadism.  Patient elects to start testosterone cypionate 100 mg IM every 2 weeks and recheck a testosterone level, PSA, CBC along with his fasting labs in 3 months.

## 2018-04-03 ENCOUNTER — Telehealth: Payer: Self-pay | Admitting: Family Medicine

## 2018-04-03 NOTE — Telephone Encounter (Signed)
PA Submitted through [email or web address] and received the following:   [employer] is reviewing your PA request. You may close this dialog, return to your dashboard, and perform other tasks.  To check for an update later, open this request again from your dashboard. If [employer] has not replied within 24 hours for urgent requests or within 48 hours for standard requests, please contact [employer] at [phone].

## 2018-04-05 NOTE — Telephone Encounter (Signed)
Approved through insurance from 04/05/18-04/05/2019 pt aware via vm and pharm aware

## 2018-05-27 ENCOUNTER — Telehealth: Payer: Self-pay | Admitting: *Deleted

## 2018-05-27 NOTE — Telephone Encounter (Signed)
Received request from pharmacy for PA on Trulicity.   PA submitted.   Dx: E11.42- DM.

## 2018-05-27 NOTE — Telephone Encounter (Signed)
Your PA has been faxed to the plan as a paper copy. Please contact the plan directly if you haven't received a determination in a typical timeframe.  You will be notified of the determination electronically and via fax. 

## 2018-06-01 NOTE — Telephone Encounter (Signed)
Received PA determination.   PA [phone] does not require PA as it is a covered benefit.

## 2018-06-18 ENCOUNTER — Other Ambulatory Visit: Payer: Self-pay | Admitting: Family Medicine

## 2018-06-19 NOTE — Telephone Encounter (Signed)
Ok to refill??  Last office visit 03/31/2018.  Last refill 03/23/2018, # 2 refills.

## 2018-07-03 ENCOUNTER — Other Ambulatory Visit: Payer: 59

## 2018-09-15 ENCOUNTER — Other Ambulatory Visit: Payer: Self-pay | Admitting: Family Medicine

## 2018-09-15 NOTE — Telephone Encounter (Signed)
Ok to refill??  Last office visit 03/31/2018.  Last refill 06/19/2018, #2 refills.

## 2018-10-15 ENCOUNTER — Other Ambulatory Visit: Payer: Self-pay | Admitting: Family Medicine

## 2018-10-16 NOTE — Telephone Encounter (Signed)
Ok to refill??  Last office visit 03/31/2018.  Last refill 09/15/2018.

## 2018-10-25 LAB — HM DIABETES EYE EXAM

## 2018-11-15 ENCOUNTER — Encounter: Payer: Self-pay | Admitting: *Deleted

## 2018-11-16 ENCOUNTER — Other Ambulatory Visit: Payer: Self-pay | Admitting: Family Medicine

## 2018-11-16 MED ORDER — TRAMADOL HCL 50 MG PO TABS: 50.0000 mg | ORAL_TABLET | Freq: Four times a day (QID) | ORAL | 0 refills | Status: DC | PRN

## 2018-11-16 NOTE — Telephone Encounter (Signed)
REFILL ON TRAMADOL TO [facility]

## 2018-11-16 NOTE — Telephone Encounter (Signed)
Requesting refill    Tramadol  LOV: 03/31/18  LRF:  10/16/18

## 2018-11-17 ENCOUNTER — Other Ambulatory Visit: Payer: Self-pay

## 2018-11-17 ENCOUNTER — Other Ambulatory Visit: Payer: 59

## 2018-11-21 ENCOUNTER — Other Ambulatory Visit: Payer: Self-pay

## 2018-11-21 ENCOUNTER — Other Ambulatory Visit: Payer: 59

## 2018-11-21 DIAGNOSIS — R945 Abnormal results of liver function studies: Secondary | ICD-10-CM | POA: Diagnosis not present

## 2018-11-21 DIAGNOSIS — Z79899 Other long term (current) drug therapy: Secondary | ICD-10-CM | POA: Diagnosis not present

## 2018-11-21 DIAGNOSIS — E785 Hyperlipidemia, unspecified: Secondary | ICD-10-CM

## 2018-11-21 DIAGNOSIS — E1100 Type 2 diabetes mellitus with hyperosmolarity without nonketotic hyperglycemic-hyperosmolar coma (NKHHC): Secondary | ICD-10-CM

## 2018-11-21 DIAGNOSIS — R7989 Other specified abnormal findings of blood chemistry: Secondary | ICD-10-CM

## 2018-11-22 LAB — LIPID PANEL
Cholesterol: 180 mg/dL (ref <200)
HDL: 40 mg/dL (ref >=40)
LDL Cholesterol (Calc): 104 mg/dL (calc) — ABNORMAL HIGH
Non-HDL Cholesterol (Calc): 140 mg/dL (calc) — ABNORMAL HIGH (ref <130)
Total CHOL/HDL Ratio: 4.5 (calc) (ref <5.0)
Triglycerides: 239 mg/dL — ABNORMAL HIGH (ref <150)

## 2018-11-22 LAB — CBC WITH DIFFERENTIAL/PLATELET
Absolute Monocytes: 475 cells/uL (ref 200–950)
Basophils Absolute: 80 cells/uL (ref 0–200)
Basophils Relative: 1.1 %
Eosinophils Absolute: 365 cells/uL (ref 15–500)
Eosinophils Relative: 5 %
HCT: 44.6 % (ref 38.5–50.0)
Hemoglobin: 15.2 g/dL (ref 13.2–17.1)
Lymphs Abs: 2431 cells/uL (ref 850–3900)
MCH: 31.7 pg (ref 27.0–33.0)
MCHC: 34.1 g/dL (ref 32.0–36.0)
MCV: 92.9 fL (ref 80.0–100.0)
MPV: 10.3 fL (ref 7.5–12.5)
Monocytes Relative: 6.5 %
Neutro Abs: 3949 cells/uL (ref 1500–7800)
Neutrophils Relative %: 54.1 %
Platelets: 167 10*3/uL (ref 140–400)
RBC: 4.8 10*6/uL (ref 4.20–5.80)
RDW: 13.1 % (ref 11.0–15.0)
Total Lymphocyte: 33.3 %
WBC: 7.3 10*3/uL (ref 3.8–10.8)

## 2018-11-22 LAB — COMPREHENSIVE METABOLIC PANEL
AG Ratio: 1.8 (calc) (ref 1.0–2.5)
ALT: 26 U/L (ref 9–46)
AST: 15 U/L (ref 10–35)
Albumin: 4.2 g/dL (ref 3.6–5.1)
Alkaline phosphatase (APISO): 85 U/L (ref 35–144)
BUN: 12 mg/dL (ref 7–25)
CO2: 28 mmol/L (ref 20–32)
Calcium: 9.4 mg/dL (ref 8.6–10.3)
Chloride: 103 mmol/L (ref 98–110)
Creat: 1.01 mg/dL (ref 0.70–1.33)
Globulin: 2.4 g/dL (calc) (ref 1.9–3.7)
Glucose, Bld: 216 mg/dL — ABNORMAL HIGH (ref 65–99)
Potassium: 4.1 mmol/L (ref 3.5–5.3)
Sodium: 138 mmol/L (ref 135–146)
Total Bilirubin: 0.6 mg/dL (ref 0.2–1.2)
Total Protein: 6.6 g/dL (ref 6.1–8.1)

## 2018-11-22 LAB — HEMOGLOBIN A1C
Hgb A1c MFr Bld: 8.1 % of total Hgb — ABNORMAL HIGH (ref <5.7)
Mean Plasma Glucose: 186 (calc)
eAG (mmol/L): 10.3 (calc)

## 2018-11-23 ENCOUNTER — Ambulatory Visit: Payer: 59 | Admitting: Family Medicine

## 2018-11-27 ENCOUNTER — Ambulatory Visit (INDEPENDENT_AMBULATORY_CARE_PROVIDER_SITE_OTHER): Payer: 59 | Admitting: Family Medicine

## 2018-11-27 ENCOUNTER — Encounter: Payer: Self-pay | Admitting: Family Medicine

## 2018-11-27 VITALS — BP 131/68 | HR 78 | Temp 99.1°F | Resp 14 | Ht 73.0 in | Wt 233.0 lb

## 2018-11-27 DIAGNOSIS — E1100 Type 2 diabetes mellitus with hyperosmolarity without nonketotic hyperglycemic-hyperosmolar coma (NKHHC): Secondary | ICD-10-CM

## 2018-11-27 DIAGNOSIS — E785 Hyperlipidemia, unspecified: Secondary | ICD-10-CM

## 2018-11-27 DIAGNOSIS — E291 Testicular hypofunction: Secondary | ICD-10-CM | POA: Diagnosis not present

## 2018-11-27 MED ORDER — TRULICITY 1.5 MG/0.5ML ~~LOC~~ SOAJ: SUBCUTANEOUS | 11 refills | Status: DC

## 2018-11-27 MED ORDER — SILDENAFIL CITRATE 100 MG PO TABS: 50.0000 mg | ORAL_TABLET | Freq: Every day | ORAL | 11 refills | Status: DC | PRN

## 2018-11-27 MED ORDER — ATORVASTATIN CALCIUM 10 MG PO TABS: 10.0000 mg | ORAL_TABLET | Freq: Every day | ORAL | 3 refills | Status: DC

## 2018-11-27 NOTE — Progress Notes (Signed)
Subjective:    Patient ID: Kyle ForesterJames E Bilyk, male    DOB: [birth year], 58 y.o.   MRN: [id number]  Medication Refill   Patient is here today for follow-up on his chronic medical conditions.  When I last saw the patient in November, his hemoglobin A1c was outstanding at 5.8 on his combination of Trulicity and metformin.  However, the patient felt poorly taking the medication and discontinue Trulicity and metformin.  He also discontinued his Lipitor and stopped taking an aspirin.  The only medication he is taking right now is testosterone which he takes every 3 weeks.  He also takes tramadol 1 to 2 pills every day for pain in his right shoulder and neck.  He also admits to taking tramadol at night to help him sleep due to the pain in his shoulder and neck.  Without the medication at night he also has symptoms of restless leg syndrome.  If he takes the tramadol this helps calm this down.  He denies any chest pain shortness of breath or dyspnea on exertion.  He denies any polyuria, polydipsia, or blurry vision. Appointment on 11/21/2018  Component Date Value Ref Range Status  . Cholesterol 11/21/2018 180  <200 mg/dL Final  . HDL 40/98/119107/14/2020 40  > OR = 40 mg/dL Final  . Triglycerides 11/21/2018 239* <150 mg/dL Final   Comment: . If a non-fasting specimen was collected, consider repeat triglyceride testing on a fasting specimen if clinically indicated.  Perry MountJacobson et al. J. of Clin. Lipidol. 2015;9:129-169. .   . LDL Cholesterol (Calc) 11/21/2018 104* mg/dL (calc) Final   Comment: Reference range: <100 . Desirable range <100 mg/dL for primary prevention;   <70 mg/dL for patients with CHD or diabetic patients  with > or = 2 CHD risk factors. Marland Kitchen. LDL-C is now calculated using the Martin-Hopkins  calculation, which is a validated novel method providing  better accuracy than the Friedewald equation in the  estimation of LDL-C.  Horald PollenMartin SS et al. Lenox AhrJAMA. 4782;956(212013;310(19): 2061-2068   (http://education.[facility].com/faq/FAQ164)   . Total CHOL/HDL Ratio 11/21/2018 4.5  <3.0<5.0 (calc) Final  . Non-HDL Cholesterol (Calc) 11/21/2018 140* <130 mg/dL (calc) Final   Comment: For patients with diabetes plus 1 major ASCVD risk  factor, treating to a non-HDL-C goal of <100 mg/dL  (LDL-C of <86<70 mg/dL) is considered a therapeutic  option.   . Hgb A1c MFr Bld 11/21/2018 8.1* <5.7 % of total Hgb Final   Comment: For someone without known diabetes, a hemoglobin A1c value of 6.5% or greater indicates that they may have  diabetes and this should be confirmed with a follow-up  test. . For someone with known diabetes, a value <7% indicates  that their diabetes is well controlled and a value  greater than or equal to 7% indicates suboptimal  control. A1c targets should be individualized based on  duration of diabetes, age, comorbid conditions, and  other considerations. . Currently, no consensus exists regarding use of hemoglobin A1c for diagnosis of diabetes for children. .   . Mean Plasma Glucose 11/21/2018 186  (calc) Final  . eAG (mmol/L) 11/21/2018 10.3  (calc) Final  . Glucose, Bld 11/21/2018 216* 65 - 99 mg/dL Final   Comment: .            Fasting reference interval . For someone without known diabetes, a glucose value >125 mg/dL indicates that they may have diabetes and this should be confirmed with a follow-up test. .   . BUN 11/21/2018 12  7 - 25 mg/dL Final  . Creat 11/21/2018 1.01  0.70 - 1.33 mg/dL Final   Comment: For patients >51 years of age, the reference limit for Creatinine is approximately 13% higher for people identified as African-American. .   Havery Moros Ratio 16/02/9603 NOT APPLICABLE  6 - 22 (calc) Final  . Sodium 11/21/2018 138  135 - 146 mmol/L Final  . Potassium 11/21/2018 4.1  3.5 - 5.3 mmol/L Final  . Chloride 11/21/2018 103  98 - 110 mmol/L Final  . CO2 11/21/2018 28  20 - 32 mmol/L Final  . Calcium 11/21/2018 9.4  8.6 - 10.3  mg/dL Final  . Total Protein 11/21/2018 6.6  6.1 - 8.1 g/dL Final  . Albumin 11/21/2018 4.2  3.6 - 5.1 g/dL Final  . Globulin 11/21/2018 2.4  1.9 - 3.7 g/dL (calc) Final  . AG Ratio 11/21/2018 1.8  1.0 - 2.5 (calc) Final  . Total Bilirubin 11/21/2018 0.6  0.2 - 1.2 mg/dL Final  . Alkaline phosphatase (APISO) 11/21/2018 85  35 - 144 U/L Final  . AST 11/21/2018 15  10 - 35 U/L Final  . ALT 11/21/2018 26  9 - 46 U/L Final  . WBC 11/21/2018 7.3  3.8 - 10.8 Thousand/uL Final  . RBC 11/21/2018 4.80  4.20 - 5.80 Million/uL Final  . Hemoglobin 11/21/2018 15.2  13.2 - 17.1 g/dL Final  . HCT 11/21/2018 44.6  38.5 - 50.0 % Final  . MCV 11/21/2018 92.9  80.0 - 100.0 fL Final  . MCH 11/21/2018 31.7  27.0 - 33.0 pg Final  . MCHC 11/21/2018 34.1  32.0 - 36.0 g/dL Final  . RDW 11/21/2018 13.1  11.0 - 15.0 % Final  . Platelets 11/21/2018 167  140 - 400 Thousand/uL Final  . MPV 11/21/2018 10.3  7.5 - 12.5 fL Final  . Neutro Abs 11/21/2018 3,949  1,500 - 7,800 cells/uL Final  . Lymphs Abs 11/21/2018 2,431  850 - 3,900 cells/uL Final  . Absolute Monocytes 11/21/2018 475  200 - 950 cells/uL Final  . Eosinophils Absolute 11/21/2018 365  15 - 500 cells/uL Final  . Basophils Absolute 11/21/2018 80  0 - 200 cells/uL Final  . Neutrophils Relative % 11/21/2018 54.1  % Final  . Total Lymphocyte 11/21/2018 33.3  % Final  . Monocytes Relative 11/21/2018 6.5  % Final  . Eosinophils Relative 11/21/2018 5.0  % Final  . Basophils Relative 11/21/2018 1.1  % Final  Abstract on 11/15/2018  Component Date Value Ref Range Status  . HM Diabetic Eye Exam 10/25/2018 No Retinopathy  No Retinopathy Final   Past Medical History:  Diagnosis Date  . Asthma   . Diabetes mellitus without complication ([place])   . Erectile dysfunction   . GERD (gastroesophageal reflux disease)   . Hyperlipidemia   . Obstructive sleep apnea    compliant with CPAP  . Testosterone deficiency    Past Surgical History:  Procedure Laterality Date   . TONSILLECTOMY     age 52   Current Outpatient Medications on File Prior to Visit  Medication Sig Dispense Refill  . ADVAIR DISKUS 250-50 MCG/DOSE AEPB INHALE ONE PUFF BY MOUTH TWICE A DAY 180 each 3  . albuterol (PROAIR HFA) 108 (90 Base) MCG/ACT inhaler INHALE ONE PUFF BY MOUTH EVERY FOUR HOURS AS NEEDED 8.5 g 3  . testosterone cypionate (DEPOTESTOSTERONE CYPIONATE) 200 MG/ML injection Inject 0.5 mLs (100 mg total) into the muscle once for 1 dose. (Patient taking differently: Inject 100 mg into  the muscle once. Once q 3 months) 4 mL 0  . traMADol (ULTRAM) 50 MG tablet Take 1 tablet (50 mg total) by mouth every 6 (six) hours as needed. for pain 90 tablet 0  . UNABLE TO FIND CPAP mask - DX: OSA G47.33 1 Device 0  . glucose blood (ACCU-CHEK AVIVA PLUS) test strip Use as instructed (Patient not taking: Reported on 11/27/2018) 100 each 12  . Lancets (ACCU-CHEK MULTICLIX) lancets Use as instructed (Patient not taking: Reported on 11/27/2018) 100 each 12  . metFORMIN (GLUCOPHAGE) 1000 MG tablet TAKE 1 TABLET BY MOUTH TWICE DAILY (Patient not taking: Reported on 11/27/2018) 180 tablet 2   No current facility-administered medications on file prior to visit.    No Known Allergies Social History   Socioeconomic History  . Marital status: Married    Spouse name: Not on file  . Number of children: Not on file  . Years of education: Not on file  . Highest education level: Not on file  Occupational History  . Not on file  Social Needs  . Financial resource strain: Not on file  . Food insecurity    Worry: Not on file    Inability: Not on file  . Transportation needs    Medical: Not on file    Non-medical: Not on file  Tobacco Use  . Smoking status: Never Smoker  . Smokeless tobacco: Never Used  Substance and Sexual Activity  . Alcohol use: Yes    Alcohol/week: 6.0 standard drinks    Types: 1 Standard drinks or equivalent, 5 Shots of liquor per week    Comment: weekend only  . Drug use: No   . Sexual activity: Yes    Comment: Married.    Lifestyle  . Physical activity    Days per week: Not on file    Minutes per session: Not on file  . Stress: Not on file  Relationships  . Social Musicianconnections    Talks on phone: Not on file    Gets together: Not on file    Attends religious service: Not on file    Active member of club or organization: Not on file    Attends meetings of clubs or organizations: Not on file    Relationship status: Not on file  . Intimate partner violence    Fear of current or ex partner: Not on file    Emotionally abused: Not on file    Physically abused: Not on file    Forced sexual activity: Not on file  Other Topics Concern  . Not on file  Social History Narrative   Has 3 children. One 6620 year, and 56 year old twins. 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       Review of Systems  All other systems reviewed and are negative.      Objective:   Physical Exam  Constitutional: He is oriented to person, place, and time. He appears well-developed and well-nourished. No distress.  Eyes: Pupils are equal, round, and reactive to light. Conjunctivae and EOM are normal.  Neck: Neck supple. No JVD present. No thyromegaly present.  Cardiovascular: Normal rate, regular rhythm, normal heart sounds and intact distal pulses. Exam reveals no gallop and no friction rub.  No murmur heard. Pulmonary/Chest: Effort normal and breath sounds normal. No respiratory distress. He has no wheezes. He has no rales.  Abdominal: Soft. Bowel sounds are normal. He exhibits no distension. There is no abdominal tenderness. There is no rebound.  Genitourinary:    Prostate and rectum normal.   Lymphadenopathy:    He has no cervical adenopathy.  Neurological: He is alert and oriented to person, place, and time. He has normal reflexes. No cranial nerve deficit. He exhibits normal muscle tone. Coordination normal.  Skin: He is not diaphoretic.          Assessment & Plan:   1. Uncontrolled type 2 diabetes mellitus with hyperosmolarity without coma, without long-term current use of insulin (HCC) After a long discussion, the patient elects to continue Trulicity 1.5 mg every week and reassess fasting lab work in 3 months.  He will try to work on diet exercise and weight loss to bring his A1c below 6.5.  Blood pressures well controlled 2. Dyslipidemia Given his history of diabetes, the patient should be on a statin.  I will start the patient back on Lipitor 10 mg a day.  Also recommended that he take an aspirin 81 mg daily.  3. Hypogonadism in male Continue testosterone at his current dose.

## 2018-12-14 ENCOUNTER — Other Ambulatory Visit: Payer: Self-pay | Admitting: Family Medicine

## 2018-12-14 NOTE — Telephone Encounter (Signed)
Ok to refill??  Last office visit 11/27/2018.  Last refill 11/16/2018

## 2019-01-28 ENCOUNTER — Other Ambulatory Visit: Payer: Self-pay | Admitting: Family Medicine

## 2019-01-29 NOTE — Telephone Encounter (Signed)
Ok to refill??  Last office visit 11/27/2018.  Last refill 12/14/2018.  Ok to add refills to prescription?

## 2019-02-27 ENCOUNTER — Other Ambulatory Visit: Payer: BC Managed Care – PPO

## 2019-02-27 ENCOUNTER — Other Ambulatory Visit: Payer: Self-pay | Admitting: Family Medicine

## 2019-02-27 ENCOUNTER — Other Ambulatory Visit: Payer: Self-pay

## 2019-02-27 DIAGNOSIS — E1100 Type 2 diabetes mellitus with hyperosmolarity without nonketotic hyperglycemic-hyperosmolar coma (NKHHC): Secondary | ICD-10-CM

## 2019-02-27 DIAGNOSIS — E785 Hyperlipidemia, unspecified: Secondary | ICD-10-CM

## 2019-02-28 LAB — COMPREHENSIVE METABOLIC PANEL
AG Ratio: 2 (calc) (ref 1.0–2.5)
ALT: 18 U/L (ref 9–46)
AST: 16 U/L (ref 10–35)
Albumin: 4.1 g/dL (ref 3.6–5.1)
Alkaline phosphatase (APISO): 80 U/L (ref 35–144)
BUN: 10 mg/dL (ref 7–25)
CO2: 27 mmol/L (ref 20–32)
Calcium: 9.1 mg/dL (ref 8.6–10.3)
Chloride: 104 mmol/L (ref 98–110)
Creat: 0.88 mg/dL (ref 0.70–1.33)
Globulin: 2.1 g/dL (calc) (ref 1.9–3.7)
Glucose, Bld: 122 mg/dL — ABNORMAL HIGH (ref 65–99)
Potassium: 4.3 mmol/L (ref 3.5–5.3)
Sodium: 141 mmol/L (ref 135–146)
Total Bilirubin: 0.4 mg/dL (ref 0.2–1.2)
Total Protein: 6.2 g/dL (ref 6.1–8.1)

## 2019-02-28 LAB — LIPID PANEL
Cholesterol: 123 mg/dL (ref <200)
HDL: 36 mg/dL — ABNORMAL LOW (ref >=40)
LDL Cholesterol (Calc): 67 mg/dL (calc)
Non-HDL Cholesterol (Calc): 87 mg/dL (calc) (ref <130)
Total CHOL/HDL Ratio: 3.4 (calc) (ref <5.0)
Triglycerides: 117 mg/dL (ref <150)

## 2019-02-28 LAB — HEMOGLOBIN A1C
Hgb A1c MFr Bld: 6.4 % of total Hgb — ABNORMAL HIGH (ref <5.7)
Mean Plasma Glucose: 137 (calc)
eAG (mmol/L): 7.6 (calc)

## 2019-03-01 ENCOUNTER — Other Ambulatory Visit: Payer: Self-pay

## 2019-03-01 ENCOUNTER — Ambulatory Visit: Payer: BC Managed Care – PPO | Admitting: Family Medicine

## 2019-03-01 ENCOUNTER — Encounter: Payer: Self-pay | Admitting: Family Medicine

## 2019-03-01 VITALS — BP 100/68 | HR 76 | Temp 97.8°F | Resp 16 | Ht 73.0 in | Wt 222.0 lb

## 2019-03-01 DIAGNOSIS — Z23 Encounter for immunization: Secondary | ICD-10-CM

## 2019-03-01 DIAGNOSIS — E119 Type 2 diabetes mellitus without complications: Secondary | ICD-10-CM | POA: Diagnosis not present

## 2019-03-01 DIAGNOSIS — E349 Endocrine disorder, unspecified: Secondary | ICD-10-CM | POA: Diagnosis not present

## 2019-03-01 DIAGNOSIS — E785 Hyperlipidemia, unspecified: Secondary | ICD-10-CM | POA: Diagnosis not present

## 2019-03-01 NOTE — Progress Notes (Signed)
Subjective:    Patient ID: Kyle ForesterJames E Campbell, male    DOB: [birth year], 58 y.o.   MRN: [id number]  Medication Refill   Patient is here today for follow-up.  Since I last saw the patient, he is resume Trulicity.  His hemoglobin A1c has fallen to 6.4 and is now well controlled.  He denies any hypoglycemia.  Since starting the Trulicity, he has noticed some early morning nausea however once that passes in the morning he feels fine.  He is also successfully lost 11 pounds from 233 down to 222.  He denies any neuropathy in his feet.  He denies any numbness or tingling or burning pain.  He does have a small preulcerative callus on the tip of his right second toe that I recommended he gently smoothed with an emery board and apply Vaseline 2 on a semiregular basis to keep soft.  Otherwise his foot exam is normal.  I did recommend an annual diabetic eye exam as this is due.  His blood pressure today is low at 100/68.  Patient denies any chest pain shortness of breath or dyspnea on exertion.  I reviewed his cholesterol panel.  He is currently on Lipitor.  His LDL cholesterol has now fallen to 67 and his triglycerides have dropped over 100 points.  He denies any myalgias or right upper quadrant pain on the Lipitor.  He is tolerating this well.  He also is taking aspirin.  He denies any bleeding or bruising.  We have discontinued testosterone replacement as the patient saw no benefit from the testosterone.  However he is long overdue for a PSA to screen for prostate cancer. Appointment on 02/27/2019  Component Date Value Ref Range Status  . Hgb A1c MFr Bld 02/27/2019 6.4* <5.7 % of total Hgb Final   Comment: For someone without known diabetes, a hemoglobin  A1c value between 5.7% and 6.4% is consistent with prediabetes and should be confirmed with a  follow-up test. . For someone with known diabetes, a value <7% indicates that their diabetes is well controlled. A1c targets should be individualized based on  duration of diabetes, age, comorbid conditions, and other considerations. . This assay result is consistent with an increased risk of diabetes. . Currently, no consensus exists regarding use of hemoglobin A1c for diagnosis of diabetes for children. .   . Mean Plasma Glucose 02/27/2019 137  (calc) Final  . eAG (mmol/L) 02/27/2019 7.6  (calc) Final  . Glucose, Bld 02/27/2019 122* 65 - 99 mg/dL Final   Comment: .            Fasting reference interval . For someone without known diabetes, a glucose value between 100 and 125 mg/dL is consistent with prediabetes and should be confirmed with a follow-up test. .   . BUN 02/27/2019 10  7 - 25 mg/dL Final  . Creat 40/98/119110/20/2020 0.88  0.70 - 1.33 mg/dL Final   Comment: For patients >56 years of age, the reference limit for Creatinine is approximately 13% higher for people identified as African-American. .   Edwena Felty. BUN/Creatinine Ratio 02/27/2019 NOT APPLICABLE  6 - 22 (calc) Final  . Sodium 02/27/2019 141  135 - 146 mmol/L Final  . Potassium 02/27/2019 4.3  3.5 - 5.3 mmol/L Final  . Chloride 02/27/2019 104  98 - 110 mmol/L Final  . CO2 02/27/2019 27  20 - 32 mmol/L Final  . Calcium 02/27/2019 9.1  8.6 - 10.3 mg/dL Final  . Total Protein 02/27/2019 6.2  6.1 -  8.1 g/dL Final  . Albumin 56/21/3086 4.1  3.6 - 5.1 g/dL Final  . Globulin 57/84/6962 2.1  1.9 - 3.7 g/dL (calc) Final  . AG Ratio 02/27/2019 2.0  1.0 - 2.5 (calc) Final  . Total Bilirubin 02/27/2019 0.4  0.2 - 1.2 mg/dL Final  . Alkaline phosphatase (APISO) 02/27/2019 80  35 - 144 U/L Final  . AST 02/27/2019 16  10 - 35 U/L Final  . ALT 02/27/2019 18  9 - 46 U/L Final  . Cholesterol 02/27/2019 123  <200 mg/dL Final  . HDL 95/28/4132 36* > OR = 40 mg/dL Final  . Triglycerides 02/27/2019 117  <150 mg/dL Final  . LDL Cholesterol (Calc) 02/27/2019 67  mg/dL (calc) Final   Comment: Reference range: <100 . Desirable range <100 mg/dL for primary prevention;   <70 mg/dL for patients with  CHD or diabetic patients  with > or = 2 CHD risk factors. Marland Kitchen LDL-C is now calculated using the Martin-Hopkins  calculation, which is a validated novel method providing  better accuracy than the Friedewald equation in the  estimation of LDL-C.  Horald Pollen et al. Lenox Ahr. 4401;027(25): 2061-2068  (http://education.[facility].com/faq/FAQ164)   . Total CHOL/HDL Ratio 02/27/2019 3.4  <3.6 (calc) Final  . Non-HDL Cholesterol (Calc) 02/27/2019 87  <130 mg/dL (calc) Final   Comment: For patients with diabetes plus 1 major ASCVD risk  factor, treating to a non-HDL-C goal of <100 mg/dL  (LDL-C of <64 mg/dL) is considered a therapeutic  option.    Past Medical History:  Diagnosis Date  . Asthma   . Diabetes mellitus without complication ([facility])   . Erectile dysfunction   . GERD (gastroesophageal reflux disease)   . Hyperlipidemia   . Obstructive sleep apnea    compliant with CPAP  . Testosterone deficiency    Past Surgical History:  Procedure Laterality Date  . TONSILLECTOMY     age 35   Current Outpatient Medications on File Prior to Visit  Medication Sig Dispense Refill  . ADVAIR DISKUS 250-50 MCG/DOSE AEPB INHALE ONE PUFF BY MOUTH TWICE A DAY 180 each 3  . albuterol (PROAIR HFA) 108 (90 Base) MCG/ACT inhaler INHALE ONE PUFF BY MOUTH EVERY FOUR HOURS AS NEEDED 8.5 g 3  . atorvastatin (LIPITOR) 10 MG tablet Take 1 tablet (10 mg total) by mouth daily. 90 tablet 3  . Dulaglutide (TRULICITY) 1.5 MG/0.5ML SOPN Inject 1.5mg   SQ Q week 2 mL 11  . glucose blood (ACCU-CHEK AVIVA PLUS) test strip Use as instructed 100 each 12  . Lancets (ACCU-CHEK MULTICLIX) lancets Use as instructed 100 each 12  . metFORMIN (GLUCOPHAGE) 1000 MG tablet TAKE 1 TABLET BY MOUTH TWICE DAILY 180 tablet 2  . sildenafil (VIAGRA) 100 MG tablet Take 0.5-1 tablets (50-100 mg total) by mouth daily as needed for erectile dysfunction. 5 tablet 11  . traMADol (ULTRAM) 50 MG tablet TAKE 1 TABLET (50 MG TOTAL) BY MOUTH EVERY  6 (SIX) HOURS AS NEEDED. FOR PAIN 90 tablet 2  . UNABLE TO FIND CPAP mask - DX: OSA G47.33 1 Device 0  . testosterone cypionate (DEPOTESTOSTERONE CYPIONATE) 200 MG/ML injection Inject 0.5 mLs (100 mg total) into the muscle once for 1 dose. (Patient taking differently: Inject 100 mg into the muscle once. Once q 3 months) 4 mL 0   No current facility-administered medications on file prior to visit.    No Known Allergies Social History   Socioeconomic History  . Marital status: Married    Spouse name:  Not on file  . Number of children: Not on file  . Years of education: Not on file  . Highest education level: Not on file  Occupational History  . Not on file  Social Needs  . Financial resource strain: Not on file  . Food insecurity    Worry: Not on file    Inability: Not on file  . Transportation needs    Medical: Not on file    Non-medical: Not on file  Tobacco Use  . Smoking status: Never Smoker  . Smokeless tobacco: Never Used  Substance and Sexual Activity  . Alcohol use: Yes    Alcohol/week: 6.0 standard drinks    Types: 1 Standard drinks or equivalent, 5 Shots of liquor per week    Comment: weekend only  . Drug use: No  . Sexual activity: Yes    Comment: Married.    Lifestyle  . Physical activity    Days per week: Not on file    Minutes per session: Not on file  . Stress: Not on file  Relationships  . Social Musician on phone: Not on file    Gets together: Not on file    Attends religious service: Not on file    Active member of club or organization: Not on file    Attends meetings of clubs or organizations: Not on file    Relationship status: Not on file  . Intimate partner violence    Fear of current or ex partner: Not on file    Emotionally abused: Not on file    Physically abused: Not on file    Forced sexual activity: Not on file  Other Topics Concern  . Not on file  Social History Narrative   Has 3 children. One 61 year, and 36 year old  twins. 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       Review of Systems  All other systems reviewed and are negative.      Objective:   Physical Exam  Constitutional: He is oriented to person, place, and time. He appears well-developed and well-nourished. No distress.  Eyes: Pupils are equal, round, and reactive to light. Conjunctivae and EOM are normal.  Neck: Neck supple. No JVD present. No thyromegaly present.  Cardiovascular: Normal rate, regular rhythm, normal heart sounds and intact distal pulses. Exam reveals no gallop and no friction rub.  No murmur heard. Pulmonary/Chest: Effort normal and breath sounds normal. No respiratory distress. He has no wheezes. He has no rales.  Abdominal: Soft. Bowel sounds are normal. He exhibits no distension. There is no abdominal tenderness. There is no rebound.  Genitourinary:    Prostate and rectum normal.   Lymphadenopathy:    He has no cervical adenopathy.  Neurological: He is alert and oriented to person, place, and time. He has normal reflexes. No cranial nerve deficit. He exhibits normal muscle tone. Coordination normal.  Skin: He is not diaphoretic.          Assessment & Plan:  Controlled type 2 diabetes mellitus without complication, without long-term current use of insulin (HCC)  Dyslipidemia  Testosterone deficiency  Hemoglobin A1c has fallen from greater than 8-6.4.  I congratulated the patient on his improvement over the last few months.  He is tolerating the Trulicity without significant side effects.  He elects to stay on Trulicity despite the early morning nausea.  I have recommended repeating a hemoglobin A1c in 6 months.  Diabetic foot exam was performed  today and is normal.  I did recommend a diabetic eye exam.  Patient is also taking an aspirin.  His blood pressure is too low for me to comfortably put the patient on an ACE inhibitor or an angiotensin receptor blocker.  His LDL cholesterol has improved dramatically  and is now 67.  His triglycerides have dropped over 100 points.  I congratulated the patient on the success.  He is also lost 11 pounds.  We will continue atorvastatin albeit at a low dose.  Patient denies any myalgias.  He seems to be tolerating the medication without difficulty.  Regarding his testosterone deficiency, together we have decided not to take testosterone due to the long-term risk and his lack of subjective benefit when he tried testosterone in the past.  However he is overdue for a PSA and therefore I recommended when he return in 6 months we recheck a PSA.  He did receive his flu shot today.

## 2019-03-05 NOTE — Addendum Note (Signed)
Addended by: Shary Decamp B on: 03/05/2019 03:54 PM   Modules accepted: Orders

## 2019-04-12 ENCOUNTER — Other Ambulatory Visit: Payer: Self-pay | Admitting: Family Medicine

## 2019-04-12 NOTE — Telephone Encounter (Signed)
Ok to refill??  Last office visit 03/01/2019.  Last refill 01/29/2019, #2 refills.

## 2019-05-06 ENCOUNTER — Other Ambulatory Visit: Payer: Self-pay | Admitting: Family Medicine

## 2019-05-07 NOTE — Telephone Encounter (Signed)
Last office visit: 03/01/2019 Last refilled: 04/12/2019

## 2019-05-30 ENCOUNTER — Other Ambulatory Visit: Payer: Self-pay | Admitting: Family Medicine

## 2019-05-30 NOTE — Telephone Encounter (Signed)
Ok to refill??  Last office visit 03/01/2019.  Last refill 05/07/2019.

## 2019-06-21 ENCOUNTER — Other Ambulatory Visit: Payer: Self-pay | Admitting: Family Medicine

## 2019-06-21 NOTE — Telephone Encounter (Signed)
Ok to refill??  Last office visit 03/01/2019.  Last refill 05/31/2019, #2 refills.

## 2019-07-06 ENCOUNTER — Other Ambulatory Visit: Payer: Self-pay | Admitting: Family Medicine

## 2019-07-16 ENCOUNTER — Other Ambulatory Visit: Payer: Self-pay | Admitting: Family Medicine

## 2019-07-16 NOTE — Telephone Encounter (Signed)
Ok to refill??  Last office visit 03/01/2019.  Last refill 06/22/2019.

## 2019-08-08 ENCOUNTER — Other Ambulatory Visit: Payer: Self-pay | Admitting: Family Medicine

## 2019-08-08 NOTE — Telephone Encounter (Signed)
Pt requesting refill on Tramadol      LOV: 03/01/19  LRF:  07/16/2019

## 2019-08-27 ENCOUNTER — Other Ambulatory Visit: Payer: BC Managed Care – PPO

## 2019-08-28 ENCOUNTER — Other Ambulatory Visit: Payer: BC Managed Care – PPO

## 2019-08-28 ENCOUNTER — Other Ambulatory Visit: Payer: Self-pay

## 2019-08-28 DIAGNOSIS — Z79899 Other long term (current) drug therapy: Secondary | ICD-10-CM

## 2019-08-28 DIAGNOSIS — E1100 Type 2 diabetes mellitus with hyperosmolarity without nonketotic hyperglycemic-hyperosmolar coma (NKHHC): Secondary | ICD-10-CM

## 2019-08-28 DIAGNOSIS — R7989 Other specified abnormal findings of blood chemistry: Secondary | ICD-10-CM

## 2019-08-28 DIAGNOSIS — E785 Hyperlipidemia, unspecified: Secondary | ICD-10-CM

## 2019-08-30 ENCOUNTER — Other Ambulatory Visit: Payer: Self-pay

## 2019-08-30 ENCOUNTER — Encounter: Payer: Self-pay | Admitting: Family Medicine

## 2019-08-30 ENCOUNTER — Ambulatory Visit: Payer: BC Managed Care – PPO | Admitting: Family Medicine

## 2019-08-30 ENCOUNTER — Other Ambulatory Visit: Payer: Self-pay | Admitting: Family Medicine

## 2019-08-30 VITALS — BP 116/68 | HR 80 | Temp 97.1°F | Resp 16 | Ht 73.0 in | Wt 222.0 lb

## 2019-08-30 DIAGNOSIS — Z125 Encounter for screening for malignant neoplasm of prostate: Secondary | ICD-10-CM

## 2019-08-30 DIAGNOSIS — E119 Type 2 diabetes mellitus without complications: Secondary | ICD-10-CM

## 2019-08-30 DIAGNOSIS — Z1159 Encounter for screening for other viral diseases: Secondary | ICD-10-CM

## 2019-08-30 DIAGNOSIS — E785 Hyperlipidemia, unspecified: Secondary | ICD-10-CM

## 2019-08-30 NOTE — Telephone Encounter (Signed)
Ok to refill??  Last office visit 03/01/2019.  Last refill 08/09/2019.

## 2019-08-30 NOTE — Progress Notes (Signed)
Subjective:    Patient ID: Kyle Campbell, male    DOB: Feb 15, 1961, 59 y.o.   MRN: [id number]  Medication Refill   Patient is a very pleasant 59 year old Caucasian male here today for a follow-up.  He has a history of type 2 diabetes mellitus.  He is no longer taking Metformin.  Despite this showing up on his medication list he is only taking Trulicity.  Despite only being on the Trulicity, his hemoglobin A1c has fallen from 6.4-6.1.  He denies any nausea.  He denies any vomiting.  He denies any hypoglycemic episodes.  He is due today for diabetic foot exam.  This was performed and is completely normal.  He has excellent sensation to 10 g monofilament bilaterally.  He has normal pulses and no evidence of skin breakdown or ulceration or preulcerative callus.  He is due for HIV screening which he politely declines.  He is also due for hepatitis C screening which he is willing to accept.  He is overdue for PSA.  His last colonoscopy was in 2012.  He is due for that next year.  He is due for Pneumovax 23.  We discussed this at length and the patient declines this at the present time.  He is also due for the Covid vaccination.  Patient has reservations regarding the Covid vaccination.  I explained to the patient my opinion that the Covid vaccination is beneficial and necessary.  I recommended that strongly however the patient is hesitant to receive it at the present time.  Patient denies any neuropathy in his feet.  He denies any chest pain shortness of breath or dyspnea on exertion.  He denies any myalgias or right upper quadrant pain.  He is having to take tramadol 3 a day for his back pain and occasional knee pain but other than that is doing quite well. Appointment on 08/28/2019  Component Date Value Ref Range Status  . WBC 08/28/2019 6.6  3.8 - 10.8 Thousand/uL Final  . RBC 08/28/2019 4.67  4.20 - 5.80 Million/uL Final  . Hemoglobin 08/28/2019 14.7  13.2 - 17.1 g/dL Final  . HCT 68/04/7516 44.3  38.5  - 50.0 % Final  . MCV 08/28/2019 94.9  80.0 - 100.0 fL Final  . MCH 08/28/2019 31.5  27.0 - 33.0 pg Final  . MCHC 08/28/2019 33.2  32.0 - 36.0 g/dL Final  . RDW 00/17/4944 13.1  11.0 - 15.0 % Final  . Platelets 08/28/2019 179  140 - 400 Thousand/uL Final  . MPV 08/28/2019 10.0  7.5 - 12.5 fL Final  . Neutro Abs 08/28/2019 3,881  1,500 - 7,800 cells/uL Final  . Lymphs Abs 08/28/2019 1,795  850 - 3,900 cells/uL Final  . Absolute Monocytes 08/28/2019 436  200 - 950 cells/uL Final  . Eosinophils Absolute 08/28/2019 429  15 - 500 cells/uL Final  . Basophils Absolute 08/28/2019 59  0 - 200 cells/uL Final  . Neutrophils Relative % 08/28/2019 58.8  % Final  . Total Lymphocyte 08/28/2019 27.2  % Final  . Monocytes Relative 08/28/2019 6.6  % Final  . Eosinophils Relative 08/28/2019 6.5  % Final  . Basophils Relative 08/28/2019 0.9  % Final  . Glucose, Bld 08/28/2019 128* 65 - 99 mg/dL Final   Comment: .            Fasting reference interval . For someone without known diabetes, a glucose value >125 mg/dL indicates that they may have diabetes and this should be confirmed with a  follow-up test. .   . BUN 08/28/2019 10  7 - 25 mg/dL Final  . Creat 25/09/3974 0.99  0.70 - 1.33 mg/dL Final   Comment: For patients >42 years of age, the reference limit for Creatinine is approximately 13% higher for people identified as African-American. .   Edwena Felty Ratio 08/28/2019 NOT APPLICABLE  6 - 22 (calc) Final  . Sodium 08/28/2019 142  135 - 146 mmol/L Final  . Potassium 08/28/2019 4.6  3.5 - 5.3 mmol/L Final  . Chloride 08/28/2019 106  98 - 110 mmol/L Final  . CO2 08/28/2019 30  20 - 32 mmol/L Final  . Calcium 08/28/2019 9.2  8.6 - 10.3 mg/dL Final  . Total Protein 08/28/2019 6.2  6.1 - 8.1 g/dL Final  . Albumin 73/41/9379 4.1  3.6 - 5.1 g/dL Final  . Globulin 02/40/9735 2.1  1.9 - 3.7 g/dL (calc) Final  . AG Ratio 08/28/2019 2.0  1.0 - 2.5 (calc) Final  . Total Bilirubin 08/28/2019 0.4   0.2 - 1.2 mg/dL Final  . Alkaline phosphatase (APISO) 08/28/2019 70  35 - 144 U/L Final  . AST 08/28/2019 18  10 - 35 U/L Final  . ALT 08/28/2019 21  9 - 46 U/L Final  . Hgb A1c MFr Bld 08/28/2019 6.1* <5.7 % of total Hgb Final   Comment: For someone without known diabetes, a hemoglobin  A1c value between 5.7% and 6.4% is consistent with prediabetes and should be confirmed with a  follow-up test. . For someone with known diabetes, a value <7% indicates that their diabetes is well controlled. A1c targets should be individualized based on duration of diabetes, age, comorbid conditions, and other considerations. . This assay result is consistent with an increased risk of diabetes. . Currently, no consensus exists regarding use of hemoglobin A1c for diagnosis of diabetes for children. .   . Mean Plasma Glucose 08/28/2019 128  (calc) Final  . eAG (mmol/L) 08/28/2019 7.1  (calc) Final  . Cholesterol 08/28/2019 121  <200 mg/dL Final  . HDL 32/99/2426 36* > OR = 40 mg/dL Final  . Triglycerides 08/28/2019 96  <150 mg/dL Final  . LDL Cholesterol (Calc) 08/28/2019 67  mg/dL (calc) Final   Comment: Reference range: <100 . Desirable range <100 mg/dL for primary prevention;   <70 mg/dL for patients with CHD or diabetic patients  with > or = 2 CHD risk factors. Marland Kitchen LDL-C is now calculated using the Martin-Hopkins  calculation, which is a validated novel method providing  better accuracy than the Friedewald equation in the  estimation of LDL-C.  Horald Pollen et al. Lenox Ahr. 8341;962(22): 2061-2068  (http://education.[facility].com/faq/FAQ164)   . Total CHOL/HDL Ratio 08/28/2019 3.4  <9.7 (calc) Final  . Non-HDL Cholesterol (Calc) 08/28/2019 85  <130 mg/dL (calc) Final   Comment: For patients with diabetes plus 1 major ASCVD risk  factor, treating to a non-HDL-C goal of <100 mg/dL  (LDL-C of <98 mg/dL) is considered a therapeutic  option.    Past Medical History:  Diagnosis Date  .  Asthma   . Diabetes mellitus without complication (HCC)   . Erectile dysfunction   . GERD (gastroesophageal reflux disease)   . Hyperlipidemia   . Obstructive sleep apnea    compliant with CPAP  . Testosterone deficiency    Past Surgical History:  Procedure Laterality Date  . TONSILLECTOMY     age 75   Current Outpatient Medications on File Prior to Visit  Medication Sig Dispense Refill  . ADVAIR  DISKUS 250-50 MCG/DOSE AEPB INHALE ONE PUFF BY MOUTH TWICE A DAY 60 each 3  . albuterol (PROAIR HFA) 108 (90 Base) MCG/ACT inhaler INHALE ONE PUFF BY MOUTH EVERY FOUR HOURS AS NEEDED 8.5 g 3  . atorvastatin (LIPITOR) 10 MG tablet Take 1 tablet (10 mg total) by mouth daily. 90 tablet 3  . Dulaglutide (TRULICITY) 1.5 RW/4.3XV SOPN Inject 1.5mg   SQ Q week 2 mL 11  . glucose blood (ACCU-CHEK AVIVA PLUS) test strip Use as instructed 100 each 12  . Lancets (ACCU-CHEK MULTICLIX) lancets Use as instructed 100 each 12  . sildenafil (VIAGRA) 100 MG tablet Take 0.5-1 tablets (50-100 mg total) by mouth daily as needed for erectile dysfunction. 5 tablet 11  . UNABLE TO FIND CPAP mask - DX: OSA G47.33 1 Device 0  . testosterone cypionate (DEPOTESTOSTERONE CYPIONATE) 200 MG/ML injection Inject 0.5 mLs (100 mg total) into the muscle once for 1 dose. (Patient taking differently: Inject 100 mg into the muscle once. Once q 3 months) 4 mL 0   No current facility-administered medications on file prior to visit.   No Known Allergies Social History   Socioeconomic History  . Marital status: Married    Spouse name: Not on file  . Number of children: Not on file  . Years of education: Not on file  . Highest education level: Not on file  Occupational History  . Not on file  Tobacco Use  . Smoking status: Never Smoker  . Smokeless tobacco: Never Used  Substance and Sexual Activity  . Alcohol use: Yes    Alcohol/week: 6.0 standard drinks    Types: 1 Standard drinks or equivalent, 5 Shots of liquor per week      Comment: weekend only  . Drug use: No  . Sexual activity: Yes    Comment: Married.    Other Topics Concern  . Not on file  Social History Narrative   Has 3 children. One 55 year, and 53 year old twins. Works as Administrator. Occasional  ETOH.    Social Determinants of Health   Financial Resource Strain:   . Difficulty of Paying Living Expenses:   Food Insecurity:   . Worried About Charity fundraiser in the Last Year:   . Arboriculturist in the Last Year:   Transportation Needs:   . Film/video editor (Medical):   Marland Kitchen Lack of Transportation (Non-Medical):   Physical Activity:   . Days of Exercise per Week:   . Minutes of Exercise per Session:   Stress:   . Feeling of Stress :   Social Connections:   . Frequency of Communication with Friends and Family:   . Frequency of Social Gatherings with Friends and Family:   . Attends Religious Services:   . Active Member of Clubs or Organizations:   . Attends Archivist Meetings:   Marland Kitchen Marital Status:   Intimate Partner Violence:   . Fear of Current or Ex-Partner:   . Emotionally Abused:   Marland Kitchen Physically Abused:   . Sexually Abused:       Review of Systems  All other systems reviewed and are negative.      Objective:   Physical Exam  Constitutional: He is oriented to person, place, and time. He appears well-developed and well-nourished. No distress.  Eyes: Pupils are equal, round, and reactive to light. Conjunctivae and EOM are normal.  Neck: No JVD present. No thyromegaly present.  Cardiovascular: Normal rate, regular rhythm, normal  heart sounds and intact distal pulses. Exam reveals no gallop and no friction rub.  No murmur heard. Pulmonary/Chest: Effort normal and breath sounds normal. No respiratory distress. He has no wheezes. He has no rales.  Abdominal: Soft. Bowel sounds are normal. He exhibits no distension. There is no abdominal tenderness. There is no rebound.  Genitourinary:    Prostate and rectum  normal.   Musculoskeletal:     Cervical back: Neck supple.  Lymphadenopathy:    He has no cervical adenopathy.  Neurological: He is alert and oriented to person, place, and time. He has normal reflexes. No cranial nerve deficit. He exhibits normal muscle tone. Coordination normal.  Skin: He is not diaphoretic.          Assessment & Plan:  Controlled type 2 diabetes mellitus without complication, without long-term current use of insulin (HCC) - Plan: Microalbumin, urine  Dyslipidemia  Prostate cancer screening - Plan: PSA  Encounter for hepatitis C screening test for low risk patient - Plan: Hepatitis C Antibody  Patient is doing outstanding regarding his glycemic control.  Hemoglobin A1c is outstanding at 6.1.  We discussed decreasing his Trulicity however he would like to stay at the current dose since it is working so well.  His blood pressure is outstanding at 116/68.  He is overdue for a urine microalbumin.  I will order that today however he is unable to void this afternoon.  I explained to the patient that he can come by anytime and give [place] a urine sample.  Patient's LDL cholesterol is well below goal of 100.  His HDL cholesterol is low.  I have recommended 30 minutes a day 5 days a week of aerobic exercise to improve.  I will check the patient for prostate cancer with a PSA.  I recommended Pneumovax 23 but the patient politely declined.  I strongly recommended the COVID-19 vaccination but the patient also is hesitant to receive that at the present time.  I will screen for hepatitis C as requested by the patient.

## 2019-09-03 LAB — CBC WITH DIFFERENTIAL/PLATELET
Absolute Monocytes: 436 cells/uL (ref 200–950)
Basophils Absolute: 59 cells/uL (ref 0–200)
Basophils Relative: 0.9 %
Eosinophils Absolute: 429 cells/uL (ref 15–500)
Eosinophils Relative: 6.5 %
HCT: 44.3 % (ref 38.5–50.0)
Hemoglobin: 14.7 g/dL (ref 13.2–17.1)
Lymphs Abs: 1795 cells/uL (ref 850–3900)
MCH: 31.5 pg (ref 27.0–33.0)
MCHC: 33.2 g/dL (ref 32.0–36.0)
MCV: 94.9 fL (ref 80.0–100.0)
MPV: 10 fL (ref 7.5–12.5)
Monocytes Relative: 6.6 %
Neutro Abs: 3881 cells/uL (ref 1500–7800)
Neutrophils Relative %: 58.8 %
Platelets: 179 10*3/uL (ref 140–400)
RBC: 4.67 10*6/uL (ref 4.20–5.80)
RDW: 13.1 % (ref 11.0–15.0)
Total Lymphocyte: 27.2 %
WBC: 6.6 10*3/uL (ref 3.8–10.8)

## 2019-09-03 LAB — LIPID PANEL
Cholesterol: 121 mg/dL (ref <200)
HDL: 36 mg/dL — ABNORMAL LOW (ref >=40)
LDL Cholesterol (Calc): 67 mg/dL (calc)
Non-HDL Cholesterol (Calc): 85 mg/dL (calc) (ref <130)
Total CHOL/HDL Ratio: 3.4 (calc) (ref <5.0)
Triglycerides: 96 mg/dL (ref <150)

## 2019-09-03 LAB — COMPREHENSIVE METABOLIC PANEL
AG Ratio: 2 (calc) (ref 1.0–2.5)
ALT: 21 U/L (ref 9–46)
AST: 18 U/L (ref 10–35)
Albumin: 4.1 g/dL (ref 3.6–5.1)
Alkaline phosphatase (APISO): 70 U/L (ref 35–144)
BUN: 10 mg/dL (ref 7–25)
CO2: 30 mmol/L (ref 20–32)
Calcium: 9.2 mg/dL (ref 8.6–10.3)
Chloride: 106 mmol/L (ref 98–110)
Creat: 0.99 mg/dL (ref 0.70–1.33)
Globulin: 2.1 g/dL (calc) (ref 1.9–3.7)
Glucose, Bld: 128 mg/dL — ABNORMAL HIGH (ref 65–99)
Potassium: 4.6 mmol/L (ref 3.5–5.3)
Sodium: 142 mmol/L (ref 135–146)
Total Bilirubin: 0.4 mg/dL (ref 0.2–1.2)
Total Protein: 6.2 g/dL (ref 6.1–8.1)

## 2019-09-03 LAB — TEST AUTHORIZATION

## 2019-09-03 LAB — HEPATITIS C ANTIBODY
Hepatitis C Ab: NONREACTIVE
SIGNAL TO CUT-OFF: 0.02 (ref <1.00)

## 2019-09-03 LAB — HEMOGLOBIN A1C
Hgb A1c MFr Bld: 6.1 % of total Hgb — ABNORMAL HIGH (ref <5.7)
Mean Plasma Glucose: 128 (calc)
eAG (mmol/L): 7.1 (calc)

## 2019-09-03 LAB — PSA: PSA: 0.6 ng/mL (ref <=4.0)

## 2019-09-21 ENCOUNTER — Other Ambulatory Visit: Payer: Self-pay | Admitting: Family Medicine

## 2019-09-21 NOTE — Telephone Encounter (Signed)
Ok to refill??  Last office visit/ refill 08/30/2019.

## 2019-10-09 ENCOUNTER — Other Ambulatory Visit: Payer: Self-pay

## 2019-10-09 MED ORDER — TRULICITY 1.5 MG/0.5ML ~~LOC~~ SOAJ: SUBCUTANEOUS | 11 refills | Status: DC

## 2019-10-16 ENCOUNTER — Other Ambulatory Visit: Payer: Self-pay | Admitting: Family Medicine

## 2019-10-16 NOTE — Telephone Encounter (Signed)
Ok to refill??  Last office visit 08/30/2019.  Last refill 09/21/2019, #2 refills.

## 2019-11-07 ENCOUNTER — Other Ambulatory Visit: Payer: Self-pay | Admitting: Family Medicine

## 2019-11-08 ENCOUNTER — Telehealth: Payer: Self-pay | Admitting: Family Medicine

## 2019-11-08 NOTE — Telephone Encounter (Signed)
CB# [phone] Refill Tramadol

## 2019-11-09 ENCOUNTER — Other Ambulatory Visit: Payer: Self-pay

## 2019-11-09 MED ORDER — TRAMADOL HCL 50 MG PO TABS: 50.0000 mg | ORAL_TABLET | Freq: Four times a day (QID) | ORAL | 2 refills | Status: DC | PRN

## 2019-11-09 NOTE — Telephone Encounter (Signed)
Requested Prescriptions   Pending Prescriptions Disp Refills  . traMADol (ULTRAM) 50 MG tablet 30 tablet 2    Sig: Take 1 tablet (50 mg total) by mouth every 6 (six) hours as needed. for pain    Last OV 08/30/2019   Last ordered 10/16/2019

## 2019-11-13 NOTE — Telephone Encounter (Signed)
Pt rx was refilled

## 2019-11-29 ENCOUNTER — Other Ambulatory Visit: Payer: Self-pay | Admitting: Family Medicine

## 2019-11-29 NOTE — Telephone Encounter (Signed)
Last OV 08/30/19 Last refill 11/09/19

## 2019-12-12 ENCOUNTER — Other Ambulatory Visit: Payer: Self-pay | Admitting: Family Medicine

## 2019-12-13 ENCOUNTER — Other Ambulatory Visit: Payer: Self-pay | Admitting: Family Medicine

## 2019-12-20 ENCOUNTER — Other Ambulatory Visit: Payer: Self-pay | Admitting: Family Medicine

## 2019-12-20 NOTE — Telephone Encounter (Signed)
Ok to refill??  Last office visit 08/30/2019.  Last refill 11/29/2019.

## 2020-01-07 ENCOUNTER — Other Ambulatory Visit: Payer: Self-pay | Admitting: Family Medicine

## 2020-01-10 ENCOUNTER — Other Ambulatory Visit: Payer: Self-pay | Admitting: Family Medicine

## 2020-01-10 NOTE — Telephone Encounter (Signed)
Ok to refill??  Last office visit 08/30/2019.  Last refill 12/20/2019, #2 refills.

## 2020-01-18 ENCOUNTER — Ambulatory Visit: Payer: BC Managed Care – PPO | Admitting: Family Medicine

## 2020-01-18 ENCOUNTER — Other Ambulatory Visit: Payer: Self-pay

## 2020-01-18 VITALS — BP 106/70 | HR 72 | Temp 98.4°F | Ht 73.0 in | Wt 231.0 lb

## 2020-01-18 DIAGNOSIS — R42 Dizziness and giddiness: Secondary | ICD-10-CM

## 2020-01-18 DIAGNOSIS — E119 Type 2 diabetes mellitus without complications: Secondary | ICD-10-CM

## 2020-01-18 DIAGNOSIS — R399 Unspecified symptoms and signs involving the genitourinary system: Secondary | ICD-10-CM | POA: Diagnosis not present

## 2020-01-18 MED ORDER — TRULICITY 0.75 MG/0.5ML ~~LOC~~ SOAJ: 0.7500 mg | SUBCUTANEOUS | 3 refills | Status: DC

## 2020-01-18 MED ORDER — TAMSULOSIN HCL 0.4 MG PO CAPS: 0.4000 mg | ORAL_CAPSULE | Freq: Every day | ORAL | 3 refills | Status: DC

## 2020-01-18 NOTE — Progress Notes (Signed)
Subjective:    Patient ID: Kyle Campbell, male    DOB: 11-20-1960, 59 y.o.   MRN: [id number]  Patient presents today with 3 concerns.  #1 he is having a difficult time urinating.  He states that he will wake up 2 or 3 times every evening and have to go the bathroom and pee.  He denies dysuria or hematuria or urgency.  He does report weak stream and hesitancy.  This is been going on for several months.  He denies any dysuria specifically.  His PSA in April was 0.6.  He declines a digital rectal exam today.  Second he is having vertigo.  He states for the last 2 weeks, if he turns his head rapidly or sits down rapidly or stands up rapidly the room will start to spin.  He denies any syncope or near syncope or palpitations.  #3 he reports nausea every day on Trulicity.  In April, his A1c was 6.1.  This was on 1.5 mg a week.  He would like to try a lower dose Past Medical History:  Diagnosis Date  . Asthma   . Diabetes mellitus without complication (HCC)   . Erectile dysfunction   . GERD (gastroesophageal reflux disease)   . Hyperlipidemia   . Obstructive sleep apnea    compliant with CPAP  . Testosterone deficiency    Past Surgical History:  Procedure Laterality Date  . TONSILLECTOMY     age 16   Current Outpatient Medications on File Prior to Visit  Medication Sig Dispense Refill  . ADVAIR DISKUS 250-50 MCG/DOSE AEPB INHALE ONE PUFF BY MOUTH TWICE A DAY 60 each 3  . albuterol (PROAIR HFA) 108 (90 Base) MCG/ACT inhaler INHALE 1 PUFF BY MOUTH EVERY FOUR HOURS AS NEEDED 8.5 g 3  . atorvastatin (LIPITOR) 10 MG tablet TAKE 1 TABLET BY MOUTH DAILY. 90 tablet 3  . glucose blood (ACCU-CHEK AVIVA PLUS) test strip Use as instructed 100 each 12  . Lancets (ACCU-CHEK MULTICLIX) lancets Use as instructed 100 each 12  . sildenafil (VIAGRA) 100 MG tablet Take 0.5-1 tablets (50-100 mg total) by mouth daily as needed for erectile dysfunction. 5 tablet 11  . traMADol (ULTRAM) 50 MG tablet TAKE 1 TABLET BY  MOUTH EVERY 6 HOURS AS NEEDED FOR PAIN 30 tablet 2  . UNABLE TO FIND CPAP mask - DX: OSA G47.33 1 Device 0  . testosterone cypionate (DEPOTESTOSTERONE CYPIONATE) 200 MG/ML injection Inject 0.5 mLs (100 mg total) into the muscle once for 1 dose. (Patient taking differently: Inject 100 mg into the muscle once. Once q 3 months) 4 mL 0   No current facility-administered medications on file prior to visit.   No Known Allergies Social History   Socioeconomic History  . Marital status: Married    Spouse name: Not on file  . Number of children: Not on file  . Years of education: Not on file  . Highest education level: Not on file  Occupational History  . Not on file  Tobacco Use  . Smoking status: Never Smoker  . Smokeless tobacco: Never Used  Substance and Sexual Activity  . Alcohol use: Yes    Alcohol/week: 6.0 standard drinks    Types: 1 Standard drinks or equivalent, 5 Shots of liquor per week    Comment: weekend only  . Drug use: No  . Sexual activity: Yes    Comment: Married.    Other Topics Concern  . Not on file  Social History Narrative  Has 3 children. One 86 year, and 4 year old twins. 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    Social Determinants of Health   Financial Resource Strain:   . Difficulty of Paying Living Expenses: Not on file  Food Insecurity:   . Worried About Programme researcher, broadcasting/film/video in the Last Year: Not on file  . Ran Out of Food in the Last Year: Not on file  Transportation Needs:   . Lack of Transportation (Medical): Not on file  . Lack of Transportation (Non-Medical): Not on file  Physical Activity:   . Days of Exercise per Week: Not on file  . Minutes of Exercise per Session: Not on file  Stress:   . Feeling of Stress : Not on file  Social Connections:   . Frequency of Communication with Friends and Family: Not on file  . Frequency of Social Gatherings with Friends and Family: Not on file  . Attends Religious Services: Not on file  . Active  Member of Clubs or Organizations: Not on file  . Attends Banker Meetings: Not on file  . Marital Status: Not on file  Intimate Partner Violence:   . Fear of Current or Ex-Partner: Not on file  . Emotionally Abused: Not on file  . Physically Abused: Not on file  . Sexually Abused: Not on file      Review of Systems  Neurological: Positive for dizziness.  All other systems reviewed and are negative.      Objective:   Physical Exam Constitutional:      General: He is not in acute distress.    Appearance: He is well-developed. He is not diaphoretic.  Eyes:     Conjunctiva/sclera: Conjunctivae normal.     Pupils: Pupils are equal, round, and reactive to light.  Neck:     Thyroid: No thyromegaly.     Vascular: No JVD.  Cardiovascular:     Rate and Rhythm: Normal rate and regular rhythm.     Heart sounds: Normal heart sounds. No murmur heard.  No friction rub. No gallop.   Pulmonary:     Effort: Pulmonary effort is normal. No respiratory distress.     Breath sounds: Normal breath sounds. No wheezing or rales.  Abdominal:     General: Bowel sounds are normal. There is no distension.     Palpations: Abdomen is soft.     Tenderness: There is no abdominal tenderness. There is no rebound.  Genitourinary:    Prostate: Normal.     Rectum: Normal.  Musculoskeletal:     Cervical back: Neck supple.  Lymphadenopathy:     Cervical: No cervical adenopathy.  Neurological:     Mental Status: He is alert and oriented to person, place, and time.     Cranial Nerves: No cranial nerve deficit.     Motor: No abnormal muscle tone.     Coordination: Coordination normal.     Deep Tendon Reflexes: Reflexes are normal and symmetric.           Assessment & Plan:  Vertigo  Lower urinary tract symptoms (LUTS)  Controlled type 2 diabetes mellitus without complication, without long-term current use of insulin (HCC)  Try Flomax 0.4 mg p.o. nightly for lower urinary tract  symptoms.  Discussed the natural history of vertigo and explained the mechanism of action.  Anticipate symptoms to improve over the next 1 to 2 weeks.  Decrease Trulicity to 0.75 mg weekly and recheck in 3 months

## 2020-02-06 ENCOUNTER — Other Ambulatory Visit: Payer: Self-pay | Admitting: Family Medicine

## 2020-02-06 NOTE — Telephone Encounter (Signed)
Ok to refill??  Last office visit 01/18/2020.  Last refill 01/10/2020.

## 2020-02-15 ENCOUNTER — Other Ambulatory Visit: Payer: Self-pay | Admitting: Family Medicine

## 2020-02-15 MED ORDER — TRULICITY 0.75 MG/0.5ML ~~LOC~~ SOAJ: 0.7500 mg | SUBCUTANEOUS | 3 refills | Status: DC

## 2020-02-27 ENCOUNTER — Other Ambulatory Visit: Payer: Self-pay | Admitting: Family Medicine

## 2020-02-28 ENCOUNTER — Telehealth: Payer: Self-pay

## 2020-02-28 ENCOUNTER — Telehealth: Payer: Self-pay | Admitting: Family Medicine

## 2020-02-28 NOTE — Telephone Encounter (Signed)
Patient requesting a refill on his traMADol (ULTRAM) 50 MG tablet  Sig: TAKE 1 TABLET BY MOUTH EVERY 6 HOURS AS NEEDED FOR PAIN [facility] [address]

## 2020-02-29 ENCOUNTER — Other Ambulatory Visit: Payer: Self-pay | Admitting: Family Medicine

## 2020-02-29 MED ORDER — TRAMADOL HCL 50 MG PO TABS
50.0000 mg | ORAL_TABLET | Freq: Four times a day (QID) | ORAL | 2 refills | Status: DC | PRN
Start: 2020-02-29 — End: 2020-03-28

## 2020-02-29 NOTE — Telephone Encounter (Signed)
Refill Tramadol    [id number] - [address], [place] - [phone]

## 2020-03-28 ENCOUNTER — Other Ambulatory Visit: Payer: Self-pay | Admitting: Family Medicine

## 2020-03-28 NOTE — Telephone Encounter (Signed)
Ok to refill??  Last office visit 01/18/2020.  Last refill 02/29/2020

## 2020-04-11 ENCOUNTER — Other Ambulatory Visit: Payer: Self-pay | Admitting: Family Medicine

## 2020-04-18 ENCOUNTER — Other Ambulatory Visit: Payer: Self-pay

## 2020-04-18 ENCOUNTER — Other Ambulatory Visit: Payer: BC Managed Care – PPO

## 2020-04-18 DIAGNOSIS — E119 Type 2 diabetes mellitus without complications: Secondary | ICD-10-CM

## 2020-04-18 DIAGNOSIS — E291 Testicular hypofunction: Secondary | ICD-10-CM

## 2020-04-18 DIAGNOSIS — N3281 Overactive bladder: Secondary | ICD-10-CM

## 2020-04-18 DIAGNOSIS — Z125 Encounter for screening for malignant neoplasm of prostate: Secondary | ICD-10-CM

## 2020-04-18 DIAGNOSIS — E349 Endocrine disorder, unspecified: Secondary | ICD-10-CM

## 2020-04-19 LAB — HEMOGLOBIN A1C
Hgb A1c MFr Bld: 7 % of total Hgb — ABNORMAL HIGH (ref <5.7)
Mean Plasma Glucose: 154 mg/dL
eAG (mmol/L): 8.5 mmol/L

## 2020-04-19 LAB — COMPLETE METABOLIC PANEL WITH GFR
AG Ratio: 1.8 (calc) (ref 1.0–2.5)
ALT: 25 U/L (ref 9–46)
AST: 16 U/L (ref 10–35)
Albumin: 4.2 g/dL (ref 3.6–5.1)
Alkaline phosphatase (APISO): 80 U/L (ref 35–144)
BUN: 10 mg/dL (ref 7–25)
CO2: 28 mmol/L (ref 20–32)
Calcium: 9.1 mg/dL (ref 8.6–10.3)
Chloride: 105 mmol/L (ref 98–110)
Creat: 1 mg/dL (ref 0.70–1.33)
GFR, Est African American: 96 mL/min/{1.73_m2} (ref >=60)
GFR, Est Non African American: 83 mL/min/{1.73_m2} (ref >=60)
Globulin: 2.3 g/dL (calc) (ref 1.9–3.7)
Glucose, Bld: 139 mg/dL — ABNORMAL HIGH (ref 65–99)
Potassium: 4.6 mmol/L (ref 3.5–5.3)
Sodium: 140 mmol/L (ref 135–146)
Total Bilirubin: 0.4 mg/dL (ref 0.2–1.2)
Total Protein: 6.5 g/dL (ref 6.1–8.1)

## 2020-04-19 LAB — LIPID PANEL
Cholesterol: 106 mg/dL (ref <200)
HDL: 39 mg/dL — ABNORMAL LOW (ref >=40)
LDL Cholesterol (Calc): 50 mg/dL (calc)
Non-HDL Cholesterol (Calc): 67 mg/dL (calc) (ref <130)
Total CHOL/HDL Ratio: 2.7 (calc) (ref <5.0)
Triglycerides: 91 mg/dL (ref <150)

## 2020-04-19 LAB — CBC WITH DIFFERENTIAL/PLATELET
Absolute Monocytes: 439 cells/uL (ref 200–950)
Basophils Absolute: 72 cells/uL (ref 0–200)
Basophils Relative: 1 %
Eosinophils Absolute: 425 cells/uL (ref 15–500)
Eosinophils Relative: 5.9 %
HCT: 42.1 % (ref 38.5–50.0)
Hemoglobin: 14.2 g/dL (ref 13.2–17.1)
Lymphs Abs: 2232 cells/uL (ref 850–3900)
MCH: 31.6 pg (ref 27.0–33.0)
MCHC: 33.7 g/dL (ref 32.0–36.0)
MCV: 93.6 fL (ref 80.0–100.0)
MPV: 10.1 fL (ref 7.5–12.5)
Monocytes Relative: 6.1 %
Neutro Abs: 4032 cells/uL (ref 1500–7800)
Neutrophils Relative %: 56 %
Platelets: 204 10*3/uL (ref 140–400)
RBC: 4.5 10*6/uL (ref 4.20–5.80)
RDW: 12.9 % (ref 11.0–15.0)
Total Lymphocyte: 31 %
WBC: 7.2 10*3/uL (ref 3.8–10.8)

## 2020-04-19 LAB — MICROALBUMIN / CREATININE URINE RATIO
Creatinine, Urine: 106 mg/dL (ref 20–320)
Microalb Creat Ratio: 2 mcg/mg creat (ref <30)
Microalb, Ur: 0.2 mg/dL

## 2020-04-22 ENCOUNTER — Ambulatory Visit: Payer: BC Managed Care – PPO | Admitting: Family Medicine

## 2020-04-22 ENCOUNTER — Other Ambulatory Visit: Payer: Self-pay

## 2020-04-22 VITALS — BP 130/80 | HR 75 | Temp 97.9°F | Ht 73.0 in | Wt 230.0 lb

## 2020-04-22 DIAGNOSIS — E119 Type 2 diabetes mellitus without complications: Secondary | ICD-10-CM | POA: Diagnosis not present

## 2020-04-22 DIAGNOSIS — E785 Hyperlipidemia, unspecified: Secondary | ICD-10-CM

## 2020-04-22 MED ORDER — SILDENAFIL CITRATE 100 MG PO TABS: 50.0000 mg | ORAL_TABLET | Freq: Every day | ORAL | 11 refills | Status: DC | PRN

## 2020-04-22 NOTE — Progress Notes (Signed)
Subjective:    Patient ID: Kyle Campbell, male    DOB: Aug 21, 1960, 59 y.o.   MRN: [id number]  Since this summer, the patient has been on reduced dose of Trulicity. He is taking 0.75 mg weekly. His A1c has recently risen from 6.1-7. He denies any polyuria, polydipsia, blurry vision. He denies any chest pain shortness of breath or dyspnea on exertion. He still taking Lipitor 10 mg a day. His most recent lab work shows an excellent fasting lipid panel with an LDL cholesterol well below 100. His urine microalbumin to creatinine ratio is less than 30. He denies any neuropathy in his feet. He denies any burning in his feet. He denies any numbness in his feet. Past Medical History:  Diagnosis Date  . Asthma   . Diabetes mellitus without complication (HCC)   . Erectile dysfunction   . GERD (gastroesophageal reflux disease)   . Hyperlipidemia   . Obstructive sleep apnea    compliant with CPAP  . Testosterone deficiency    Past Surgical History:  Procedure Laterality Date  . TONSILLECTOMY     age 54   Current Outpatient Medications on File Prior to Visit  Medication Sig Dispense Refill  . ADVAIR DISKUS 250-50 MCG/DOSE AEPB INHALE ONE PUFF BY MOUTH TWICE A DAY 60 each 3  . albuterol (PROAIR HFA) 108 (90 Base) MCG/ACT inhaler INHALE 1 PUFF BY MOUTH EVERY FOUR HOURS AS NEEDED 8.5 g 3  . atorvastatin (LIPITOR) 10 MG tablet TAKE 1 TABLET BY MOUTH DAILY. 90 tablet 3  . Dulaglutide (TRULICITY) 0.75 MG/0.5ML SOPN Inject 0.75 mg into the skin once a week. 4 mL 3  . glucose blood (ACCU-CHEK AVIVA PLUS) test strip Use as instructed 100 each 12  . Lancets (ACCU-CHEK MULTICLIX) lancets Use as instructed 100 each 12  . tamsulosin (FLOMAX) 0.4 MG CAPS capsule TAKE 1 CAPSULE BY MOUTH EVERY DAY 90 capsule 1  . traMADol (ULTRAM) 50 MG tablet TAKE 1 TABLET BY MOUTH EVERY 6 HOURS AS NEEDED. FOR PAIN 30 tablet 2  . UNABLE TO FIND CPAP mask - DX: OSA G47.33 1 Device 0   No current facility-administered  medications on file prior to visit.   No Known Allergies Social History   Socioeconomic History  . Marital status: Married    Spouse name: Not on file  . Number of children: Not on file  . Years of education: Not on file  . Highest education level: Not on file  Occupational History  . Not on file  Tobacco Use  . Smoking status: Never Smoker  . Smokeless tobacco: Never Used  Substance and Sexual Activity  . Alcohol use: Yes    Alcohol/week: 6.0 standard drinks    Types: 1 Standard drinks or equivalent, 5 Shots of liquor per week    Comment: weekend only  . Drug use: No  . Sexual activity: Yes    Comment: Married.    Other Topics Concern  . Not on file  Social History Narrative   Has 3 children. One 32 year, and 40 year old twins. 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    Social Determinants of Health   Financial Resource Strain: Not on file  Food Insecurity: Not on file  Transportation Needs: Not on file  Physical Activity: Not on file  Stress: Not on file  Social Connections: Not on file  Intimate Partner Violence: Not on file      Review of Systems  All other systems reviewed and  are negative.      Objective:   Physical Exam Constitutional:      General: He is not in acute distress.    Appearance: He is well-developed. He is not diaphoretic.  Eyes:     Conjunctiva/sclera: Conjunctivae normal.     Pupils: Pupils are equal, round, and reactive to light.  Neck:     Thyroid: No thyromegaly.     Vascular: No JVD.  Cardiovascular:     Rate and Rhythm: Normal rate and regular rhythm.     Heart sounds: Normal heart sounds. No murmur heard. No friction rub. No gallop.   Pulmonary:     Effort: Pulmonary effort is normal. No respiratory distress.     Breath sounds: Normal breath sounds. No wheezing or rales.  Abdominal:     General: Bowel sounds are normal. There is no distension.     Palpations: Abdomen is soft.     Tenderness: There is no abdominal  tenderness. There is no rebound.  Genitourinary:    Prostate: Normal.     Rectum: Normal.  Musculoskeletal:     Cervical back: Neck supple.  Lymphadenopathy:     Cervical: No cervical adenopathy.  Neurological:     Mental Status: He is alert and oriented to person, place, and time.     Cranial Nerves: No cranial nerve deficit.     Motor: No abnormal muscle tone.     Coordination: Coordination normal.     Deep Tendon Reflexes: Reflexes are normal and symmetric.           Assessment & Plan:  Dyslipidemia  Controlled type 2 diabetes mellitus without complication, without long-term current use of insulin (HCC)  We discussed increasing Trulicity or resuming Metformin but the patient would like to try to lower his A1c by losing the 8 pounds that he has gained since the summer and working on his diet. His blood pressure today is excellent. His cholesterol is acceptable. His urine microalbumin to creatinine ratio is within normal range. Therefore we'll recheck his lab work in 6 months.

## 2020-04-29 ENCOUNTER — Other Ambulatory Visit: Payer: Self-pay | Admitting: Family Medicine

## 2020-04-29 NOTE — Telephone Encounter (Signed)
Ok to refill??  Last office visit 04/22/2020.  Last refill 03/28/2020.

## 2020-05-27 ENCOUNTER — Other Ambulatory Visit: Payer: Self-pay | Admitting: Family Medicine

## 2020-05-30 ENCOUNTER — Encounter: Payer: Self-pay | Admitting: Nurse Practitioner

## 2020-05-30 ENCOUNTER — Telehealth (INDEPENDENT_AMBULATORY_CARE_PROVIDER_SITE_OTHER): Payer: BC Managed Care – PPO | Admitting: Nurse Practitioner

## 2020-05-30 ENCOUNTER — Other Ambulatory Visit: Payer: Self-pay

## 2020-05-30 ENCOUNTER — Other Ambulatory Visit: Payer: Self-pay | Admitting: Nurse Practitioner

## 2020-05-30 DIAGNOSIS — U071 COVID-19: Secondary | ICD-10-CM

## 2020-05-30 MED ORDER — GUAIFENESIN ER 600 MG PO TB12: 600.0000 mg | ORAL_TABLET | Freq: Two times a day (BID) | ORAL | 0 refills | Status: DC | PRN

## 2020-05-30 MED ORDER — ALBUTEROL SULFATE (2.5 MG/3ML) 0.083% IN NEBU: 2.5000 mg | INHALATION_SOLUTION | Freq: Four times a day (QID) | RESPIRATORY_TRACT | 1 refills | Status: DC | PRN

## 2020-05-30 NOTE — Assessment & Plan Note (Signed)
Positive test 05/30/2020.

## 2020-05-30 NOTE — Progress Notes (Signed)
Subjective:    Patient ID: Kyle Campbell, male    DOB: [birth year], 61 y.o.   MRN: [id number]  HPI: Kyle Campbell is a 60 y.o. male presenting virtually for chest congestion.  Chief Complaint  Patient presents with  . URI    COVID-19 test positive 05/30/2020   UPPER RESPIRATORY TRACT INFECTION Tested positive for COVID-19 this morning.  Cleans CPAP regularly. Onset: ~2 weeks in the morning Worst symptom: congestion in the morning Fever: no Cough: yes; just in the morning Shortness of breath: no Wheezing: no Chest pain: no Chest tightness: yes, only in the morning and resolved after albuterol Chest congestion: yes Nasal congestion: yes; just in the morning Runny nose: no Post nasal drip: no Sneezing: no Sore throat: no Swollen glands: no Sinus pressure: no Headache: no Face pain: no Toothache: no Ear pain: no  Ear pressure: no  Eyes red/itching:no Eye drainage/crusting: no  Nausea: no  Vomiting: no Diarrhea: no  Change in appetite: no  Loss of taste/smell: no  Rash: no Fatigue: yes  Sick contacts: yes; wife Strep contacts: no  Context: better Recurrent sinusitis: no Treatments attempted: Alka Seltzer day time, Afrin, albuterol inhaler Relief with OTC medications: yes  No Known Allergies  Outpatient Encounter Medications as of 05/30/2020  Medication Sig  . albuterol (PROVENTIL) (2.5 MG/3ML) 0.083% nebulizer solution Take 3 mLs (2.5 mg total) by nebulization every 6 (six) hours as needed for wheezing or shortness of breath.  . guaiFENesin (MUCINEX) 600 MG 12 hr tablet Take 1 tablet (600 mg total) by mouth 2 (two) times daily as needed for cough or to loosen phlegm.  Marland Kitchen ADVAIR DISKUS 250-50 MCG/DOSE AEPB INHALE ONE PUFF BY MOUTH TWICE A DAY  . albuterol (PROAIR HFA) 108 (90 Base) MCG/ACT inhaler INHALE 1 PUFF BY MOUTH EVERY FOUR HOURS AS NEEDED  . atorvastatin (LIPITOR) 10 MG tablet TAKE 1 TABLET BY MOUTH DAILY.  . Dulaglutide (TRULICITY) 0.75 MG/0.5ML SOPN  Inject 0.75 mg into the skin once a week.  Marland Kitchen glucose blood (ACCU-CHEK AVIVA PLUS) test strip Use as instructed  . Lancets (ACCU-CHEK MULTICLIX) lancets Use as instructed  . sildenafil (VIAGRA) 100 MG tablet Take 0.5-1 tablets (50-100 mg total) by mouth daily as needed for erectile dysfunction.  . tamsulosin (FLOMAX) 0.4 MG CAPS capsule TAKE 1 CAPSULE BY MOUTH EVERY DAY  . traMADol (ULTRAM) 50 MG tablet TAKE 1 TABLET BY MOUTH EVERY 6 HOURS AS NEEDED FOR PAIN  . UNABLE TO FIND CPAP mask - DX: OSA G47.33   No facility-administered encounter medications on file as of 05/30/2020.    Patient Active Problem List   Diagnosis Date Noted  . COVID-19 05/30/2020  . GERD (gastroesophageal reflux disease) 03/07/2016  . Esophageal foreign body 03/07/2016  . OAB (overactive bladder) 01/13/2016  . Internal hemorrhoid 10/08/2014  . Diabetes mellitus without complication (HCC)   . Testosterone deficiency   . HYPERLIPIDEMIA-MIXED 12/18/2008  . SHORTNESS OF BREATH 12/18/2008    Past Medical History:  Diagnosis Date  . Asthma   . Diabetes mellitus without complication (HCC)   . Erectile dysfunction   . GERD (gastroesophageal reflux disease)   . Hyperlipidemia   . Obstructive sleep apnea    compliant with CPAP  . Testosterone deficiency     Relevant past medical, surgical, family and social history reviewed and updated as indicated. Interim medical history since our last visit reviewed.  Review of Systems Per HPI unless specifically indicated above     Objective:  SpO2 94%   Wt Readings from Last 3 Encounters:  04/22/20 230 lb (104.3 kg)  01/18/20 231 lb (104.8 kg)  08/30/19 222 lb (100.7 kg)    Physical Exam Vitals and nursing note reviewed.  Constitutional:      General: He is not in acute distress.    Appearance: Normal appearance. He is not ill-appearing or toxic-appearing.  HENT:     Head: Normocephalic and atraumatic.     Right Ear: External ear normal.     Left Ear:  External ear normal.     Nose: Nose normal. No congestion or rhinorrhea.     Mouth/Throat:     Mouth: Mucous membranes are moist.     Pharynx: Oropharynx is clear. No posterior oropharyngeal erythema.  Eyes:     General: No scleral icterus.       Right eye: No discharge.        Left eye: No discharge.     Extraocular Movements: Extraocular movements intact.  Cardiovascular:     Comments: Unable to assess heart sounds via virtual visit Pulmonary:     Effort: Pulmonary effort is normal. No respiratory distress.     Comments: Unable to assess lung sounds via virtual visit.  Patient talking in complete sentences during telemedicine visit; no accessory muscle use. Abdominal:     Comments: Unable to assess bowel sounds via virtual visit  Musculoskeletal:     Cervical back: Normal range of motion.  Skin:    Coloration: Skin is not jaundiced or pale.     Findings: No erythema.  Neurological:     General: No focal deficit present.     Mental Status: He is alert and oriented to person, place, and time.     Motor: No weakness.  Psychiatric:        Mood and Affect: Mood normal.        Behavior: Behavior normal.        Thought Content: Thought content normal.        Judgment: Judgment normal.       Assessment & Plan:  1. COVID-19 Acute, ongoing.  Positive test 05/30/2020.  Symptoms for over 2 weeks, but worsened this morning.  Encouraged isolation 10 days from today per CDC guidelines.  Reassured patient that symptoms and exam findings are most consistent with a viral upper respiratory infection and explained lack of efficacy of antibiotics against viruses.  Discussed expected course and features suggestive of secondary bacterial infection.  Continue supportive care. Increase fluid intake with water or electrolyte solution like pedialyte. Encouraged acetaminophen as needed for fever/pain. Encouraged salt water gargling, chloraseptic spray and throat lozenges. Encouraged OTC guaifenesin.  Encouraged saline sinus flushes and/or neti with humidified air. Albuterol as needed for wheezing/shortness of breath.  Albuterol nebulizer refilled.  Follow up in 1 week if symptoms not improving.  With any sudden onset new chest pain, dizziness, sweating, or shortness of breath, go to ED.  - guaiFENesin (MUCINEX) 600 MG 12 hr tablet; Take 1 tablet (600 mg total) by mouth 2 (two) times daily as needed for cough or to loosen phlegm.  Dispense: 30 tablet; Refill: 0 - albuterol (PROVENTIL) (2.5 MG/3ML) 0.083% nebulizer solution; Take 3 mLs (2.5 mg total) by nebulization every 6 (six) hours as needed for wheezing or shortness of breath.  Dispense: 150 mL; Refill: 1   Follow up plan: Return if symptoms worsen or fail to improve.  Due to the catastrophic nature of the COVID-19 pandemic, this visit was completed  via audio and visual contact via [facility] due to the restrictions of the COVID-19 pandemic. All issues as above were discussed and addressed. Physical exam was done as above through visual confirmation on [facility]. If it was felt that the patient should be evaluated in the office, they were directed there. The patient verbally consented to this visit. Video visit transitioned to phone visit mid way through visit due to patient's home internet connection difficulties. . Location of the patient: home . Location of the provider: work . Those involved with this call:  . Provider: Mardene Celeste, DNP . CMA: none . Front Desk/Registration: Flavia Shipper  . Time spent on call: 25 minutes with patient face to face via video conference. More than 50% of this time was spent in counseling and coordination of care. 20 minutes total spent in review of patient's record and preparation of their chart.  I verified patient identity using two factors (patient name and date of birth). Patient consents verbally to being seen via telemedicine visit today.

## 2020-05-30 NOTE — Patient Instructions (Signed)

## 2020-06-23 ENCOUNTER — Other Ambulatory Visit: Payer: Self-pay | Admitting: Family Medicine

## 2020-07-03 ENCOUNTER — Other Ambulatory Visit: Payer: Self-pay | Admitting: Family Medicine

## 2020-07-04 ENCOUNTER — Other Ambulatory Visit: Payer: Self-pay | Admitting: Family Medicine

## 2020-07-21 ENCOUNTER — Other Ambulatory Visit: Payer: Self-pay | Admitting: Family Medicine

## 2020-08-04 ENCOUNTER — Encounter: Payer: Self-pay | Admitting: Family Medicine

## 2020-08-04 ENCOUNTER — Ambulatory Visit (INDEPENDENT_AMBULATORY_CARE_PROVIDER_SITE_OTHER): Payer: BC Managed Care – PPO | Admitting: Family Medicine

## 2020-08-04 ENCOUNTER — Other Ambulatory Visit: Payer: Self-pay | Admitting: Family Medicine

## 2020-08-04 ENCOUNTER — Other Ambulatory Visit: Payer: Self-pay

## 2020-08-04 VITALS — BP 128/78 | HR 64 | Temp 98.4°F | Ht 73.0 in | Wt 227.0 lb

## 2020-08-04 DIAGNOSIS — J4541 Moderate persistent asthma with (acute) exacerbation: Secondary | ICD-10-CM | POA: Diagnosis not present

## 2020-08-04 DIAGNOSIS — Z8616 Personal history of COVID-19: Secondary | ICD-10-CM

## 2020-08-04 DIAGNOSIS — U071 COVID-19: Secondary | ICD-10-CM

## 2020-08-04 MED ORDER — PREDNISONE 20 MG PO TABS: 60.0000 mg | ORAL_TABLET | Freq: Every day | ORAL | 0 refills | Status: DC

## 2020-08-04 MED ORDER — METHYLPREDNISOLONE ACETATE 40 MG/ML IJ SUSP
60.0000 mg | Freq: Once | INTRAMUSCULAR | Status: AC
  Administered 2020-08-04: 60 mg via INTRAMUSCULAR

## 2020-08-04 MED ORDER — ALBUTEROL SULFATE (2.5 MG/3ML) 0.083% IN NEBU: 2.5000 mg | INHALATION_SOLUTION | Freq: Four times a day (QID) | RESPIRATORY_TRACT | 1 refills | Status: DC | PRN

## 2020-08-05 NOTE — Progress Notes (Signed)
Subjective:    Patient ID: Kyle Campbell, male    DOB: 01/29/1961, 60 y.o.   MRN: [id number] Patient presents today complaining of shortness of breath.  Kyle Campbell has a history of asthma.  Kyle Campbell is currently on Advair 250/50 1 inhalation a day.  Normally his asthma is well controlled and Kyle Campbell has not had a severe exacerbation in quite some time.  Kyle Campbell seldom has to use his albuterol however over the last week Kyle Campbell has had progressing shortness of breath.  Is have not used his albuterol for 5 times a day in order to breathe.  Kyle Campbell reports a nonproductive cough.  Kyle Campbell reports tightness in his chest.  Kyle Campbell denies any hemoptysis.  Kyle Campbell denies any fevers or chills.  Kyle Campbell does report some rhinorrhea.  Kyle Campbell had COVID in January and Kyle Campbell states that his shortness of breath has been present ever since then but has drastically worsened over the last week.  Kyle Campbell reports audible wheezing.  Kyle Campbell denies any angina.  Kyle Campbell denies any orthopnea or paroxysmal nocturnal dyspnea.  Kyle Campbell denies any leg swelling or rapid weight gain Past Medical History:  Diagnosis Date  . Asthma   . Diabetes mellitus without complication (HCC)   . Erectile dysfunction   . GERD (gastroesophageal reflux disease)   . Hyperlipidemia   . Obstructive sleep apnea    compliant with CPAP  . Testosterone deficiency    Past Surgical History:  Procedure Laterality Date  . TONSILLECTOMY     age 89   Current Outpatient Medications on File Prior to Visit  Medication Sig Dispense Refill  . ADVAIR DISKUS 250-50 MCG/DOSE AEPB INHALE ONE PUFF BY MOUTH TWICE A DAY 60 each 3  . albuterol (PROAIR HFA) 108 (90 Base) MCG/ACT inhaler INHALE 1 PUFF BY MOUTH EVERY FOUR HOURS AS NEEDED 8.5 g 3  . atorvastatin (LIPITOR) 10 MG tablet TAKE 1 TABLET BY MOUTH DAILY. 90 tablet 3  . Dulaglutide (TRULICITY) 0.75 MG/0.5ML SOPN Inject 0.75 mg into the skin once a week. 4 mL 3  . glucose blood (ACCU-CHEK AVIVA PLUS) test strip Use as instructed 100 each 12  . guaiFENesin (MUCINEX) 600 MG 12 hr tablet  Take 1 tablet (600 mg total) by mouth 2 (two) times daily as needed for cough or to loosen phlegm. 30 tablet 0  . Lancets (ACCU-CHEK MULTICLIX) lancets Use as instructed 100 each 12  . sildenafil (VIAGRA) 100 MG tablet Take 0.5-1 tablets (50-100 mg total) by mouth daily as needed for erectile dysfunction. 5 tablet 11  . tamsulosin (FLOMAX) 0.4 MG CAPS capsule TAKE 1 CAPSULE BY MOUTH EVERY DAY 90 capsule 1  . traMADol (ULTRAM) 50 MG tablet TAKE 1 TABLET BY MOUTH EVERY 6 HOURS AS NEEDED FOR PAIN 30 tablet 0  . UNABLE TO FIND CPAP mask - DX: OSA G47.33 1 Device 0   No current facility-administered medications on file prior to visit.   No Known Allergies Social History   Socioeconomic History  . Marital status: Married    Spouse name: Not on file  . Number of children: Not on file  . Years of education: Not on file  . Highest education level: Not on file  Occupational History  . Not on file  Tobacco Use  . Smoking status: Never Smoker  . Smokeless tobacco: Never Used  Substance and Sexual Activity  . Alcohol use: Yes    Alcohol/week: 6.0 standard drinks    Types: 1 Standard drinks or equivalent, 5 Shots of liquor  per week    Comment: weekend only  . Drug use: No  . Sexual activity: Yes    Comment: Married.    Other Topics Concern  . Not on file  Social History Narrative   Has 3 children. One 39 year, and 70 year old twins. 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    Social Determinants of Health   Financial Resource Strain: Not on file  Food Insecurity: Not on file  Transportation Needs: Not on file  Physical Activity: Not on file  Stress: Not on file  Social Connections: Not on file  Intimate Partner Violence: Not on file      Review of Systems  All other systems reviewed and are negative.      Objective:   Physical Exam Constitutional:      General: Kyle Campbell is not in acute distress.    Appearance: Kyle Campbell is well-developed. Kyle Campbell is not diaphoretic.  Eyes:      Conjunctiva/sclera: Conjunctivae normal.     Pupils: Pupils are equal, round, and reactive to light.  Neck:     Thyroid: No thyromegaly.     Vascular: No JVD.  Cardiovascular:     Rate and Rhythm: Normal rate and regular rhythm.     Heart sounds: Normal heart sounds. No murmur heard. No friction rub. No gallop.   Pulmonary:     Effort: Pulmonary effort is normal. No respiratory distress.     Breath sounds: Decreased air movement present. Examination of the right-upper field reveals decreased breath sounds. Examination of the left-upper field reveals decreased breath sounds. Examination of the right-middle field reveals decreased breath sounds. Examination of the left-middle field reveals decreased breath sounds. Examination of the right-lower field reveals decreased breath sounds. Examination of the left-lower field reveals decreased breath sounds. Decreased breath sounds and wheezing present. No rhonchi or rales.  Abdominal:     General: Bowel sounds are normal. There is no distension.     Palpations: Abdomen is soft.     Tenderness: There is no abdominal tenderness. There is no rebound.  Genitourinary:    Prostate: Normal.     Rectum: Normal.  Musculoskeletal:     Cervical back: Neck supple.  Lymphadenopathy:     Cervical: No cervical adenopathy.  Neurological:     Mental Status: Kyle Campbell is alert and oriented to person, place, and time.     Cranial Nerves: No cranial nerve deficit.     Motor: No abnormal muscle tone.     Coordination: Coordination normal.     Deep Tendon Reflexes: Reflexes are normal and symmetric.           Assessment & Plan:  Moderate persistent asthma with exacerbation - Plan: methylPREDNISolone acetate (DEPO-MEDROL) injection 60 mg  COVID-19 - Plan: albuterol (PROVENTIL) (2.5 MG/3ML) 0.083% nebulizer solution  Patient appears to be having an asthma exacerbation.  Begin Depo-Medrol 60 mg IM x1 and then start prednisone 60 mg a day for 5 days thereafter.  Use  albuterol 2.5 mg nebs every 4-6 hours as needed for wheezing.  Reassess in 24 hours if no better or immediately if worsening.

## 2020-08-14 ENCOUNTER — Other Ambulatory Visit: Payer: Self-pay | Admitting: Family Medicine

## 2020-08-14 MED ORDER — TRAMADOL HCL 50 MG PO TABS: 50.0000 mg | ORAL_TABLET | Freq: Four times a day (QID) | ORAL | 0 refills | Status: DC | PRN

## 2020-08-14 NOTE — Telephone Encounter (Signed)
Ok to refill??  Last office visit 08/04/2020.  Last refill 07/21/2020.

## 2020-08-14 NOTE — Telephone Encounter (Signed)
Patient called to follow up on refill request for   traMADol Janean Sark) 50 MG tablet [[id number]]   Pharmacy:   [id number] [place], [place] - [address]  [address] [place] [address]  Phone:  [phone] Fax:  [phone]   Please advise at [phone].

## 2020-09-05 ENCOUNTER — Other Ambulatory Visit: Payer: Self-pay | Admitting: Family Medicine

## 2020-09-05 NOTE — Telephone Encounter (Signed)
Ok to refill??  Last office visit 08/04/2020.  Last refill 08/14/2020.

## 2020-09-19 ENCOUNTER — Encounter: Payer: Self-pay | Admitting: Family Medicine

## 2020-09-19 ENCOUNTER — Other Ambulatory Visit: Payer: Self-pay

## 2020-09-19 ENCOUNTER — Ambulatory Visit: Payer: BC Managed Care – PPO | Admitting: Family Medicine

## 2020-09-19 VITALS — BP 124/78 | HR 92 | Temp 98.8°F | Resp 16 | Ht 73.0 in | Wt 233.0 lb

## 2020-09-19 DIAGNOSIS — R5382 Chronic fatigue, unspecified: Secondary | ICD-10-CM | POA: Diagnosis not present

## 2020-09-19 MED ORDER — TRAMADOL HCL 50 MG PO TABS
50.0000 mg | ORAL_TABLET | Freq: Four times a day (QID) | ORAL | 0 refills | Status: DC | PRN
  Filled 2020-09-19: qty 90, 23d supply, fill #0

## 2020-09-19 NOTE — Progress Notes (Signed)
Subjective:    Patient ID: Kyle Campbell, male    DOB: [birth year], 60 y.o.   MRN: [id number]  Patient had COVID in January.  He states ever since January he just has no energy.  He denies any chest pain.  He denies any dyspnea on exertion.  He denies any orthopnea.  He denies any shortness of breath out of proportion to before.  He reports profound fatigue.  He just feels tired.  He reports decreasing energy and decreasing muscle strength.  He denies any weight loss.  He denies any nausea or vomiting abdominal pain.  He denies any melena or hematochezia.  He denies any fevers chills or night sweats.  He does have a history of hypergonadism and is no longer on treatment  Past Medical History:  Diagnosis Date  . Asthma   . Diabetes mellitus without complication (HCC)   . Erectile dysfunction   . GERD (gastroesophageal reflux disease)   . Hyperlipidemia   . Obstructive sleep apnea    compliant with CPAP  . Testosterone deficiency    Past Surgical History:  Procedure Laterality Date  . TONSILLECTOMY     age 58   Current Outpatient Medications on File Prior to Visit  Medication Sig Dispense Refill  . albuterol (PROVENTIL) (2.5 MG/3ML) 0.083% nebulizer solution USE ONE VIAL VIA NEBULIZER EVERY 6 HOURS AS NEEDED FOR WHEEZING OR SHORTNESS OF BREATH. 150 mL 1  . albuterol (VENTOLIN HFA) 108 (90 Base) MCG/ACT inhaler INHALE 1 PUFF BY MOUTH EVERY FOUR HOURS AS NEEDED 18 g 3  . atorvastatin (LIPITOR) 10 MG tablet TAKE 1 TABLET BY MOUTH DAILY. 90 tablet 3  . Fluticasone-Salmeterol (ADVAIR) 250-50 MCG/DOSE AEPB INHALE ONE PUFF BY MOUTH TWICE A DAY 60 each 3  . glucose blood (ACCU-CHEK AVIVA PLUS) test strip Use as instructed 100 each 12  . guaiFENesin (MUCINEX) 600 MG 12 hr tablet Take 1 tablet (600 mg total) by mouth 2 (two) times daily as needed for cough or to loosen phlegm. 30 tablet 0  . Lancets (ACCU-CHEK MULTICLIX) lancets Use as instructed 100 each 12  . sildenafil (VIAGRA) 100 MG tablet  Take 0.5-1 tablets (50-100 mg total) by mouth daily as needed for erectile dysfunction. 5 tablet 11  . TRULICITY 1.5 MG/0.5ML SOPN Inject 1.5 mg into the skin once a week.    Marland Kitchen UNABLE TO FIND CPAP mask - DX: OSA G47.33 1 Device 0   No current facility-administered medications on file prior to visit.   No Known Allergies Social History   Socioeconomic History  . Marital status: Married    Spouse name: Not on file  . Number of children: Not on file  . Years of education: Not on file  . Highest education level: Not on file  Occupational History  . Not on file  Tobacco Use  . Smoking status: Never Smoker  . Smokeless tobacco: Never Used  Substance and Sexual Activity  . Alcohol use: Yes    Alcohol/week: 6.0 standard drinks    Types: 1 Standard drinks or equivalent, 5 Shots of liquor per week    Comment: weekend only  . Drug use: No  . Sexual activity: Yes    Comment: Married.    Other Topics Concern  . Not on file  Social History Narrative   Has 3 children. One 33 year, and 74 year old twins. 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    Social Determinants of Health   Financial Resource Strain: Not  on file  Food Insecurity: Not on file  Transportation Needs: Not on file  Physical Activity: Not on file  Stress: Not on file  Social Connections: Not on file  Intimate Partner Violence: Not on file      Review of Systems  All other systems reviewed and are negative.      Objective:   Physical Exam Constitutional:      General: He is not in acute distress.    Appearance: He is well-developed. He is not diaphoretic.  Eyes:     Conjunctiva/sclera: Conjunctivae normal.     Pupils: Pupils are equal, round, and reactive to light.  Neck:     Thyroid: No thyromegaly.     Vascular: No JVD.  Cardiovascular:     Rate and Rhythm: Normal rate and regular rhythm.     Heart sounds: Normal heart sounds. No murmur heard. No friction rub. No gallop.   Pulmonary:     Effort:  Pulmonary effort is normal. No respiratory distress.     Breath sounds: Normal breath sounds. No wheezing or rales.  Abdominal:     General: Bowel sounds are normal. There is no distension.     Palpations: Abdomen is soft.     Tenderness: There is no abdominal tenderness. There is no rebound.  Genitourinary:    Prostate: Normal.     Rectum: Normal.  Musculoskeletal:     Cervical back: Neck supple.  Lymphadenopathy:     Cervical: No cervical adenopathy.  Neurological:     Mental Status: He is alert and oriented to person, place, and time.     Cranial Nerves: No cranial nerve deficit.     Motor: No abnormal muscle tone.     Coordination: Coordination normal.     Deep Tendon Reflexes: Reflexes are normal and symmetric.           Assessment & Plan:  Chronic fatigue - Plan: CBC with Differential/Platelet, COMPLETE METABOLIC PANEL WITH GFR, Vitamin B12, TSH, Sedimentation rate, Testosterone Total,Free,Bio, Males His exam today is reassuring.  I recommended a lab work-up for possible causes of fatigue including a CBC, CMP, B12, TSH, testosterone level, and a sedimentation rate.  Await the results of lab work however I suspect hypogonadism is most likely to blame.  Patient previously benefited from taking testosterone and would be willing to reconsider resuming it now if it would help his fatigue

## 2020-09-23 ENCOUNTER — Other Ambulatory Visit: Payer: Self-pay

## 2020-09-24 LAB — COMPLETE METABOLIC PANEL WITH GFR
AG Ratio: 1.9 (calc) (ref 1.0–2.5)
ALT: 26 U/L (ref 9–46)
AST: 16 U/L (ref 10–35)
Albumin: 4.4 g/dL (ref 3.6–5.1)
Alkaline phosphatase (APISO): 99 U/L (ref 35–144)
BUN: 11 mg/dL (ref 7–25)
CO2: 30 mmol/L (ref 20–32)
Calcium: 9.5 mg/dL (ref 8.6–10.3)
Chloride: 98 mmol/L (ref 98–110)
Creat: 1.11 mg/dL (ref 0.70–1.33)
GFR, Est African American: 84 mL/min/{1.73_m2} (ref >=60)
GFR, Est Non African American: 73 mL/min/{1.73_m2} (ref >=60)
Globulin: 2.3 g/dL (calc) (ref 1.9–3.7)
Glucose, Bld: 256 mg/dL — ABNORMAL HIGH (ref 65–99)
Potassium: 4.5 mmol/L (ref 3.5–5.3)
Sodium: 136 mmol/L (ref 135–146)
Total Bilirubin: 0.4 mg/dL (ref 0.2–1.2)
Total Protein: 6.7 g/dL (ref 6.1–8.1)

## 2020-09-24 LAB — CBC WITH DIFFERENTIAL/PLATELET
Absolute Monocytes: 561 cells/uL (ref 200–950)
Basophils Absolute: 92 cells/uL (ref 0–200)
Basophils Relative: 1 %
Eosinophils Absolute: 442 cells/uL (ref 15–500)
Eosinophils Relative: 4.8 %
HCT: 44.5 % (ref 38.5–50.0)
Hemoglobin: 15.3 g/dL (ref 13.2–17.1)
Lymphs Abs: 3045 cells/uL (ref 850–3900)
MCH: 31.7 pg (ref 27.0–33.0)
MCHC: 34.4 g/dL (ref 32.0–36.0)
MCV: 92.3 fL (ref 80.0–100.0)
MPV: 10.4 fL (ref 7.5–12.5)
Monocytes Relative: 6.1 %
Neutro Abs: 5060 cells/uL (ref 1500–7800)
Neutrophils Relative %: 55 %
Platelets: 188 10*3/uL (ref 140–400)
RBC: 4.82 10*6/uL (ref 4.20–5.80)
RDW: 13 % (ref 11.0–15.0)
Total Lymphocyte: 33.1 %
WBC: 9.2 10*3/uL (ref 3.8–10.8)

## 2020-09-24 LAB — TESTOSTERONE TOTAL,FREE,BIO, MALES
Albumin: 4.4 g/dL (ref 3.6–5.1)
Sex Hormone Binding: 18 nmol/L — ABNORMAL LOW (ref 22–77)
Testosterone: 209 ng/dL — ABNORMAL LOW (ref 250–827)

## 2020-09-24 LAB — SEDIMENTATION RATE: Sed Rate: 2 mm/h (ref 0–20)

## 2020-09-24 LAB — HEMOGLOBIN A1C W/OUT EAG: Hgb A1c MFr Bld: 11 % of total Hgb — ABNORMAL HIGH (ref <5.7)

## 2020-09-24 LAB — TEST AUTHORIZATION

## 2020-09-24 LAB — VITAMIN B12: Vitamin B-12: 548 pg/mL (ref 200–1100)

## 2020-09-24 LAB — TSH: TSH: 1.33 mIU/L (ref 0.40–4.50)

## 2020-09-26 ENCOUNTER — Other Ambulatory Visit: Payer: Self-pay

## 2020-09-29 ENCOUNTER — Other Ambulatory Visit: Payer: Self-pay

## 2020-09-29 MED ORDER — FLUTICASONE-SALMETEROL 250-50 MCG/ACT IN AEPB
1.0000 | INHALATION_SPRAY | Freq: Two times a day (BID) | RESPIRATORY_TRACT | 0 refills | Status: DC
  Filled 2020-09-29: qty 60, 30d supply, fill #0

## 2020-09-30 ENCOUNTER — Encounter: Payer: Self-pay | Admitting: Family Medicine

## 2020-09-30 ENCOUNTER — Ambulatory Visit: Payer: BC Managed Care – PPO | Admitting: Family Medicine

## 2020-09-30 ENCOUNTER — Other Ambulatory Visit: Payer: Self-pay

## 2020-09-30 VITALS — BP 122/72 | HR 92 | Temp 98.7°F | Resp 16 | Ht 73.0 in | Wt 219.0 lb

## 2020-09-30 DIAGNOSIS — E1165 Type 2 diabetes mellitus with hyperglycemia: Secondary | ICD-10-CM

## 2020-09-30 MED ORDER — PIOGLITAZONE HCL 30 MG PO TABS
30.0000 mg | ORAL_TABLET | Freq: Every day | ORAL | 3 refills | Status: DC
  Filled 2020-09-30: qty 30, 30d supply, fill #0
  Filled 2020-10-24: qty 30, 30d supply, fill #1
  Filled 2020-12-05: qty 30, 30d supply, fill #2
  Filled 2021-01-16: qty 30, 30d supply, fill #3

## 2020-09-30 MED ORDER — FLUTICASONE-SALMETEROL 250-50 MCG/ACT IN AEPB
1.0000 | INHALATION_SPRAY | Freq: Two times a day (BID) | RESPIRATORY_TRACT | 0 refills | Status: DC
  Filled 2020-12-01 (×2): qty 60, 30d supply, fill #0

## 2020-09-30 MED ORDER — METFORMIN HCL 500 MG PO TABS
1000.0000 mg | ORAL_TABLET | Freq: Two times a day (BID) | ORAL | 3 refills | Status: DC
  Filled 2020-09-30: qty 120, 30d supply, fill #0
  Filled 2020-11-19: qty 120, 30d supply, fill #1
  Filled 2021-01-01: qty 120, 30d supply, fill #2
  Filled 2021-02-26: qty 120, 30d supply, fill #3

## 2020-09-30 NOTE — Progress Notes (Signed)
Subjective:    Patient ID: Kyle Campbell, male    DOB: Oct 29, 1960, 60 y.o.   MRN: [id number] 09/19/20 Patient had COVID in January.  He states ever since January he just has no energy.  He denies any chest pain.  He denies any dyspnea on exertion.  He denies any orthopnea.  He denies any shortness of breath out of proportion to before.  He reports profound fatigue.  He just feels tired.  He reports decreasing energy and decreasing muscle strength.  He denies any weight loss.  He denies any nausea or vomiting abdominal pain.  He denies any melena or hematochezia.  He denies any fevers chills or night sweats.  He does have a history of hypergonadism and is no longer on treatment.  At that time, my plan was: His exam today is reassuring.  I recommended a lab work-up for possible causes of fatigue including a CBC, CMP, B12, TSH, testosterone level, and a sedimentation rate.  Await the results of lab work however I suspect hypogonadism is most likely to blame.  Patient previously benefited from taking testosterone and would be willing to reconsider resuming it now if it would help his fatigue  Office Visit on 09/19/2020  Component Date Value Ref Range Status  . WBC 09/19/2020 9.2  3.8 - 10.8 Thousand/uL Final  . RBC 09/19/2020 4.82  4.20 - 5.80 Million/uL Final  . Hemoglobin 09/19/2020 15.3  13.2 - 17.1 g/dL Final  . HCT 97/06/6376 44.5  38.5 - 50.0 % Final  . MCV 09/19/2020 92.3  80.0 - 100.0 fL Final  . MCH 09/19/2020 31.7  27.0 - 33.0 pg Final  . MCHC 09/19/2020 34.4  32.0 - 36.0 g/dL Final  . RDW 58/85/0277 13.0  11.0 - 15.0 % Final  . Platelets 09/19/2020 188  140 - 400 Thousand/uL Final  . MPV 09/19/2020 10.4  7.5 - 12.5 fL Final  . Neutro Abs 09/19/2020 5,060  1,500 - 7,800 cells/uL Final  . Lymphs Abs 09/19/2020 3,045  850 - 3,900 cells/uL Final  . Absolute Monocytes 09/19/2020 561  200 - 950 cells/uL Final  . Eosinophils Absolute 09/19/2020 442  15 - 500 cells/uL Final  . Basophils  Absolute 09/19/2020 92  0 - 200 cells/uL Final  . Neutrophils Relative % 09/19/2020 55  % Final  . Total Lymphocyte 09/19/2020 33.1  % Final  . Monocytes Relative 09/19/2020 6.1  % Final  . Eosinophils Relative 09/19/2020 4.8  % Final  . Basophils Relative 09/19/2020 1.0  % Final  . Glucose, Bld 09/19/2020 256* 65 - 99 mg/dL Final   Comment: .            Fasting reference interval . For someone without known diabetes, a glucose value >125 mg/dL indicates that they may have diabetes and this should be confirmed with a follow-up test. .   . BUN 09/19/2020 11  7 - 25 mg/dL Final  . Creat 41/28/7867 1.11  0.70 - 1.33 mg/dL Final   Comment: For patients >57 years of age, the reference limit for Creatinine is approximately 13% higher for people identified as African-American. .   . GFR, Est Non African American 09/19/2020 73  > OR = 60 mL/min/1.55m2 Final  . GFR, Est African American 09/19/2020 84  > OR = 60 mL/min/1.70m2 Final  . BUN/Creatinine Ratio 09/19/2020 NOT APPLICABLE  6 - 22 (calc) Final  . Sodium 09/19/2020 136  135 - 146 mmol/L Final  . Potassium 09/19/2020 4.5  3.5 - 5.3 mmol/L Final  . Chloride 09/19/2020 98  98 - 110 mmol/L Final  . CO2 09/19/2020 30  20 - 32 mmol/L Final  . Calcium 09/19/2020 9.5  8.6 - 10.3 mg/dL Final  . Total Protein 09/19/2020 6.7  6.1 - 8.1 g/dL Final  . Albumin 72/53/6644 4.4  3.6 - 5.1 g/dL Final  . Globulin 03/47/4259 2.3  1.9 - 3.7 g/dL (calc) Final  . AG Ratio 09/19/2020 1.9  1.0 - 2.5 (calc) Final  . Total Bilirubin 09/19/2020 0.4  0.2 - 1.2 mg/dL Final  . Alkaline phosphatase (APISO) 09/19/2020 99  35 - 144 U/L Final  . AST 09/19/2020 16  10 - 35 U/L Final  . ALT 09/19/2020 26  9 - 46 U/L Final  . Vitamin B-12 09/19/2020 548  200 - 1,100 pg/mL Final  . TSH 09/19/2020 1.33  0.40 - 4.50 mIU/L Final  . Sed Rate 09/19/2020 2  0 - 20 mm/h Final  . Testosterone 09/19/2020 209* 250 - 827 ng/dL Final  . Albumin 56/38/7564 4.4  3.6 - 5.1 g/dL  Final  . Sex Hormone Binding 09/19/2020 18* 22 - 77 nmol/L Final  . Testosterone, Free 09/19/2020 See below  46.0 - 224.0 pg/mL Final  . Testosterone, Bioavailable 09/19/2020   110.0 - 575.0 ng/dL Final   Comment: Due to the diminished accuracy of immunoassay at levels below 250 ng/dL, calculations of the Free and Bioavailable Testosterone are not accurate. If needed, Testosterone, Free, Bio and Total, LC/MS/MS (test code 33295) is the recommended assay. This specimen  must be collected in a red-top tube with no gel. .   . Hgb A1c MFr Bld 09/19/2020 11.0* <5.7 % of total Hgb Final   Comment: For someone without known diabetes, a hemoglobin A1c value of 6.5% or greater indicates that they may have  diabetes and this should be confirmed with a follow-up  test. . For someone with known diabetes, a value <7% indicates  that their diabetes is well controlled and a value  greater than or equal to 7% indicates suboptimal  control. A1c targets should be individualized based on  duration of diabetes, age, comorbid conditions, and  other considerations. . Currently, no consensus exists regarding use of hemoglobin A1c for diagnosis of diabetes for children. .   . TEST NAME: 09/19/2020 HEMOGLOBIN A1c   Final  . TEST CODE: 09/19/2020 [id number]   Final  . CLIENT CONTACT: 09/19/2020 Dwana Curd   Final  . REPORT ALWAYS MESSAGE SIGNATURE 09/19/2020    Final   Comment: . The laboratory testing on this patient was verbally requested or confirmed by the ordering physician or his or her authorized representative after contact with an employee of [employer]. Federal regulations require that we maintain on file written authorization for all laboratory testing.  Accordingly we are asking that the ordering physician or his or her authorized representative sign a copy of this report and promptly return it to the client service  representative. . . Signature:____________________________________________________ . Please fax this signed page to [phone] or return it via your [employer] courier.    09/30/20 As shown on the lab work above, his hemoglobin A1c indicates that his diabetes is out of control.  His testosterone was also extremely low.  He is here today to discuss both issues.  He has been taking Trulicity 1.5 mg subcu weekly.  He denies any change in his diet or his exercise.  He was on prednisone with COVID however that  was quite sometime ago his breathing has returned to normal.  He denies any polyuria, polydipsia or blurry vision.  He denies any hypoglycemic episodes.  Past Medical History:  Diagnosis Date  . Asthma   . Diabetes mellitus without complication (HCC)   . Erectile dysfunction   . GERD (gastroesophageal reflux disease)   . Hyperlipidemia   . Obstructive sleep apnea    compliant with CPAP  . Testosterone deficiency    Past Surgical History:  Procedure Laterality Date  . TONSILLECTOMY     age 28   Current Outpatient Medications on File Prior to Visit  Medication Sig Dispense Refill  . albuterol (PROVENTIL) (2.5 MG/3ML) 0.083% nebulizer solution USE ONE VIAL VIA NEBULIZER EVERY 6 HOURS AS NEEDED FOR WHEEZING OR SHORTNESS OF BREATH. 150 mL 1  . albuterol (VENTOLIN HFA) 108 (90 Base) MCG/ACT inhaler INHALE 1 PUFF BY MOUTH EVERY FOUR HOURS AS NEEDED 18 g 3  . atorvastatin (LIPITOR) 10 MG tablet TAKE 1 TABLET BY MOUTH DAILY. 90 tablet 3  . Fluticasone-Salmeterol (ADVAIR) 250-50 MCG/DOSE AEPB INHALE ONE PUFF BY MOUTH TWICE A DAY 60 each 3  . glucose blood (ACCU-CHEK AVIVA PLUS) test strip Use as instructed 100 each 12  . guaiFENesin (MUCINEX) 600 MG 12 hr tablet Take 1 tablet (600 mg total) by mouth 2 (two) times daily as needed for cough or to loosen phlegm. 30 tablet 0  . Lancets (ACCU-CHEK MULTICLIX) lancets Use as instructed 100 each 12  . sildenafil (VIAGRA) 100 MG tablet Take  0.5-1 tablets (50-100 mg total) by mouth daily as needed for erectile dysfunction. 5 tablet 11  . traMADol (ULTRAM) 50 MG tablet Take 1 tablet (50 mg total) by mouth every 6 (six) hours as needed. for pain 90 tablet 0  . TRULICITY 1.5 MG/0.5ML SOPN Inject 1.5 mg into the skin once a week.    Marland Kitchen. UNABLE TO FIND CPAP mask - DX: OSA G47.33 1 Device 0   No current facility-administered medications on file prior to visit.   No Known Allergies Social History   Socioeconomic History  . Marital status: Married    Spouse name: Not on file  . Number of children: Not on file  . Years of education: Not on file  . Highest education level: Not on file  Occupational History  . Not on file  Tobacco Use  . Smoking status: Never Smoker  . Smokeless tobacco: Never Used  Substance and Sexual Activity  . Alcohol use: Yes    Alcohol/week: 6.0 standard drinks    Types: 1 Standard drinks or equivalent, 5 Shots of liquor per week    Comment: weekend only  . Drug use: No  . Sexual activity: Yes    Comment: Married.    Other Topics Concern  . Not on file  Social History Narrative   Has 3 children. One 7220 year, and 58 year old twins. 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    Social Determinants of Health   Financial Resource Strain: Not on file  Food Insecurity: Not on file  Transportation Needs: Not on file  Physical Activity: Not on file  Stress: Not on file  Social Connections: Not on file  Intimate Partner Violence: Not on file      Review of Systems  All other systems reviewed and are negative.      Objective:   Physical Exam Constitutional:      General: He is not in acute distress.    Appearance: He  is well-developed. He is not diaphoretic.  Eyes:     Conjunctiva/sclera: Conjunctivae normal.     Pupils: Pupils are equal, round, and reactive to light.  Neck:     Thyroid: No thyromegaly.     Vascular: No JVD.  Cardiovascular:     Rate and Rhythm: Normal rate and regular  rhythm.     Heart sounds: Normal heart sounds. No murmur heard. No friction rub. No gallop.   Pulmonary:     Effort: Pulmonary effort is normal. No respiratory distress.     Breath sounds: Normal breath sounds. No wheezing or rales.  Abdominal:     General: Bowel sounds are normal. There is no distension.     Palpations: Abdomen is soft.     Tenderness: There is no abdominal tenderness. There is no rebound.  Genitourinary:    Prostate: Normal.     Rectum: Normal.  Musculoskeletal:     Cervical back: Neck supple.  Lymphadenopathy:     Cervical: No cervical adenopathy.  Neurological:     Mental Status: He is alert and oriented to person, place, and time.     Cranial Nerves: No cranial nerve deficit.     Motor: No abnormal muscle tone.     Coordination: Coordination normal.     Deep Tendon Reflexes: Reflexes are normal and symmetric.           Assessment & Plan:  Uncontrolled type 2 diabetes mellitus with hyperglycemia (HCC)  We will start the patient on metformin 1000 mg twice daily.  He will wean himself up to that dose over the next 1 to 2 weeks.  We will also start Actos 30 mg a day and continue Trulicity.  Recheck fasting blood sugar and 2-hour postprandial sugars in 1 month.  If sugars are not under 130 fasting in the morning and under 182 hours after meals, we may need to add additional medication at that time.  We have deferred treating hypogonadism at the present time until his sugars have improved but he can resume testosterone replacement in the future if his fatigue persist despite correcting his hyperglycemia

## 2020-10-01 ENCOUNTER — Other Ambulatory Visit: Payer: Self-pay

## 2020-10-14 ENCOUNTER — Other Ambulatory Visit: Payer: Self-pay

## 2020-10-15 ENCOUNTER — Other Ambulatory Visit: Payer: Self-pay

## 2020-10-21 ENCOUNTER — Other Ambulatory Visit: Payer: Self-pay

## 2020-10-21 ENCOUNTER — Ambulatory Visit: Payer: BC Managed Care – PPO | Admitting: Family Medicine

## 2020-10-21 VITALS — BP 120/74 | HR 72 | Temp 97.9°F | Ht 73.0 in | Wt 224.0 lb

## 2020-10-21 DIAGNOSIS — E1165 Type 2 diabetes mellitus with hyperglycemia: Secondary | ICD-10-CM

## 2020-10-21 DIAGNOSIS — E349 Endocrine disorder, unspecified: Secondary | ICD-10-CM

## 2020-10-21 MED ORDER — INSULIN PEN NEEDLE 29G X 12MM MISC
1.0000 | 3 refills | Status: DC
  Filled 2020-10-21: qty 30, fill #0

## 2020-10-21 MED ORDER — TESTOSTERONE CYPIONATE 200 MG/ML IM SOLN
200.0000 mg | INTRAMUSCULAR | 3 refills | Status: DC
  Filled 2020-10-21 – 2020-11-06 (×3): qty 6, 84d supply, fill #0

## 2020-10-21 NOTE — Progress Notes (Signed)
Subjective:    Patient ID: Kyle Campbell, male    DOB: 1961/04/04, 60 y.o.   MRN: [id number] 09/19/20 Patient had COVID in January.  He states ever since January he just has no energy.  He denies any chest pain.  He denies any dyspnea on exertion.  He denies any orthopnea.  He denies any shortness of breath out of proportion to before.  He reports profound fatigue.  He just feels tired.  He reports decreasing energy and decreasing muscle strength.  He denies any weight loss.  He denies any nausea or vomiting abdominal pain.  He denies any melena or hematochezia.  He denies any fevers chills or night sweats.  He does have a history of hypergonadism and is no longer on treatment.  At that time, my plan was: His exam today is reassuring.  I recommended a lab work-up for possible causes of fatigue including a CBC, CMP, B12, TSH, testosterone level, and a sedimentation rate.  Await the results of lab work however I suspect hypogonadism is most likely to blame.  Patient previously benefited from taking testosterone and would be willing to reconsider resuming it now if it would help his fatigue  No visits with results within 1 Month(s) from this visit.  Latest known visit with results is:  Office Visit on 09/19/2020  Component Date Value Ref Range Status   WBC 09/19/2020 9.2  3.8 - 10.8 Thousand/uL Final   RBC 09/19/2020 4.82  4.20 - 5.80 Million/uL Final   Hemoglobin 09/19/2020 15.3  13.2 - 17.1 g/dL Final   HCT 93/81/8299 44.5  38.5 - 50.0 % Final   MCV 09/19/2020 92.3  80.0 - 100.0 fL Final   MCH 09/19/2020 31.7  27.0 - 33.0 pg Final   MCHC 09/19/2020 34.4  32.0 - 36.0 g/dL Final   RDW 37/16/9678 13.0  11.0 - 15.0 % Final   Platelets 09/19/2020 188  140 - 400 Thousand/uL Final   MPV 09/19/2020 10.4  7.5 - 12.5 fL Final   Neutro Abs 09/19/2020 5,060  1,500 - 7,800 cells/uL Final   Lymphs Abs 09/19/2020 3,045  850 - 3,900 cells/uL Final   Absolute Monocytes 09/19/2020 561  200 - 950 cells/uL  Final   Eosinophils Absolute 09/19/2020 442  15 - 500 cells/uL Final   Basophils Absolute 09/19/2020 92  0 - 200 cells/uL Final   Neutrophils Relative % 09/19/2020 55  % Final   Total Lymphocyte 09/19/2020 33.1  % Final   Monocytes Relative 09/19/2020 6.1  % Final   Eosinophils Relative 09/19/2020 4.8  % Final   Basophils Relative 09/19/2020 1.0  % Final   Glucose, Bld 09/19/2020 256 (A) 65 - 99 mg/dL Final   Comment: .            Fasting reference interval . For someone without known diabetes, a glucose value >125 mg/dL indicates that they may have diabetes and this should be confirmed with a follow-up test. .    BUN 09/19/2020 11  7 - 25 mg/dL Final   Creat 93/81/0175 1.11  0.70 - 1.33 mg/dL Final   Comment: For patients >65 years of age, the reference limit for Creatinine is approximately 13% higher for people identified as African-American. .    GFR, Est Non African American 09/19/2020 73  > OR = 60 mL/min/1.7m2 Final   GFR, Est African American 09/19/2020 84  > OR = 60 mL/min/1.1m2 Final   BUN/Creatinine Ratio 09/19/2020 NOT APPLICABLE  6 - 22 (  calc) Final   Sodium 09/19/2020 136  135 - 146 mmol/L Final   Potassium 09/19/2020 4.5  3.5 - 5.3 mmol/L Final   Chloride 09/19/2020 98  98 - 110 mmol/L Final   CO2 09/19/2020 30  20 - 32 mmol/L Final   Calcium 09/19/2020 9.5  8.6 - 10.3 mg/dL Final   Total Protein 16/10/960405/13/2022 6.7  6.1 - 8.1 g/dL Final   Albumin 54/09/811905/13/2022 4.4  3.6 - 5.1 g/dL Final   Globulin 14/78/295605/13/2022 2.3  1.9 - 3.7 g/dL (calc) Final   AG Ratio 09/19/2020 1.9  1.0 - 2.5 (calc) Final   Total Bilirubin 09/19/2020 0.4  0.2 - 1.2 mg/dL Final   Alkaline phosphatase (APISO) 09/19/2020 99  35 - 144 U/L Final   AST 09/19/2020 16  10 - 35 U/L Final   ALT 09/19/2020 26  9 - 46 U/L Final   Vitamin B-12 09/19/2020 548  200 - 1,100 pg/mL Final   TSH 09/19/2020 1.33  0.40 - 4.50 mIU/L Final   Sed Rate 09/19/2020 2  0 - 20 mm/h Final   Testosterone 09/19/2020 209 (A) 250 -  827 ng/dL Final   Albumin 21/30/865705/13/2022 4.4  3.6 - 5.1 g/dL Final   Sex Hormone Binding 09/19/2020 18 (A) 22 - 77 nmol/L Final   Testosterone, Free 09/19/2020 See below  46.0 - 224.0 pg/mL Final   Testosterone, Bioavailable 09/19/2020   110.0 - 575.0 ng/dL Final   Comment: Due to the diminished accuracy of immunoassay at levels below 250 ng/dL, calculations of the Free and Bioavailable Testosterone are not accurate. If needed, Testosterone, Free, Bio and Total, LC/MS/MS (test code [id number]) is the recommended assay. This specimen  must be collected in a red-top tube with no gel. .    Hgb A1c MFr Bld 09/19/2020 11.0 (A) <5.7 % of total Hgb Final   Comment: For someone without known diabetes, a hemoglobin A1c value of 6.5% or greater indicates that they may have  diabetes and this should be confirmed with a follow-up  test. . For someone with known diabetes, a value <7% indicates  that their diabetes is well controlled and a value  greater than or equal to 7% indicates suboptimal  control. A1c targets should be individualized based on  duration of diabetes, age, comorbid conditions, and  other considerations. . Currently, no consensus exists regarding use of hemoglobin A1c for diagnosis of diabetes for children. .    TEST NAME: 09/19/2020 HEMOGLOBIN A1c   Final   TEST CODE: 09/19/2020 [id number]   Final   CLIENT CONTACT: 09/19/2020 Dwana CurdKIM WILES   Final   REPORT ALWAYS MESSAGE SIGNATURE 09/19/2020    Final   Comment: . The laboratory testing on this patient was verbally requested or confirmed by the ordering physician or his or her authorized representative after contact with an employee of [employer]. Federal regulations require that we maintain on file written authorization for all laboratory testing.  Accordingly we are asking that the ordering physician or his or her authorized representative sign a copy of this report and promptly return it to the client service  representative. . . Signature:____________________________________________________ . Please fax this signed page to [phone] or return it via your [facility] courier.    09/30/20 As shown on the lab work above, his hemoglobin A1c indicates that his diabetes is out of control.  His testosterone was also extremely low.  He is here today to discuss both issues.  He has been taking Trulicity 1.5 mg  subcu weekly.  He denies any change in his diet or his exercise.  He was on prednisone with COVID however that was quite sometime ago his breathing has returned to normal.  He denies any polyuria, polydipsia or blurry vision.  He denies any hypoglycemic episodes.  At that time, my plan was:  We will start the patient on metformin 1000 mg twice daily.  He will wean himself up to that dose over the next 1 to 2 weeks.  We will also start Actos 30 mg a day and continue Trulicity.  Recheck fasting blood sugar and 2-hour postprandial sugars in 1 month.  If sugars are not under 130 fasting in the morning and under 182 hours after meals, we may need to add additional medication at that time.  We have deferred treating hypogonadism at the present time until his sugars have improved but he can resume testosterone replacement in the future if his fatigue persist despite correcting his hyperglycemia  10/21/20 Patient is here today for follow-up.  He has not checked his blood sugar since I last saw him.  He checked it this morning it was 150.  This the only blood sugar he has to report.  Therefore is difficult to determine if the metformin and Actos or sufficient in helping to manage his blood sugar.  He also request to resume testosterone.  He states that he is just feeling extremely weak and tired.  He has very little stamina and very little energy.  He would like to resume the previous dose of testosterone that he was taking which was 200 mg every 2 weeks.  He reports muscle weakness and fatigue and poor libido.   He feels that he benefited from the testosterone in the past  Past Medical History:  Diagnosis Date   Asthma    Diabetes mellitus without complication (HCC)    Erectile dysfunction    GERD (gastroesophageal reflux disease)    Hyperlipidemia    Obstructive sleep apnea    compliant with CPAP   Testosterone deficiency    Past Surgical History:  Procedure Laterality Date   TONSILLECTOMY     age 42   Current Outpatient Medications on File Prior to Visit  Medication Sig Dispense Refill   albuterol (PROVENTIL) (2.5 MG/3ML) 0.083% nebulizer solution USE ONE VIAL VIA NEBULIZER EVERY 6 HOURS AS NEEDED FOR WHEEZING OR SHORTNESS OF BREATH. 150 mL 1   albuterol (VENTOLIN HFA) 108 (90 Base) MCG/ACT inhaler INHALE 1 PUFF BY MOUTH EVERY FOUR HOURS AS NEEDED 18 g 3   atorvastatin (LIPITOR) 10 MG tablet TAKE 1 TABLET BY MOUTH DAILY. 90 tablet 3   fluticasone-salmeterol (ADVAIR) 250-50 MCG/ACT AEPB Inhale 1 puff into the lungs 2 (two) times daily. 60 each 0   Fluticasone-Salmeterol (ADVAIR) 250-50 MCG/DOSE AEPB INHALE ONE PUFF BY MOUTH TWICE A DAY 60 each 3   glucose blood (ACCU-CHEK AVIVA PLUS) test strip Use as instructed 100 each 12   guaiFENesin (MUCINEX) 600 MG 12 hr tablet Take 1 tablet (600 mg total) by mouth 2 (two) times daily as needed for cough or to loosen phlegm. 30 tablet 0   Lancets (ACCU-CHEK MULTICLIX) lancets Use as instructed 100 each 12   metFORMIN (GLUCOPHAGE) 500 MG tablet Take 2 tablets (1,000 mg total) by mouth 2 (two) times daily with a meal. 120 tablet 3   pioglitazone (ACTOS) 30 MG tablet Take 1 tablet (30 mg total) by mouth daily. 30 tablet 3   sildenafil (VIAGRA) 100 MG tablet Take  0.5-1 tablets (50-100 mg total) by mouth daily as needed for erectile dysfunction. 5 tablet 11   traMADol (ULTRAM) 50 MG tablet Take 1 tablet (50 mg total) by mouth every 6 (six) hours as needed. for pain 90 tablet 0   TRULICITY 1.5 MG/0.5ML SOPN Inject 1.5 mg into the skin once a week.      UNABLE TO FIND CPAP mask - DX: OSA G47.33 1 Device 0   No current facility-administered medications on file prior to visit.   No Known Allergies Social History   Socioeconomic History   Marital status: Married    Spouse name: Not on file   Number of children: Not on file   Years of education: Not on file   Highest education level: Not on file  Occupational History   Not on file  Tobacco Use   Smoking status: Never   Smokeless tobacco: Never  Substance and Sexual Activity   Alcohol use: Yes    Alcohol/week: 6.0 standard drinks    Types: 1 Standard drinks or equivalent, 5 Shots of liquor per week    Comment: weekend only   Drug use: No   Sexual activity: Yes    Comment: Married.    Other Topics Concern   Not on file  Social History Narrative   Has 3 children. One 82 year, and 71 year old twins. 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    Social Determinants of Health   Financial Resource Strain: Not on file  Food Insecurity: Not on file  Transportation Needs: Not on file  Physical Activity: Not on file  Stress: Not on file  Social Connections: Not on file  Intimate Partner Violence: Not on file      Review of Systems  All other systems reviewed and are negative.     Objective:   Physical Exam Constitutional:      General: He is not in acute distress.    Appearance: He is well-developed. He is not diaphoretic.  Eyes:     Conjunctiva/sclera: Conjunctivae normal.     Pupils: Pupils are equal, round, and reactive to light.  Neck:     Thyroid: No thyromegaly.     Vascular: No JVD.  Cardiovascular:     Rate and Rhythm: Normal rate and regular rhythm.     Heart sounds: Normal heart sounds. No murmur heard.   No friction rub. No gallop.  Pulmonary:     Effort: Pulmonary effort is normal. No respiratory distress.     Breath sounds: Normal breath sounds. No wheezing or rales.  Abdominal:     General: Bowel sounds are normal. There is no distension.      Palpations: Abdomen is soft.     Tenderness: There is no abdominal tenderness. There is no rebound.  Genitourinary:    Prostate: Normal.     Rectum: Normal.  Musculoskeletal:     Cervical back: Neck supple.  Lymphadenopathy:     Cervical: No cervical adenopathy.  Neurological:     Mental Status: He is alert and oriented to person, place, and time.     Cranial Nerves: No cranial nerve deficit.     Motor: No abnormal muscle tone.     Coordination: Coordination normal.     Deep Tendon Reflexes: Reflexes are normal and symmetric.          Assessment & Plan:  Uncontrolled type 2 diabetes mellitus with hyperglycemia (HCC)  Testosterone deficiency Check fasting blood sugar and 2-hour postprandial sugars over  the next 5 days and report the values to me on Monday.  If fasting blood sugars are consistently greater than 130 and/or 2-hour postprandial sugars are consistently greater than 180, I would recommend adding Jardiance 25 mg a day to his therapy and then rechecking an A1c in 3 months.  Resume testosterone 200 mg IM every 2 weeks and then recheck a CBC and a PSA in 3 months along with a testosterone level.

## 2020-10-22 ENCOUNTER — Telehealth: Payer: Self-pay | Admitting: *Deleted

## 2020-10-22 ENCOUNTER — Other Ambulatory Visit: Payer: Self-pay

## 2020-10-22 NOTE — Telephone Encounter (Signed)
Received request from pharmacy for PA on Testosterone Cypionate.   PA submitted.   Dx: E29.1- testicular hypofunction.   Your information has been submitted to [employer]. To check for an updated outcome later, reopen this PA request from your dashboard.  If [employer] has not responded to your request within 24 hours, contact [employer] at [phone]

## 2020-10-23 ENCOUNTER — Other Ambulatory Visit: Payer: Self-pay

## 2020-10-24 ENCOUNTER — Other Ambulatory Visit: Payer: Self-pay

## 2020-10-24 ENCOUNTER — Other Ambulatory Visit: Payer: Self-pay | Admitting: Family Medicine

## 2020-10-24 MED ORDER — TRAMADOL HCL 50 MG PO TABS
50.0000 mg | ORAL_TABLET | Freq: Four times a day (QID) | ORAL | 0 refills | Status: DC | PRN
  Filled 2020-10-24: qty 90, 23d supply, fill #0

## 2020-10-29 ENCOUNTER — Encounter: Payer: Self-pay | Admitting: *Deleted

## 2020-10-29 NOTE — Telephone Encounter (Signed)
Received PA determination.   PA denied.   Appeal faxed.  

## 2020-11-05 ENCOUNTER — Other Ambulatory Visit: Payer: Self-pay | Admitting: Family Medicine

## 2020-11-06 ENCOUNTER — Other Ambulatory Visit: Payer: Self-pay

## 2020-11-06 MED FILL — Dulaglutide Soln Auto-injector 1.5 MG/0.5ML: SUBCUTANEOUS | 28 days supply | Qty: 2 | Fill #0 | Status: AC

## 2020-11-06 NOTE — Telephone Encounter (Signed)
Received appeal determination.   Appeal denied.  

## 2020-11-11 ENCOUNTER — Other Ambulatory Visit: Payer: Self-pay | Admitting: *Deleted

## 2020-11-11 ENCOUNTER — Other Ambulatory Visit: Payer: Self-pay

## 2020-11-11 DIAGNOSIS — E349 Endocrine disorder, unspecified: Secondary | ICD-10-CM

## 2020-11-14 ENCOUNTER — Other Ambulatory Visit: Payer: Self-pay

## 2020-11-14 ENCOUNTER — Other Ambulatory Visit: Payer: BC Managed Care – PPO

## 2020-11-14 DIAGNOSIS — E349 Endocrine disorder, unspecified: Secondary | ICD-10-CM

## 2020-11-15 LAB — TESTOSTERONE: Testosterone: 371 ng/dL (ref 250–827)

## 2020-11-19 ENCOUNTER — Other Ambulatory Visit: Payer: Self-pay

## 2020-11-25 ENCOUNTER — Other Ambulatory Visit: Payer: Self-pay | Admitting: Family Medicine

## 2020-11-25 ENCOUNTER — Other Ambulatory Visit: Payer: Self-pay

## 2020-11-27 ENCOUNTER — Other Ambulatory Visit: Payer: Self-pay | Admitting: Family Medicine

## 2020-11-28 ENCOUNTER — Other Ambulatory Visit: Payer: Self-pay

## 2020-11-28 MED ORDER — TRAMADOL HCL 50 MG PO TABS
50.0000 mg | ORAL_TABLET | Freq: Four times a day (QID) | ORAL | 0 refills | Status: DC | PRN
  Filled 2020-11-28: qty 90, 23d supply, fill #0

## 2020-12-01 ENCOUNTER — Other Ambulatory Visit: Payer: Self-pay

## 2020-12-02 ENCOUNTER — Other Ambulatory Visit: Payer: Self-pay

## 2020-12-05 ENCOUNTER — Other Ambulatory Visit: Payer: Self-pay

## 2020-12-05 MED FILL — Dulaglutide Soln Auto-injector 1.5 MG/0.5ML: SUBCUTANEOUS | 28 days supply | Qty: 2 | Fill #1 | Status: AC

## 2020-12-10 ENCOUNTER — Other Ambulatory Visit: Payer: Self-pay

## 2020-12-29 ENCOUNTER — Other Ambulatory Visit: Payer: Self-pay

## 2021-01-01 ENCOUNTER — Other Ambulatory Visit: Payer: Self-pay | Admitting: Family Medicine

## 2021-01-01 ENCOUNTER — Other Ambulatory Visit: Payer: Self-pay

## 2021-01-01 MED FILL — Tramadol HCl Tab 50 MG: ORAL | 23 days supply | Qty: 90 | Fill #0 | Status: AC

## 2021-01-01 NOTE — Telephone Encounter (Signed)
Ok to refill??  Last office visit 10/21/2020.  Last refill 11/28/2020.

## 2021-01-01 NOTE — Telephone Encounter (Signed)
Ok to refill??  Last office visit 10/21/2020.  Last refill 11/28/2020. 

## 2021-01-02 ENCOUNTER — Other Ambulatory Visit: Payer: Self-pay

## 2021-01-05 ENCOUNTER — Other Ambulatory Visit: Payer: Self-pay

## 2021-01-05 ENCOUNTER — Other Ambulatory Visit: Payer: BC Managed Care – PPO

## 2021-01-05 DIAGNOSIS — Z125 Encounter for screening for malignant neoplasm of prostate: Secondary | ICD-10-CM

## 2021-01-05 DIAGNOSIS — E1165 Type 2 diabetes mellitus with hyperglycemia: Secondary | ICD-10-CM

## 2021-01-05 DIAGNOSIS — E785 Hyperlipidemia, unspecified: Secondary | ICD-10-CM

## 2021-01-05 MED FILL — Dulaglutide Soln Auto-injector 1.5 MG/0.5ML: SUBCUTANEOUS | 28 days supply | Qty: 2 | Fill #2 | Status: AC

## 2021-01-06 ENCOUNTER — Other Ambulatory Visit: Payer: Self-pay | Admitting: Family Medicine

## 2021-01-06 LAB — LIPID PANEL
Cholesterol: 167 mg/dL (ref <200)
HDL: 38 mg/dL — ABNORMAL LOW (ref >=40)
LDL Cholesterol (Calc): 95 mg/dL (calc)
Non-HDL Cholesterol (Calc): 129 mg/dL (calc) (ref <130)
Total CHOL/HDL Ratio: 4.4 (calc) (ref <5.0)
Triglycerides: 258 mg/dL — ABNORMAL HIGH (ref <150)

## 2021-01-06 LAB — COMPLETE METABOLIC PANEL WITH GFR
AG Ratio: 2 (calc) (ref 1.0–2.5)
ALT: 15 U/L (ref 9–46)
AST: 14 U/L (ref 10–35)
Albumin: 4.3 g/dL (ref 3.6–5.1)
Alkaline phosphatase (APISO): 66 U/L (ref 35–144)
BUN: 14 mg/dL (ref 7–25)
CO2: 28 mmol/L (ref 20–32)
Calcium: 9.2 mg/dL (ref 8.6–10.3)
Chloride: 103 mmol/L (ref 98–110)
Creat: 0.87 mg/dL (ref 0.70–1.30)
Globulin: 2.1 g/dL (calc) (ref 1.9–3.7)
Glucose, Bld: 117 mg/dL — ABNORMAL HIGH (ref 65–99)
Potassium: 4.3 mmol/L (ref 3.5–5.3)
Sodium: 139 mmol/L (ref 135–146)
Total Bilirubin: 0.4 mg/dL (ref 0.2–1.2)
Total Protein: 6.4 g/dL (ref 6.1–8.1)
eGFR: 100 mL/min/{1.73_m2} (ref >=60)

## 2021-01-06 LAB — HEMOGLOBIN A1C
Hgb A1c MFr Bld: 6 % of total Hgb — ABNORMAL HIGH (ref <5.7)
Mean Plasma Glucose: 126 mg/dL
eAG (mmol/L): 7 mmol/L

## 2021-01-06 LAB — CBC WITH DIFFERENTIAL/PLATELET
Absolute Monocytes: 390 cells/uL (ref 200–950)
Basophils Absolute: 48 cells/uL (ref 0–200)
Basophils Relative: 0.8 %
Eosinophils Absolute: 318 cells/uL (ref 15–500)
Eosinophils Relative: 5.3 %
HCT: 43.7 % (ref 38.5–50.0)
Hemoglobin: 14.6 g/dL (ref 13.2–17.1)
Lymphs Abs: 1878 cells/uL (ref 850–3900)
MCH: 31.7 pg (ref 27.0–33.0)
MCHC: 33.4 g/dL (ref 32.0–36.0)
MCV: 95 fL (ref 80.0–100.0)
MPV: 10.1 fL (ref 7.5–12.5)
Monocytes Relative: 6.5 %
Neutro Abs: 3366 cells/uL (ref 1500–7800)
Neutrophils Relative %: 56.1 %
Platelets: 183 10*3/uL (ref 140–400)
RBC: 4.6 10*6/uL (ref 4.20–5.80)
RDW: 12.8 % (ref 11.0–15.0)
Total Lymphocyte: 31.3 %
WBC: 6 10*3/uL (ref 3.8–10.8)

## 2021-01-06 LAB — MICROALBUMIN / CREATININE URINE RATIO
Creatinine, Urine: 62 mg/dL (ref 20–320)
Microalb, Ur: <0.2 mg/dL

## 2021-01-06 LAB — PSA: PSA: 0.55 ng/mL (ref <=4.00)

## 2021-01-08 ENCOUNTER — Ambulatory Visit: Payer: BC Managed Care – PPO | Admitting: Family Medicine

## 2021-01-08 ENCOUNTER — Other Ambulatory Visit: Payer: Self-pay

## 2021-01-08 ENCOUNTER — Encounter: Payer: Self-pay | Admitting: Family Medicine

## 2021-01-08 VITALS — BP 122/80 | HR 86 | Temp 98.8°F | Resp 14 | Ht 73.0 in | Wt 226.0 lb

## 2021-01-08 DIAGNOSIS — E349 Endocrine disorder, unspecified: Secondary | ICD-10-CM

## 2021-01-08 DIAGNOSIS — E785 Hyperlipidemia, unspecified: Secondary | ICD-10-CM | POA: Diagnosis not present

## 2021-01-08 DIAGNOSIS — E119 Type 2 diabetes mellitus without complications: Secondary | ICD-10-CM

## 2021-01-08 MED ORDER — SILDENAFIL CITRATE 100 MG PO TABS
50.0000 mg | ORAL_TABLET | Freq: Every day | ORAL | 11 refills | Status: DC | PRN
  Filled 2021-01-08: qty 5, 5d supply, fill #0

## 2021-01-08 NOTE — Progress Notes (Signed)
Subjective:    Patient ID: Kyle Campbell, male    DOB: 1961/01/23, 60 y.o.   MRN: [id number] 09/19/20 Patient had [place] in January.  He states ever since January he just has no energy.  He denies any chest pain.  He denies any dyspnea on exertion.  He denies any orthopnea.  He denies any shortness of breath out of proportion to before.  He reports profound fatigue.  He just feels tired.  He reports decreasing energy and decreasing muscle strength.  He denies any weight loss.  He denies any nausea or vomiting abdominal pain.  He denies any melena or hematochezia.  He denies any fevers chills or night sweats.  He does have a history of hypergonadism and is no longer on treatment.  At that time, my plan was: His exam today is reassuring.  I recommended a lab work-up for possible causes of fatigue including a CBC, CMP, B12, TSH, testosterone level, and a sedimentation rate.  Await the results of lab work however I suspect hypogonadism is most likely to blame.  Patient previously benefited from taking testosterone and would be willing to reconsider resuming it now if it would help his fatigue   09/30/20 As shown on the lab work above, his hemoglobin A1c indicates that his diabetes is out of control.  His testosterone was also extremely low.  He is here today to discuss both issues.  He has been taking Trulicity 1.5 mg subcu weekly.  He denies any change in his diet or his exercise.  He was on prednisone with COVID however that was quite sometime ago his breathing has returned to normal.  He denies any polyuria, polydipsia or blurry vision.  He denies any hypoglycemic episodes.  At that time, my plan was:  We will start the patient on metformin 1000 mg twice daily.  He will wean himself up to that dose over the next 1 to 2 weeks.  We will also start Actos 30 mg a day and continue Trulicity.  Recheck fasting blood sugar and 2-hour postprandial sugars in 1 month.  If sugars are not under 130 fasting in the  morning and under 182 hours after meals, we may need to add additional medication at that time.  We have deferred treating hypogonadism at the present time until his sugars have improved but he can resume testosterone replacement in the future if his fatigue persist despite correcting his hyperglycemia  10/21/20 Patient is here today for follow-up.  He has not checked his blood sugar since I last saw him.  He checked it this morning it was 150.  This the only blood sugar he has to report.  Therefore is difficult to determine if the metformin and Actos or sufficient in helping to manage his blood sugar.  He also request to resume testosterone.  He states that he is just feeling extremely weak and tired.  He has very little stamina and very little energy.  He would like to resume the previous dose of testosterone that he was taking which was 200 mg every 2 weeks.  He reports muscle weakness and fatigue and poor libido.  He feels that he benefited from the testosterone in the past.  At that time, my plan was:  Check fasting blood sugar and 2-hour postprandial sugars over the next 5 days and report the values to me on Monday.  If fasting blood sugars are consistently greater than 130 and/or 2-hour postprandial sugars are consistently greater than 180, I  would recommend adding Jardiance 25 mg a day to his therapy and then rechecking an A1c in 3 months.  Resume testosterone 200 mg IM every 2 weeks and then recheck a CBC and a PSA in 3 months along with a testosterone level.  01/08/21 Sugars were good so Jardiance was not added.  Most recent labs are listed below: Wt Readings from Last 3 Encounters:  01/08/21 226 lb (102.5 kg)  10/21/20 224 lb (101.6 kg)  09/30/20 219 lb (99.3 kg)    Lab on 01/05/2021  Component Date Value Ref Range Status   WBC 01/05/2021 6.0  3.8 - 10.8 Thousand/uL Final   RBC 01/05/2021 4.60  4.20 - 5.80 Million/uL Final   Hemoglobin 01/05/2021 14.6  13.2 - 17.1 g/dL Final   HCT  01/05/2021 43.7  38.5 - 50.0 % Final   MCV 01/05/2021 95.0  80.0 - 100.0 fL Final   MCH 01/05/2021 31.7  27.0 - 33.0 pg Final   MCHC 01/05/2021 33.4  32.0 - 36.0 g/dL Final   RDW 01/05/2021 12.8  11.0 - 15.0 % Final   Platelets 01/05/2021 183  140 - 400 Thousand/uL Final   MPV 01/05/2021 10.1  7.5 - 12.5 fL Final   Neutro Abs 01/05/2021 3,366  1,500 - 7,800 cells/uL Final   Lymphs Abs 01/05/2021 1,878  850 - 3,900 cells/uL Final   Absolute Monocytes 01/05/2021 390  200 - 950 cells/uL Final   Eosinophils Absolute 01/05/2021 318  15 - 500 cells/uL Final   Basophils Absolute 01/05/2021 48  0 - 200 cells/uL Final   Neutrophils Relative % 01/05/2021 56.1  % Final   Total Lymphocyte 01/05/2021 31.3  % Final   Monocytes Relative 01/05/2021 6.5  % Final   Eosinophils Relative 01/05/2021 5.3  % Final   Basophils Relative 01/05/2021 0.8  % Final   Glucose, Bld 01/05/2021 117 (A) 65 - 99 mg/dL Final   Comment: .            Fasting reference interval . For someone without known diabetes, a glucose value between 100 and 125 mg/dL is consistent with prediabetes and should be confirmed with a follow-up test. .    BUN 01/05/2021 14  7 - 25 mg/dL Final   Creat 01/05/2021 0.87  0.70 - 1.30 mg/dL Final   eGFR 01/05/2021 100  > OR = 60 mL/min/1.46m Final   Comment: The eGFR is based on the CKD-EPI 2021 equation. To calculate  the new eGFR from a previous Creatinine or Cystatin C result, go to https://www.kidney.org/professionals/ kdoqi/gfr%5Fcalculator    BUN/Creatinine Ratio 076/80/8811NOT APPLICABLE  6 - 22 (calc) Final   Sodium 01/05/2021 139  135 - 146 mmol/L Final   Potassium 01/05/2021 4.3  3.5 - 5.3 mmol/L Final   Chloride 01/05/2021 103  98 - 110 mmol/L Final   CO2 01/05/2021 28  20 - 32 mmol/L Final   Calcium 01/05/2021 9.2  8.6 - 10.3 mg/dL Final   Total Protein 01/05/2021 6.4  6.1 - 8.1 g/dL Final   Albumin 01/05/2021 4.3  3.6 - 5.1 g/dL Final   Globulin 01/05/2021 2.1  1.9 - 3.7  g/dL (calc) Final   AG Ratio 01/05/2021 2.0  1.0 - 2.5 (calc) Final   Total Bilirubin 01/05/2021 0.4  0.2 - 1.2 mg/dL Final   Alkaline phosphatase (APISO) 01/05/2021 66  35 - 144 U/L Final   AST 01/05/2021 14  10 - 35 U/L Final   ALT 01/05/2021 15  9 - 46 U/L  Final   Hgb A1c MFr Bld 01/05/2021 6.0 (A) <5.7 % of total Hgb Final   Comment: For someone without known diabetes, a hemoglobin  A1c value between 5.7% and 6.4% is consistent with prediabetes and should be confirmed with a  follow-up test. . For someone with known diabetes, a value <7% indicates that their diabetes is well controlled. A1c targets should be individualized based on duration of diabetes, age, comorbid conditions, and other considerations. . This assay result is consistent with an increased risk of diabetes. . Currently, no consensus exists regarding use of hemoglobin A1c for diagnosis of diabetes for children. .    Mean Plasma Glucose 01/05/2021 126  mg/dL Final   eAG (mmol/L) 01/05/2021 7.0  mmol/L Final   Creatinine, Urine 01/05/2021 62  20 - 320 mg/dL Final   Microalb, Ur 01/05/2021 <0.2  mg/dL Final   Comment: Reference Range Not established    Microalb Creat Ratio 01/05/2021 NOTE  <30 mcg/mg creat Final   Comment: NOTE: The urine albumin value is less than  0.2 mg/dL therefore we are unable to calculate  excretion and/or creatinine ratio. . The ADA defines abnormalities in albumin excretion as follows: Marland Kitchen Albuminuria Category        Result (mcg/mg creatinine) . Normal to Mildly increased   <30 Moderately increased         30-299  Severely increased           > OR = 300 . The ADA recommends that at least two of three specimens collected within a 3-6 month period be abnormal before considering a patient to be within a diagnostic category.    Cholesterol 01/05/2021 167  <200 mg/dL Final   HDL 01/05/2021 38 (A) > OR = 40 mg/dL Final   Triglycerides 01/05/2021 258 (A) <150 mg/dL Final    Comment: . If a non-fasting specimen was collected, consider repeat triglyceride testing on a fasting specimen if clinically indicated.  Yates Decamp et al. J. of Clin. Lipidol. 3734;2:876-811. Marland Kitchen    LDL Cholesterol (Calc) 01/05/2021 95  mg/dL (calc) Final   Comment: Reference range: <100 . Desirable range <100 mg/dL for primary prevention;   <70 mg/dL for patients with CHD or diabetic patients  with > or = 2 CHD risk factors. Marland Kitchen LDL-C is now calculated using the Martin-Hopkins  calculation, which is a validated novel method providing  better accuracy than the Friedewald equation in the  estimation of LDL-C.  Cresenciano Genre et al. Annamaria Helling. 5726;203(55): 2061-2068  (http://education.[facility].com/faq/FAQ164)    Total CHOL/HDL Ratio 01/05/2021 4.4  <5.0 (calc) Final   Non-HDL Cholesterol (Calc) 01/05/2021 129  <130 mg/dL (calc) Final   Comment: For patients with diabetes plus 1 major ASCVD risk  factor, treating to a non-HDL-C goal of <100 mg/dL  (LDL-C of <70 mg/dL) is considered a therapeutic  option.    PSA 01/05/2021 0.55  < OR = 4.00 ng/mL Final   Comment: The total PSA value from this assay system is  standardized against the WHO standard. The test  result will be approximately 20% lower when compared  to the equimolar-standardized total PSA (Beckman  Coulter). Comparison of serial PSA results should be  interpreted with this fact in mind. . This test was performed using the Siemens  chemiluminescent method. Values obtained from  different assay methods cannot be used interchangeably. PSA levels, regardless of value, should not be interpreted as absolute evidence of the presence or absence of disease.   Patient came back for repeat  testosterone level in July.  However at that time, his blood sugars had improved dramatically.  His repeat fasting testosterone level was 370 up from barely over 200.  As a result his insurance would not cover testosterone replacement.  However he  states that his fatigue has improved since his sugars have responded.  He states that he is feeling better.  He has maintained his weight.  He was 224 at his last visit he is to 22 now.  He denies any hypoglycemic episodes.  His hemoglobin A1c has fallen from 11-6.0!.  This is the best his blood sugar has been in the last several years.  He denies any chest pain shortness of breath or dyspnea on exertion.  His blood pressure today is well controlled.   Past Medical History:  Diagnosis Date   Asthma    Diabetes mellitus without complication (HCC)    Erectile dysfunction    GERD (gastroesophageal reflux disease)    Hyperlipidemia    Obstructive sleep apnea    compliant with CPAP   Testosterone deficiency    Past Surgical History:  Procedure Laterality Date   TONSILLECTOMY     age 46   Current Outpatient Medications on File Prior to Visit  Medication Sig Dispense Refill   albuterol (PROVENTIL) (2.5 MG/3ML) 0.083% nebulizer solution USE ONE VIAL VIA NEBULIZER EVERY 6 HOURS AS NEEDED FOR WHEEZING OR SHORTNESS OF BREATH. 150 mL 1   albuterol (VENTOLIN HFA) 108 (90 Base) MCG/ACT inhaler INHALE 1 PUFF BY MOUTH EVERY FOUR HOURS AS NEEDED 18 g 3   atorvastatin (LIPITOR) 10 MG tablet TAKE 1 TABLET BY MOUTH DAILY. 90 tablet 3   fluticasone-salmeterol (ADVAIR) 250-50 MCG/ACT AEPB Inhale 1 puff into the lungs 2 (two) times daily. 60 each 0   Fluticasone-Salmeterol (ADVAIR) 250-50 MCG/DOSE AEPB INHALE ONE PUFF BY MOUTH TWICE A DAY 60 each 3   glucose blood (ACCU-CHEK AVIVA PLUS) test strip Use as instructed 100 each 12   guaiFENesin (MUCINEX) 600 MG 12 hr tablet Take 1 tablet (600 mg total) by mouth 2 (two) times daily as needed for cough or to loosen phlegm. 30 tablet 0   Insulin Pen Needle 29G X 12MM MISC 1 Dose by Does not apply route every 14 (fourteen) days. 30 each 3   Lancets (ACCU-CHEK MULTICLIX) lancets Use as instructed 100 each 12   metFORMIN (GLUCOPHAGE) 500 MG tablet Take 2 tablets (1,000  mg total) by mouth 2 (two) times daily with a meal. 120 tablet 3   pioglitazone (ACTOS) 30 MG tablet Take 1 tablet (30 mg total) by mouth daily. 30 tablet 3   sildenafil (VIAGRA) 100 MG tablet Take 0.5-1 tablets (50-100 mg total) by mouth daily as needed for erectile dysfunction. 5 tablet 11   tamsulosin (FLOMAX) 0.4 MG CAPS capsule TAKE 1 CAPSULE BY MOUTH EVERY DAY 90 capsule 1   testosterone cypionate (DEPOTESTOSTERONE CYPIONATE) 200 MG/ML injection Inject 1 mL (200 mg total) into the muscle every 14 (fourteen) days. 6 mL 3   traMADol (ULTRAM) 50 MG tablet Take 1 tablet (50 mg total) by mouth every 6 (six) hours as needed. for pain 90 tablet 0   Dulaglutide (TRULICITY) 1.5 WC/5.8NI SOPN INJECT 1.5MG SUBCUTANEOUSLY ONCE A WEEK 2 mL 11   UNABLE TO FIND CPAP mask - DX: OSA G47.33 1 Device 0   No current facility-administered medications on file prior to visit.   No Known Allergies Social History   Socioeconomic History   Marital status: Married  Spouse name: Not on file   Number of children: Not on file   Years of education: Not on file   Highest education level: Not on file  Occupational History   Not on file  Tobacco Use   Smoking status: Never   Smokeless tobacco: Never  Substance and Sexual Activity   Alcohol use: Yes    Alcohol/week: 6.0 standard drinks    Types: 1 Standard drinks or equivalent, 5 Shots of liquor per week    Comment: weekend only   Drug use: No   Sexual activity: Yes    Comment: Married.    Other Topics Concern   Not on file  Social History Narrative   Has 3 children. One 56 year, and 31 year old twins. Works as Administrator. Occasional  ETOH.    Social Determinants of Health   Financial Resource Strain: Not on file  Food Insecurity: Not on file  Transportation Needs: Not on file  Physical Activity: Not on file  Stress: Not on file  Social Connections: Not on file  Intimate Partner Violence: Not on file      Review of Systems  All other  systems reviewed and are negative.     Objective:   Physical Exam Constitutional:      General: He is not in acute distress.    Appearance: He is well-developed. He is not diaphoretic.  Eyes:     Conjunctiva/sclera: Conjunctivae normal.     Pupils: Pupils are equal, round, and reactive to light.  Neck:     Thyroid: No thyromegaly.     Vascular: No JVD.  Cardiovascular:     Rate and Rhythm: Normal rate and regular rhythm.     Heart sounds: Normal heart sounds. No murmur heard.   No friction rub. No gallop.  Pulmonary:     Effort: Pulmonary effort is normal. No respiratory distress.     Breath sounds: Normal breath sounds. No wheezing or rales.  Abdominal:     General: Bowel sounds are normal. There is no distension.     Palpations: Abdomen is soft.     Tenderness: There is no abdominal tenderness. There is no rebound.  Genitourinary:    Prostate: Normal.     Rectum: Normal.  Musculoskeletal:     Cervical back: Neck supple.  Lymphadenopathy:     Cervical: No cervical adenopathy.  Neurological:     Mental Status: He is alert and oriented to person, place, and time.     Cranial Nerves: No cranial nerve deficit.     Motor: No abnormal muscle tone.     Coordination: Coordination normal.     Deep Tendon Reflexes: Reflexes are normal and symmetric.          Assessment & Plan:  Testosterone deficiency  Controlled type 2 diabetes mellitus without complication, without long-term current use of insulin (HCC)  Dyslipidemia Triglycerides are over 250 however he admits he has not been taking his Lipitor daily.  LDL cholesterol still excellent at less than 100.  I encouraged him to be consistent with his Lipitor.  His urine microalbumin was normal.  His A1c was down to 6.0.  I am ecstatic about the results of this lab work.  I encouraged him to continue Actos, metformin, and Trulicity as prescribed.  Blood pressure today is excellent.  Patient elects not to recheck testosterone  level since he is feeling better.

## 2021-01-16 ENCOUNTER — Other Ambulatory Visit: Payer: Self-pay

## 2021-01-19 ENCOUNTER — Other Ambulatory Visit: Payer: Self-pay

## 2021-02-05 ENCOUNTER — Other Ambulatory Visit: Payer: Self-pay

## 2021-02-05 ENCOUNTER — Other Ambulatory Visit: Payer: Self-pay | Admitting: Family Medicine

## 2021-02-06 ENCOUNTER — Other Ambulatory Visit: Payer: Self-pay

## 2021-02-06 MED FILL — Albuterol Sulfate Inhal Aero 108 MCG/ACT (90MCG Base Equiv): RESPIRATORY_TRACT | 30 days supply | Qty: 18 | Fill #0 | Status: AC

## 2021-02-06 MED FILL — Tramadol HCl Tab 50 MG: ORAL | 23 days supply | Qty: 90 | Fill #0 | Status: AC

## 2021-02-06 NOTE — Telephone Encounter (Signed)
Pt called in again inquiring about a refill for traMADol (ULTRAM) 50 MG tablet and albuterol (VENTOLIN HFA) 108 (90

## 2021-02-26 ENCOUNTER — Other Ambulatory Visit: Payer: Self-pay

## 2021-02-26 ENCOUNTER — Other Ambulatory Visit: Payer: Self-pay | Admitting: Family Medicine

## 2021-02-26 MED FILL — Pioglitazone HCl Tab 30 MG (Base Equiv): ORAL | 30 days supply | Qty: 30 | Fill #0 | Status: AC

## 2021-02-26 MED FILL — Dulaglutide Soln Auto-injector 1.5 MG/0.5ML: SUBCUTANEOUS | 28 days supply | Qty: 2 | Fill #3 | Status: AC

## 2021-02-27 ENCOUNTER — Other Ambulatory Visit: Payer: Self-pay

## 2021-03-12 ENCOUNTER — Other Ambulatory Visit: Payer: Self-pay | Admitting: Family Medicine

## 2021-03-12 ENCOUNTER — Other Ambulatory Visit: Payer: Self-pay

## 2021-03-12 NOTE — Telephone Encounter (Signed)
Ok to refill??  Last office visit 01/08/2021.  Last refill 02/06/2021.

## 2021-03-13 ENCOUNTER — Other Ambulatory Visit: Payer: Self-pay

## 2021-03-13 MED ORDER — TRAMADOL HCL 50 MG PO TABS
50.0000 mg | ORAL_TABLET | Freq: Four times a day (QID) | ORAL | 0 refills | Status: DC | PRN
  Filled 2021-03-13: qty 90, 23d supply, fill #0

## 2021-03-16 ENCOUNTER — Other Ambulatory Visit: Payer: Self-pay

## 2021-03-17 ENCOUNTER — Other Ambulatory Visit: Payer: Self-pay

## 2021-03-19 ENCOUNTER — Other Ambulatory Visit: Payer: Self-pay

## 2021-03-20 ENCOUNTER — Other Ambulatory Visit: Payer: Self-pay

## 2021-03-20 MED ORDER — TAMSULOSIN HCL 0.4 MG PO CAPS
0.4000 mg | ORAL_CAPSULE | Freq: Every day | ORAL | 1 refills | Status: DC
  Filled 2021-03-20: qty 90, 90d supply, fill #0

## 2021-03-23 ENCOUNTER — Other Ambulatory Visit: Payer: Self-pay

## 2021-03-26 ENCOUNTER — Other Ambulatory Visit: Payer: Self-pay | Admitting: Family Medicine

## 2021-03-26 ENCOUNTER — Other Ambulatory Visit: Payer: Self-pay

## 2021-03-26 MED ORDER — TAMSULOSIN HCL 0.4 MG PO CAPS
0.4000 mg | ORAL_CAPSULE | Freq: Every day | ORAL | 3 refills | Status: DC
  Filled 2021-03-26 (×2): qty 90, 90d supply, fill #0
  Filled 2021-07-29: qty 90, 90d supply, fill #1

## 2021-03-26 MED ORDER — FLUTICASONE-SALMETEROL 250-50 MCG/ACT IN AEPB
1.0000 | INHALATION_SPRAY | Freq: Two times a day (BID) | RESPIRATORY_TRACT | 3 refills | Status: DC
  Filled 2021-03-26: qty 60, 30d supply, fill #0
  Filled 2021-06-01: qty 60, 30d supply, fill #1
  Filled 2021-08-04: qty 60, 30d supply, fill #2
  Filled 2021-09-09: qty 60, 30d supply, fill #3

## 2021-04-01 ENCOUNTER — Other Ambulatory Visit: Payer: Self-pay

## 2021-04-01 MED FILL — Dulaglutide Soln Auto-injector 1.5 MG/0.5ML: SUBCUTANEOUS | 28 days supply | Qty: 2 | Fill #4 | Status: AC

## 2021-04-13 ENCOUNTER — Other Ambulatory Visit: Payer: Self-pay | Admitting: Family Medicine

## 2021-04-13 ENCOUNTER — Other Ambulatory Visit: Payer: Self-pay

## 2021-04-13 MED ORDER — ATORVASTATIN CALCIUM 10 MG PO TABS
ORAL_TABLET | Freq: Every day | ORAL | 3 refills | Status: DC
  Filled 2021-04-13: qty 90, 90d supply, fill #0

## 2021-04-13 MED FILL — Pioglitazone HCl Tab 30 MG (Base Equiv): ORAL | 30 days supply | Qty: 30 | Fill #1 | Status: AC

## 2021-04-13 NOTE — Telephone Encounter (Signed)
LOV 01/08/21 Last refill 03/13/21, #90, 0 refills  Please review, thanks!

## 2021-04-14 ENCOUNTER — Other Ambulatory Visit: Payer: Self-pay

## 2021-04-14 MED ORDER — TRAMADOL HCL 50 MG PO TABS
50.0000 mg | ORAL_TABLET | Freq: Four times a day (QID) | ORAL | 0 refills | Status: DC | PRN
  Filled 2021-04-14: qty 90, 23d supply, fill #0

## 2021-05-07 ENCOUNTER — Other Ambulatory Visit: Payer: Self-pay

## 2021-05-07 MED FILL — Dulaglutide Soln Auto-injector 1.5 MG/0.5ML: SUBCUTANEOUS | 28 days supply | Qty: 2 | Fill #5 | Status: AC

## 2021-05-11 ENCOUNTER — Encounter: Payer: Self-pay | Admitting: Gastroenterology

## 2021-05-14 ENCOUNTER — Other Ambulatory Visit: Payer: Self-pay

## 2021-05-14 ENCOUNTER — Other Ambulatory Visit: Payer: Self-pay | Admitting: Family Medicine

## 2021-05-14 MED ORDER — METFORMIN HCL 500 MG PO TABS
1000.0000 mg | ORAL_TABLET | Freq: Two times a day (BID) | ORAL | 3 refills | Status: DC
  Filled 2021-05-14: qty 120, 30d supply, fill #0
  Filled 2021-06-15: qty 120, 30d supply, fill #1

## 2021-05-14 MED ORDER — TRAMADOL HCL 50 MG PO TABS
50.0000 mg | ORAL_TABLET | Freq: Four times a day (QID) | ORAL | 0 refills | Status: DC | PRN
  Filled 2021-05-14: qty 90, 23d supply, fill #0

## 2021-05-14 NOTE — Telephone Encounter (Signed)
Last OV--01/08/21 Next appt--07/09/21

## 2021-06-01 ENCOUNTER — Other Ambulatory Visit: Payer: Self-pay

## 2021-06-01 MED FILL — Pioglitazone HCl Tab 30 MG (Base Equiv): ORAL | 30 days supply | Qty: 30 | Fill #2 | Status: AC

## 2021-06-04 ENCOUNTER — Other Ambulatory Visit: Payer: Self-pay

## 2021-06-04 MED FILL — Dulaglutide Soln Auto-injector 1.5 MG/0.5ML: SUBCUTANEOUS | 28 days supply | Qty: 2 | Fill #6 | Status: AC

## 2021-06-15 ENCOUNTER — Other Ambulatory Visit: Payer: Self-pay | Admitting: Family Medicine

## 2021-06-15 ENCOUNTER — Other Ambulatory Visit: Payer: Self-pay

## 2021-06-15 MED FILL — Albuterol Sulfate Inhal Aero 108 MCG/ACT (90MCG Base Equiv): RESPIRATORY_TRACT | 30 days supply | Qty: 18 | Fill #1 | Status: AC

## 2021-06-15 NOTE — Telephone Encounter (Signed)
Tramadol refill request.  Last seen 01/08/21, last filled 05/14/2021

## 2021-06-16 ENCOUNTER — Other Ambulatory Visit: Payer: Self-pay

## 2021-06-16 MED ORDER — TRAMADOL HCL 50 MG PO TABS
50.0000 mg | ORAL_TABLET | Freq: Four times a day (QID) | ORAL | 0 refills | Status: DC | PRN
  Filled 2021-06-16: qty 90, 23d supply, fill #0

## 2021-06-16 NOTE — Telephone Encounter (Signed)
Tramadol refill request.  Last seen 01/08/21, last filled 05/14/2021 °

## 2021-07-09 ENCOUNTER — Ambulatory Visit: Payer: BC Managed Care – PPO | Admitting: Family Medicine

## 2021-07-12 MED FILL — Dulaglutide Soln Auto-injector 1.5 MG/0.5ML: SUBCUTANEOUS | 28 days supply | Qty: 2 | Fill #7 | Status: AC

## 2021-07-13 ENCOUNTER — Other Ambulatory Visit: Payer: Self-pay

## 2021-07-16 ENCOUNTER — Other Ambulatory Visit: Payer: Self-pay

## 2021-07-16 ENCOUNTER — Other Ambulatory Visit: Payer: Self-pay | Admitting: Family Medicine

## 2021-07-16 MED FILL — Pioglitazone HCl Tab 30 MG (Base Equiv): ORAL | 30 days supply | Qty: 30 | Fill #3 | Status: AC

## 2021-07-16 MED FILL — Tramadol HCl Tab 50 MG: ORAL | 23 days supply | Qty: 90 | Fill #0 | Status: AC

## 2021-07-29 ENCOUNTER — Other Ambulatory Visit: Payer: Self-pay

## 2021-08-03 ENCOUNTER — Other Ambulatory Visit: Payer: Self-pay

## 2021-08-03 ENCOUNTER — Other Ambulatory Visit: Payer: BC Managed Care – PPO

## 2021-08-03 DIAGNOSIS — E119 Type 2 diabetes mellitus without complications: Secondary | ICD-10-CM

## 2021-08-03 DIAGNOSIS — E349 Endocrine disorder, unspecified: Secondary | ICD-10-CM

## 2021-08-04 LAB — HEMOGLOBIN A1C
Hgb A1c MFr Bld: 5.9 % of total Hgb — ABNORMAL HIGH (ref <5.7)
Mean Plasma Glucose: 123 mg/dL
eAG (mmol/L): 6.8 mmol/L

## 2021-08-05 ENCOUNTER — Other Ambulatory Visit: Payer: Self-pay

## 2021-08-05 MED FILL — Albuterol Sulfate Inhal Aero 108 MCG/ACT (90MCG Base Equiv): RESPIRATORY_TRACT | 30 days supply | Qty: 18 | Fill #2 | Status: AC

## 2021-08-10 ENCOUNTER — Ambulatory Visit: Payer: BC Managed Care – PPO | Admitting: Family Medicine

## 2021-08-10 ENCOUNTER — Other Ambulatory Visit: Payer: Self-pay

## 2021-08-10 VITALS — BP 122/72 | HR 77 | Temp 97.3°F | Ht 73.0 in | Wt 224.2 lb

## 2021-08-10 DIAGNOSIS — E119 Type 2 diabetes mellitus without complications: Secondary | ICD-10-CM

## 2021-08-10 DIAGNOSIS — J4541 Moderate persistent asthma with (acute) exacerbation: Secondary | ICD-10-CM | POA: Diagnosis not present

## 2021-08-10 MED ORDER — TRAMADOL HCL 50 MG PO TABS
50.0000 mg | ORAL_TABLET | Freq: Four times a day (QID) | ORAL | 0 refills | Status: DC | PRN
  Filled 2021-08-10: qty 90, 23d supply, fill #0

## 2021-08-10 MED ORDER — PREDNISONE 20 MG PO TABS
ORAL_TABLET | ORAL | 0 refills | Status: DC
  Filled 2021-08-10: qty 12, 6d supply, fill #0

## 2021-08-10 MED FILL — Dulaglutide Soln Auto-injector 1.5 MG/0.5ML: SUBCUTANEOUS | 28 days supply | Qty: 2 | Fill #8 | Status: AC

## 2021-08-10 NOTE — Progress Notes (Signed)
? ?Subjective:  ? ? Patient ID: Kyle Campbell, male    DOB: July 21, 1960, 61 y.o.   MRN: [id number] ?Patient's blood pressure today is outstanding.  He denies any chest pain.Marland Kitchen  However he recently has been suffering from asthma.  He is wheezing today on exam.  He is having to use his albuterol more frequently.  He has only been taking his Advair once a day.  He was confused about how to take it.  He states that he does well during the morning hours but at night, he gets worse and then he wakes up the following morning and has to use albuterol.  He has been coughing more due to allergies.  He denies any fever or purulent sputum.  His most recent A1c was outstanding at 5.9.  He denies any neuropathy in his feet.  He denies any blurry vision.  He denies any polyuria or polydipsia ? ?Past Medical History:  ?Diagnosis Date  ?? Asthma   ?? Diabetes mellitus without complication ([place])   ?? Erectile dysfunction   ?? GERD (gastroesophageal reflux disease)   ?? Hyperlipidemia   ?? Obstructive sleep apnea   ? compliant with CPAP  ?? Testosterone deficiency   ? ?Past Surgical History:  ?Procedure Laterality Date  ?? TONSILLECTOMY    ? age 56  ? ?Current Outpatient Medications on File Prior to Visit  ?Medication Sig Dispense Refill  ?? albuterol (VENTOLIN HFA) 108 (90 Base) MCG/ACT inhaler INHALE 1 PUFF BY MOUTH EVERY FOUR HOURS AS NEEDED 18 g 3  ?? atorvastatin (LIPITOR) 10 MG tablet TAKE 1 TABLET BY MOUTH DAILY. 90 tablet 3  ?? Dulaglutide (TRULICITY) 1.5 MA/2.6JF SOPN INJECT 1.5MG SUBCUTANEOUSLY ONCE A WEEK 2 mL 11  ?? FLOWFLEX COVID-19 AG HOME TEST KIT FOLLOW THE test instructions FOR using THE at-home covid test.    ?? fluticasone-salmeterol (ADVAIR DISKUS) 250-50 MCG/ACT AEPB Inhale 1 puff into the lungs 2 (two) times daily. 60 each 3  ?? glucose blood (ACCU-CHEK AVIVA PLUS) test strip Use as instructed 100 each 12  ?? Insulin Pen Needle 29G X 12MM MISC 1 Dose by Does not apply route every 14 (fourteen) days. 30 each 3  ??  Lancets (ACCU-CHEK MULTICLIX) lancets Use as instructed 100 each 12  ?? metFORMIN (GLUCOPHAGE) 500 MG tablet Take 2 tablets (1,000 mg total) by mouth 2 (two) times daily with a meal. 120 tablet 3  ?? sildenafil (VIAGRA) 100 MG tablet Take 0.5-1 tablets (50-100 mg total) by mouth daily as needed for erectile dysfunction. 5 tablet 11  ?? tamsulosin (FLOMAX) 0.4 MG CAPS capsule Take 1 capsule (0.4 mg total) by mouth daily. 90 capsule 3  ?? UNABLE TO FIND CPAP mask - DX: OSA G47.33 1 Device 0  ?? albuterol (PROVENTIL) (2.5 MG/3ML) 0.083% nebulizer solution USE ONE VIAL VIA NEBULIZER EVERY 6 HOURS AS NEEDED FOR WHEEZING OR SHORTNESS OF BREATH. 150 mL 1  ? ?No current facility-administered medications on file prior to visit.  ? ?No Known Allergies ?Social History  ? ?Socioeconomic History  ?? Marital status: Married  ?  Spouse name: Not on file  ?? Number of children: Not on file  ?? Years of education: Not on file  ?? Highest education level: Not on file  ?Occupational History  ?? Not on file  ?Tobacco Use  ?? Smoking status: Never  ?? Smokeless tobacco: Never  ?Substance and Sexual Activity  ?? Alcohol use: Yes  ?  Alcohol/week: 6.0 standard drinks  ?  Types:  1 Standard drinks or equivalent, 5 Shots of liquor per week  ?  Comment: weekend only  ?? Drug use: No  ?? Sexual activity: Yes  ?  Comment: Married.    ?Other Topics Concern  ?? Not on file  ?Social History Narrative  ? Has 3 children. One 85 year, and 61 year old twins. Works as Administrator. Occasional  ETOH.   ? ?Social Determinants of Health  ? ?Financial Resource Strain: Not on file  ?Food Insecurity: Not on file  ?Transportation Needs: Not on file  ?Physical Activity: Not on file  ?Stress: Not on file  ?Social Connections: Not on file  ?Intimate Partner Violence: Not on file  ? ? ? ? ?Review of Systems  ?All other systems reviewed and are negative. ? ?   ?Objective:  ? Physical Exam ?Constitutional:   ?   General: He is not in acute distress. ?   Appearance:  He is well-developed. He is not diaphoretic.  ?Eyes:  ?   Conjunctiva/sclera: Conjunctivae normal.  ?   Pupils: Pupils are equal, round, and reactive to light.  ?Neck:  ?   Thyroid: No thyromegaly.  ?   Vascular: No JVD.  ?Cardiovascular:  ?   Rate and Rhythm: Normal rate and regular rhythm.  ?   Heart sounds: Normal heart sounds. No murmur heard. ?  No friction rub. No gallop.  ?Pulmonary:  ?   Effort: Pulmonary effort is normal. No respiratory distress.  ?   Breath sounds: Decreased air movement present. Wheezing present. No rhonchi or rales.  ?Abdominal:  ?   General: Bowel sounds are normal. There is no distension.  ?   Palpations: Abdomen is soft.  ?   Tenderness: There is no abdominal tenderness. There is no rebound.  ?Genitourinary: ?   Prostate: Normal.  ?   Rectum: Normal.  ?Musculoskeletal:  ?   Cervical back: Neck supple.  ?Lymphadenopathy:  ?   Cervical: No cervical adenopathy.  ?Neurological:  ?   Mental Status: He is alert and oriented to person, place, and time.  ?   Cranial Nerves: No cranial nerve deficit.  ?   Motor: No abnormal muscle tone.  ?   Coordination: Coordination normal.  ?   Deep Tendon Reflexes: Reflexes are normal and symmetric.  ? ? ? ? ? ?   ?Assessment & Plan:  ?Controlled type 2 diabetes mellitus without complication, without long-term current use of insulin ([place]) ? ?Moderate persistent asthma with exacerbation ?Patient is having an asthma exacerbation although it is very mild.  I recommended that he resume Advair 250/50 1 inhalation twice daily.  Hopefully over time he will be able to wean back on his albuterol use.  If not improving I did give him a prednisone taper pack and he can start taking the prednisone to reverse the exacerbation.  His A1c is well controlled.  We will discontinue Actos and recheck his A1c in 6 months.  If maintaining excellent control at that point we will discontinue metformin.  Congratulated him on his good work ?

## 2021-08-11 ENCOUNTER — Other Ambulatory Visit: Payer: Self-pay

## 2021-09-09 ENCOUNTER — Other Ambulatory Visit: Payer: Self-pay | Admitting: Family Medicine

## 2021-09-10 ENCOUNTER — Other Ambulatory Visit: Payer: Self-pay

## 2021-09-10 MED ORDER — TRAMADOL HCL 50 MG PO TABS
50.0000 mg | ORAL_TABLET | Freq: Four times a day (QID) | ORAL | 0 refills | Status: DC | PRN
  Filled 2021-09-10: qty 90, 23d supply, fill #0

## 2021-09-10 NOTE — Telephone Encounter (Signed)
LOV 08/10/21 ?Last refill 08/10/21, #90, 0 refills ? ?Please review, thanks! ? ?

## 2021-09-10 NOTE — Telephone Encounter (Signed)
Requested medication (s) are due for refill today no ? ?Requested medication (s) are on the active medication list: yes ? ?Last refill:  08/10/21 ? ?Future visit scheduled: no ? ?Notes to clinic:  Unable to refill per protocol, cannot delegate. ? ? ? ?  ?Requested Prescriptions  ?Pending Prescriptions Disp Refills  ? traMADol (ULTRAM) 50 MG tablet 90 tablet 0  ?  Sig: Take 1 tablet (50 mg total) by mouth every 6 (six) hours as needed. for pain  ?  ? Not Delegated - Analgesics:  Opioid Agonists Failed - 09/09/2021  8:31 PM  ?  ?  Failed - This refill cannot be delegated  ?  ?  Failed - Urine Drug Screen completed in last 360 days  ?  ?  Passed - Valid encounter within last 3 months  ?  Recent Outpatient Visits   ? ?      ? 1 month ago Controlled type 2 diabetes mellitus without complication, without long-term current use of insulin ([place])  ? [facility] [name], Cammie Mcgee, MD  ? 8 months ago Testosterone deficiency  ? [facility] [name], Cammie Mcgee, MD  ? 10 months ago Uncontrolled type 2 diabetes mellitus with hyperglycemia ([place])  ? [facility] [name], Cammie Mcgee, MD  ? 11 months ago Uncontrolled type 2 diabetes mellitus with hyperglycemia ([place])  ? [facility] [name], Cammie Mcgee, MD  ? 11 months ago Chronic fatigue  ? [facility] [name], Cammie Mcgee, MD  ? ?  ?  ? ? ?  ?  ?  ? ? ?

## 2021-09-23 ENCOUNTER — Other Ambulatory Visit: Payer: Self-pay

## 2021-09-23 MED FILL — Dulaglutide Soln Auto-injector 1.5 MG/0.5ML: SUBCUTANEOUS | 28 days supply | Qty: 2 | Fill #9 | Status: AC

## 2021-10-12 ENCOUNTER — Other Ambulatory Visit: Payer: Self-pay | Admitting: Family Medicine

## 2021-10-13 ENCOUNTER — Other Ambulatory Visit: Payer: Self-pay | Admitting: Family Medicine

## 2021-10-13 ENCOUNTER — Other Ambulatory Visit: Payer: Self-pay

## 2021-10-13 MED ORDER — FLUTICASONE-SALMETEROL 250-50 MCG/ACT IN AEPB
1.0000 | INHALATION_SPRAY | Freq: Two times a day (BID) | RESPIRATORY_TRACT | 3 refills | Status: DC
Start: 2021-10-13 — End: 2022-05-17
  Filled 2021-10-13: qty 60, 30d supply, fill #0
  Filled 2021-12-14: qty 60, 30d supply, fill #1
  Filled 2022-01-26: qty 60, 30d supply, fill #2
  Filled 2022-03-25: qty 60, 30d supply, fill #3

## 2021-10-13 NOTE — Telephone Encounter (Signed)
Requested medication (s) are due for refill today: yes  Requested medication (s) are on the active medication list: yes  Last refill:  09/10/21 #90  Future visit scheduled: no  Notes to clinic:  med not delegated to NT to RF   Requested Prescriptions  Pending Prescriptions Disp Refills   traMADol (ULTRAM) 50 MG tablet 90 tablet     Sig: Take 1 tablet (50 mg total) by mouth every 6 (six) hours as needed. for pain     Not Delegated - Analgesics:  Opioid Agonists Failed - 10/12/2021  8:13 AM      Failed - This refill cannot be delegated      Failed - Urine Drug Screen completed in last 360 days      Passed - Valid encounter within last 3 months    Recent Outpatient Visits           2 months ago Controlled type 2 diabetes mellitus without complication, without long-term current use of insulin (HCC)   [facility] Medicine [name], Priscille Heidelberg, MD   9 months ago Testosterone deficiency   [facility] Medicine [name], Priscille Heidelberg, MD   11 months ago Uncontrolled type 2 diabetes mellitus with hyperglycemia (HCC)   [facility] Medicine Donita Brooks, MD   1 year ago Uncontrolled type 2 diabetes mellitus with hyperglycemia (HCC)   [facility] Medicine [name], Priscille Heidelberg, MD   1 year ago Chronic fatigue   [facility] [name], Priscille Heidelberg, MD               Signed Prescriptions Disp Refills   fluticasone-salmeterol (ADVAIR DISKUS) 250-50 MCG/ACT AEPB 60 each 3    Sig: Inhale 1 puff into the lungs 2 (two) times daily.     Pulmonology:  Combination Products Passed - 10/12/2021  8:13 AM      Passed - Valid encounter within last 12 months    Recent Outpatient Visits           2 months ago Controlled type 2 diabetes mellitus without complication, without long-term current use of insulin (HCC)   [facility] Medicine [name], Priscille Heidelberg, MD   9 months ago Testosterone deficiency   [facility] Medicine [name], Priscille Heidelberg, MD   11  months ago Uncontrolled type 2 diabetes mellitus with hyperglycemia (HCC)   [facility] Medicine Donita Brooks, MD   1 year ago Uncontrolled type 2 diabetes mellitus with hyperglycemia (HCC)   [facility] [name], Priscille Heidelberg, MD   1 year ago Chronic fatigue   [facility] [name], Priscille Heidelberg, MD

## 2021-10-13 NOTE — Telephone Encounter (Signed)
Requested Prescriptions  Pending Prescriptions Disp Refills  . fluticasone-salmeterol (ADVAIR DISKUS) 250-50 MCG/ACT AEPB 60 each 3    Sig: Inhale 1 puff into the lungs 2 (two) times daily.     Pulmonology:  Combination Products Passed - 10/12/2021  8:13 AM      Passed - Valid encounter within last 12 months    Recent Outpatient Visits          2 months ago Controlled type 2 diabetes mellitus without complication, without long-term current use of insulin (HCC)   [facility] Medicine [name], Priscille Heidelberg, MD   9 months ago Testosterone deficiency   [facility] Medicine [name], Priscille Heidelberg, MD   11 months ago Uncontrolled type 2 diabetes mellitus with hyperglycemia (HCC)   [facility] Medicine Donita Brooks, MD   1 year ago Uncontrolled type 2 diabetes mellitus with hyperglycemia (HCC)   [facility] Medicine Donita Brooks, MD   1 year ago Chronic fatigue   [facility] [name], Priscille Heidelberg, MD             . traMADol (ULTRAM) 50 MG tablet 90 tablet     Sig: Take 1 tablet (50 mg total) by mouth every 6 (six) hours as needed. for pain     Not Delegated - Analgesics:  Opioid Agonists Failed - 10/12/2021  8:13 AM      Failed - This refill cannot be delegated      Failed - Urine Drug Screen completed in last 360 days      Passed - Valid encounter within last 3 months    Recent Outpatient Visits          2 months ago Controlled type 2 diabetes mellitus without complication, without long-term current use of insulin (HCC)   [facility] Medicine [name], Priscille Heidelberg, MD   9 months ago Testosterone deficiency   [facility] Medicine [name], Priscille Heidelberg, MD   11 months ago Uncontrolled type 2 diabetes mellitus with hyperglycemia (HCC)   [facility] Medicine Donita Brooks, MD   1 year ago Uncontrolled type 2 diabetes mellitus with hyperglycemia (HCC)   [facility] [name], Priscille Heidelberg, MD   1 year ago  Chronic fatigue   [facility] [name], Priscille Heidelberg, MD

## 2021-10-13 NOTE — Telephone Encounter (Signed)
Requested medication (s) are due for refill today: yes  Requested medication (s) are on the active medication list: yes    Last refill: 09/10/21  #90  0 refills  Future visit scheduled no  Notes to clinic:Not delegated, please review. Thank you.  Requested Prescriptions  Pending Prescriptions Disp Refills   traMADol (ULTRAM) 50 MG tablet 90 tablet 0    Sig: Take 1 tablet (50 mg total) by mouth every 6 (six) hours as needed. for pain     Not Delegated - Analgesics:  Opioid Agonists Failed - 10/13/2021 12:49 PM      Failed - This refill cannot be delegated      Failed - Urine Drug Screen completed in last 360 days      Passed - Valid encounter within last 3 months    Recent Outpatient Visits           2 months ago Controlled type 2 diabetes mellitus without complication, without long-term current use of insulin (HCC)   [facility] Medicine [name], Priscille Heidelberg, MD   9 months ago Testosterone deficiency   [facility] [name], Priscille Heidelberg, MD   11 months ago Uncontrolled type 2 diabetes mellitus with hyperglycemia (HCC)   [facility] Medicine Donita Brooks, MD   1 year ago Uncontrolled type 2 diabetes mellitus with hyperglycemia (HCC)   [facility] [name], Priscille Heidelberg, MD   1 year ago Chronic fatigue   [facility] [name], Priscille Heidelberg, MD

## 2021-10-14 NOTE — Telephone Encounter (Signed)
Last ov 08/10/2021

## 2021-10-15 ENCOUNTER — Other Ambulatory Visit: Payer: Self-pay

## 2021-10-16 ENCOUNTER — Other Ambulatory Visit: Payer: Self-pay

## 2021-10-18 ENCOUNTER — Other Ambulatory Visit: Payer: Self-pay

## 2021-10-18 MED FILL — Tramadol HCl Tab 50 MG: ORAL | 23 days supply | Qty: 90 | Fill #0 | Status: AC

## 2021-10-19 ENCOUNTER — Other Ambulatory Visit: Payer: Self-pay

## 2021-10-19 MED FILL — Dulaglutide Soln Auto-injector 1.5 MG/0.5ML: SUBCUTANEOUS | 28 days supply | Qty: 2 | Fill #10 | Status: AC

## 2021-10-20 ENCOUNTER — Other Ambulatory Visit: Payer: Self-pay

## 2021-10-26 ENCOUNTER — Ambulatory Visit: Payer: BC Managed Care – PPO | Admitting: Family Medicine

## 2021-10-26 VITALS — BP 128/78 | HR 81 | Temp 98.1°F | Ht 73.0 in | Wt 220.0 lb

## 2021-10-26 DIAGNOSIS — R5383 Other fatigue: Secondary | ICD-10-CM | POA: Diagnosis not present

## 2021-10-26 DIAGNOSIS — E349 Endocrine disorder, unspecified: Secondary | ICD-10-CM | POA: Diagnosis not present

## 2021-10-26 DIAGNOSIS — L989 Disorder of the skin and subcutaneous tissue, unspecified: Secondary | ICD-10-CM

## 2021-10-26 DIAGNOSIS — E119 Type 2 diabetes mellitus without complications: Secondary | ICD-10-CM

## 2021-10-26 NOTE — Progress Notes (Signed)
Subjective:    Patient ID: Kyle Campbell, male    DOB: 13-Dec-1960, 61 y.o.   MRN: [id number]  Patient presents today complaining of feeling weak and tired.  He states that he has no stamina.  He reports no energy.  He states that he will start to do things but have to stop and take a break because he just gives out.  He denies any shortness of breath.  He denies any chest pain.  He has more weakness.  He also reports feeling tremulous.  He states that at times his arms were to start shaking.  He gives the example of going to a buffet and having a hard time holding the plate because his arms were shaking.  He feels weak like his blood sugar could be dropping.  Since I last saw him he quit metformin but he is still on Trulicity.  He is not checking his blood sugars.  He denies any tachycardia or racing heartbeats or pleurisy or hemoptysis.  He denies any fevers or chills.  He denies any melena or hematochezia.  He is overdue for colonoscopy.  He has a history of hypergonadism but he has not been on treatment.  His blood pressures have been excellent.  He does have a lesion on the dorsum of his left forearm.  It is 1.5 cm in diameter.  It is a red papular plaque with vesicles in the center and irregular border.  He states has been there for 3 months. Past Medical History:  Diagnosis Date   Asthma    Diabetes mellitus without complication (HCC)    Erectile dysfunction    GERD (gastroesophageal reflux disease)    Hyperlipidemia    Obstructive sleep apnea    compliant with CPAP   Testosterone deficiency    Past Surgical History:  Procedure Laterality Date   TONSILLECTOMY     age 89   Current Outpatient Medications on File Prior to Visit  Medication Sig Dispense Refill   albuterol (VENTOLIN HFA) 108 (90 Base) MCG/ACT inhaler INHALE 1 PUFF BY MOUTH EVERY FOUR HOURS AS NEEDED 18 g 3   Dulaglutide (TRULICITY) 1.5 VV/6.1QA SOPN INJECT 1.5MG SUBCUTANEOUSLY ONCE A WEEK 2 mL 11   FLOWFLEX COVID-19 AG  HOME TEST KIT FOLLOW THE test instructions FOR using THE at-home covid test.     fluticasone-salmeterol (ADVAIR DISKUS) 250-50 MCG/ACT AEPB Inhale 1 puff into the lungs 2 (two) times daily. 60 each 3   glucose blood (ACCU-CHEK AVIVA PLUS) test strip Use as instructed 100 each 12   Insulin Pen Needle 29G X 12MM MISC 1 Dose by Does not apply route every 14 (fourteen) days. 30 each 3   Lancets (ACCU-CHEK MULTICLIX) lancets Use as instructed 100 each 12   sildenafil (VIAGRA) 100 MG tablet Take 0.5-1 tablets (50-100 mg total) by mouth daily as needed for erectile dysfunction. 5 tablet 11   traMADol (ULTRAM) 50 MG tablet Take 1 tablet (50 mg total) by mouth every 6 (six) hours as needed. for pain 90 tablet 0   UNABLE TO FIND CPAP mask - DX: OSA G47.33 1 Device 0   albuterol (PROVENTIL) (2.5 MG/3ML) 0.083% nebulizer solution USE ONE VIAL VIA NEBULIZER EVERY 6 HOURS AS NEEDED FOR WHEEZING OR SHORTNESS OF BREATH. 150 mL 1   No current facility-administered medications on file prior to visit.       No Known Allergies Social History   Socioeconomic History   Marital status: Married    Spouse name:  Not on file   Number of children: Not on file   Years of education: Not on file   Highest education level: Not on file  Occupational History   Not on file  Tobacco Use   Smoking status: Never   Smokeless tobacco: Never  Substance and Sexual Activity   Alcohol use: Yes    Alcohol/week: 6.0 standard drinks of alcohol    Types: 1 Standard drinks or equivalent, 5 Shots of liquor per week    Comment: weekend only   Drug use: No   Sexual activity: Yes    Comment: Married.    Other Topics Concern   Not on file  Social History Narrative   Has 3 children. One 81 year, and 61 year old twins. Works as Administrator. Occasional  ETOH.    Social Determinants of Health   Financial Resource Strain: Not on file  Food Insecurity: Not on file  Transportation Needs: Not on file  Physical Activity: Not on  file  Stress: Not on file  Social Connections: Not on file  Intimate Partner Violence: Not on file      Review of Systems  All other systems reviewed and are negative.      Objective:   Physical Exam Constitutional:      General: He is not in acute distress.    Appearance: He is well-developed. He is not diaphoretic.  Eyes:     Conjunctiva/sclera: Conjunctivae normal.     Pupils: Pupils are equal, round, and reactive to light.  Neck:     Thyroid: No thyromegaly.     Vascular: No JVD.  Cardiovascular:     Rate and Rhythm: Normal rate and regular rhythm.     Heart sounds: Normal heart sounds. No murmur heard.    No friction rub. No gallop.  Pulmonary:     Effort: Pulmonary effort is normal. No respiratory distress.     Breath sounds: Decreased air movement present. Wheezing present. No rhonchi or rales.  Abdominal:     General: Bowel sounds are normal. There is no distension.     Palpations: Abdomen is soft.     Tenderness: There is no abdominal tenderness. There is no rebound.  Genitourinary:    Prostate: Normal.     Rectum: Normal.  Musculoskeletal:     Cervical back: Neck supple.  Lymphadenopathy:     Cervical: No cervical adenopathy.  Neurological:     Mental Status: He is alert and oriented to person, place, and time.     Cranial Nerves: No cranial nerve deficit.     Motor: No abnormal muscle tone.     Coordination: Coordination normal.     Deep Tendon Reflexes: Reflexes are normal and symmetric.           Assessment & Plan:  Fatigue, unspecified type - Plan: CBC with Differential/Platelet, COMPLETE METABOLIC PANEL WITH GFR, TSH, Hemoglobin A1c, Sedimentation rate, Testosterone Total,Free,Bio, Males  Controlled type 2 diabetes mellitus without complication, without long-term current use of insulin ([place]) - Plan: CBC with Differential/Platelet, COMPLETE METABOLIC PANEL WITH GFR, TSH, Hemoglobin A1c, Sedimentation rate, Testosterone Total,Free,Bio,  Males  Testosterone deficiency - Plan: CBC with Differential/Platelet, COMPLETE METABOLIC PANEL WITH GFR, TSH, Hemoglobin A1c, Sedimentation rate, Testosterone Total,Free,Bio, Males  Skin lesion of left arm - Plan: Pathology Report ([facility])  First I am concerned that the patient may be experiencing hypoglycemia.  I recommended that he stop the Trulicity.  I will repeat an A1c.  If dropping lower this  could certainly be a potential cause of his fatigue. Concerned about hypothyroidism versus hypothyroidism.  Check a TSH.  It could certainly be due to testosterone deficiency so I will repeat a testosterone level.  Check a CMP to evaluate for any liver or kidney dysfunction as well as to screen for potential adrenal insufficiency by looking for hyponatremia or hyperkalemia.  Check a CBC to evaluate for anemia.  I do not feel that this is cardiac given the fact he is having no chest pain or shortness of breath.  If labs are normal, would recommend a colonoscopy given that that is due.  Also consider neuromuscular conditions given the tremor in his hands and the weakness.  Performed a shave biopsy of the lesion on the dorsum of his left forearm.  Anesthetized the lesion with 0.1% lidocaine.  Performed shave biopsy using sterile technique that was sent to pathology in a  labeled container.

## 2021-10-27 ENCOUNTER — Other Ambulatory Visit: Payer: Self-pay | Admitting: Family Medicine

## 2021-10-27 DIAGNOSIS — R7989 Other specified abnormal findings of blood chemistry: Secondary | ICD-10-CM

## 2021-10-27 LAB — CBC WITH DIFFERENTIAL/PLATELET
Absolute Monocytes: 680 cells/uL (ref 200–950)
Basophils Absolute: 42 cells/uL (ref 0–200)
Basophils Relative: 0.5 %
Eosinophils Absolute: 529 cells/uL — ABNORMAL HIGH (ref 15–500)
Eosinophils Relative: 6.3 %
HCT: 43.3 % (ref 38.5–50.0)
Hemoglobin: 14.9 g/dL (ref 13.2–17.1)
Lymphs Abs: 2839 cells/uL (ref 850–3900)
MCH: 30.8 pg (ref 27.0–33.0)
MCHC: 34.4 g/dL (ref 32.0–36.0)
MCV: 89.5 fL (ref 80.0–100.0)
MPV: 10.6 fL (ref 7.5–12.5)
Monocytes Relative: 8.1 %
Neutro Abs: 4309 cells/uL (ref 1500–7800)
Neutrophils Relative %: 51.3 %
Platelets: 204 10*3/uL (ref 140–400)
RBC: 4.84 10*6/uL (ref 4.20–5.80)
RDW: 12.5 % (ref 11.0–15.0)
Total Lymphocyte: 33.8 %
WBC: 8.4 10*3/uL (ref 3.8–10.8)

## 2021-10-27 LAB — COMPLETE METABOLIC PANEL WITH GFR
AG Ratio: 2.1 (calc) (ref 1.0–2.5)
ALT: 22 U/L (ref 9–46)
AST: 14 U/L (ref 10–35)
Albumin: 4.2 g/dL (ref 3.6–5.1)
Alkaline phosphatase (APISO): 85 U/L (ref 35–144)
BUN/Creatinine Ratio: 11 (calc) (ref 6–22)
BUN: 16 mg/dL (ref 7–25)
CO2: 29 mmol/L (ref 20–32)
Calcium: 9.4 mg/dL (ref 8.6–10.3)
Chloride: 102 mmol/L (ref 98–110)
Creat: 1.49 mg/dL — ABNORMAL HIGH (ref 0.70–1.30)
Globulin: 2 g/dL (calc) (ref 1.9–3.7)
Glucose, Bld: 100 mg/dL — ABNORMAL HIGH (ref 65–99)
Potassium: 4.5 mmol/L (ref 3.5–5.3)
Sodium: 140 mmol/L (ref 135–146)
Total Bilirubin: 0.3 mg/dL (ref 0.2–1.2)
Total Protein: 6.2 g/dL (ref 6.1–8.1)
eGFR: 54 mL/min/{1.73_m2} — ABNORMAL LOW (ref >=60)

## 2021-10-27 LAB — TESTOSTERONE TOTAL,FREE,BIO, MALES
Albumin: 4.2 g/dL (ref 3.6–5.1)
Sex Hormone Binding: 41 nmol/L (ref 22–77)
Testosterone, Bioavailable: 52.3 ng/dL — ABNORMAL LOW (ref 110.0–575.0)
Testosterone, Free: 27.2 pg/mL — ABNORMAL LOW (ref 46.0–224.0)
Testosterone: 256 ng/dL (ref 250–827)

## 2021-10-27 LAB — HEMOGLOBIN A1C
Hgb A1c MFr Bld: 6.1 % of total Hgb — ABNORMAL HIGH (ref <5.7)
Mean Plasma Glucose: 128 mg/dL
eAG (mmol/L): 7.1 mmol/L

## 2021-10-27 LAB — SEDIMENTATION RATE: Sed Rate: 2 mm/h (ref 0–20)

## 2021-10-27 LAB — TSH: TSH: <0.01 mIU/L — ABNORMAL LOW (ref 0.40–4.50)

## 2021-10-28 LAB — TISSUE SPECIMEN

## 2021-10-28 LAB — PATHOLOGY REPORT

## 2021-11-02 ENCOUNTER — Other Ambulatory Visit: Payer: Self-pay

## 2021-11-02 MED FILL — Albuterol Sulfate Inhal Aero 108 MCG/ACT (90MCG Base Equiv): RESPIRATORY_TRACT | 30 days supply | Qty: 18 | Fill #3 | Status: AC

## 2021-11-03 ENCOUNTER — Other Ambulatory Visit: Payer: BC Managed Care – PPO

## 2021-11-03 DIAGNOSIS — R7989 Other specified abnormal findings of blood chemistry: Secondary | ICD-10-CM

## 2021-11-06 ENCOUNTER — Other Ambulatory Visit: Payer: Self-pay | Admitting: Family Medicine

## 2021-11-06 DIAGNOSIS — E0590 Thyrotoxicosis, unspecified without thyrotoxic crisis or storm: Secondary | ICD-10-CM

## 2021-11-06 LAB — T3, FREE: T3, Free: 7.3 pg/mL — ABNORMAL HIGH (ref 2.3–4.2)

## 2021-11-06 LAB — THYROID STIMULATING IMMUNOGLOBULIN: TSI: 298 % baseline — ABNORMAL HIGH (ref <140)

## 2021-11-06 LAB — THYROID PEROXIDASE ANTIBODIES (TPO) (REFL): Thyroperoxidase Ab SerPl-aCnc: <1 IU/mL (ref <9)

## 2021-11-06 LAB — T4, FREE: Free T4: 1.6 ng/dL (ref 0.8–1.8)

## 2021-11-17 ENCOUNTER — Other Ambulatory Visit: Payer: Self-pay

## 2021-11-17 ENCOUNTER — Other Ambulatory Visit: Payer: Self-pay | Admitting: Family Medicine

## 2021-11-18 NOTE — Telephone Encounter (Signed)
Requested medication (s) are due for refill today: yes  Requested medication (s) are on the active medication list: yes    Last refill: 10/18/21 #90  0 refills  Future visit scheduled no  Notes to clinic:Not delegated. Please review  Requested Prescriptions  Pending Prescriptions Disp Refills   traMADol (ULTRAM) 50 MG tablet 90 tablet 0    Sig: Take 1 tablet (50 mg total) by mouth every 6 (six) hours as needed. for pain     Not Delegated - Analgesics:  Opioid Agonists Failed - 11/17/2021  7:55 AM      Failed - This refill cannot be delegated      Failed - Urine Drug Screen completed in last 360 days      Failed - Valid encounter within last 3 months    Recent Outpatient Visits           3 months ago Controlled type 2 diabetes mellitus without complication, without long-term current use of insulin (HCC)   [facility] [name], Priscille Heidelberg, MD   10 months ago Testosterone deficiency   [facility] Medicine Donita Brooks, MD   1 year ago Uncontrolled type 2 diabetes mellitus with hyperglycemia (HCC)   [facility] Medicine Donita Brooks, MD   1 year ago Uncontrolled type 2 diabetes mellitus with hyperglycemia (HCC)   [facility] [name], Priscille Heidelberg, MD   1 year ago Chronic fatigue   [facility] [name], Priscille Heidelberg, MD

## 2021-11-19 ENCOUNTER — Other Ambulatory Visit: Payer: Self-pay

## 2021-11-20 ENCOUNTER — Other Ambulatory Visit: Payer: Self-pay

## 2021-11-20 MED FILL — Tramadol HCl Tab 50 MG: ORAL | 23 days supply | Qty: 90 | Fill #0 | Status: AC

## 2021-11-20 NOTE — Telephone Encounter (Signed)
Pt called in to inquire about refill of Rx #: [id number]  traMADol (ULTRAM) 50 MG tablet [[id number]]   LOV: 11/03/21

## 2021-12-14 ENCOUNTER — Other Ambulatory Visit: Payer: Self-pay

## 2021-12-16 ENCOUNTER — Encounter: Payer: Self-pay | Admitting: Nurse Practitioner

## 2021-12-16 ENCOUNTER — Ambulatory Visit: Payer: BC Managed Care – PPO | Admitting: Nurse Practitioner

## 2021-12-16 VITALS — BP 126/70 | HR 90 | Ht 73.0 in | Wt 219.0 lb

## 2021-12-16 DIAGNOSIS — E0590 Thyrotoxicosis, unspecified without thyrotoxic crisis or storm: Secondary | ICD-10-CM | POA: Diagnosis not present

## 2021-12-16 DIAGNOSIS — R768 Other specified abnormal immunological findings in serum: Secondary | ICD-10-CM

## 2021-12-16 NOTE — Progress Notes (Signed)
12/16/2021     Endocrinology Consult Note    Subjective:    Patient ID: Kyle Campbell, male    DOB: 14-Nov-1960, PCP Susy Frizzle, MD.   Past Medical History:  Diagnosis Date   Asthma    Diabetes mellitus without complication ([place])    Erectile dysfunction    GERD (gastroesophageal reflux disease)    Hyperlipidemia    Obstructive sleep apnea    compliant with CPAP   Testosterone deficiency     Past Surgical History:  Procedure Laterality Date   TONSILLECTOMY     age 77    Social History   Socioeconomic History   Marital status: Married    Spouse name: Not on file   Number of children: Not on file   Years of education: Not on file   Highest education level: Not on file  Occupational History   Not on file  Tobacco Use   Smoking status: Never   Smokeless tobacco: Never  Vaping Use   Vaping Use: Never used  Substance and Sexual Activity   Alcohol use: Yes    Alcohol/week: 6.0 standard drinks of alcohol    Types: 1 Standard drinks or equivalent, 5 Shots of liquor per week    Comment: weekend only   Drug use: No   Sexual activity: Yes    Comment: Married.    Other Topics Concern   Not on file  Social History Narrative   Has 3 children. One 28 year, and 58 year old twins. Works as Administrator. Occasional  ETOH.    Social Determinants of Health   Financial Resource Strain: Not on file  Food Insecurity: Not on file  Transportation Needs: Not on file  Physical Activity: Not on file  Stress: Not on file  Social Connections: Not on file    Family History  Problem Relation Age of Onset   Lung cancer Father     Outpatient Encounter Medications as of 12/16/2021  Medication Sig   albuterol (PROVENTIL) (2.5 MG/3ML) 0.083% nebulizer solution USE ONE VIAL VIA NEBULIZER EVERY 6 HOURS AS NEEDED FOR WHEEZING OR SHORTNESS OF BREATH.   albuterol (VENTOLIN HFA) 108 (90 Base) MCG/ACT inhaler INHALE 1 PUFF BY MOUTH EVERY FOUR HOURS AS NEEDED    fluticasone-salmeterol (ADVAIR DISKUS) 250-50 MCG/ACT AEPB Inhale 1 puff into the lungs 2 (two) times daily.   glucose blood (ACCU-CHEK AVIVA PLUS) test strip Use as instructed   Insulin Pen Needle 29G X 12MM MISC 1 Dose by Does not apply route every 14 (fourteen) days.   Lancets (ACCU-CHEK MULTICLIX) lancets Use as instructed   sildenafil (VIAGRA) 100 MG tablet Take 0.5-1 tablets (50-100 mg total) by mouth daily as needed for erectile dysfunction.   traMADol (ULTRAM) 50 MG tablet Take 1 tablet (50 mg total) by mouth every 6 (six) hours as needed. for pain (Patient not taking: Reported on 12/16/2021)   UNABLE TO FIND CPAP mask - DX: OSA G47.33   [DISCONTINUED] Dulaglutide (TRULICITY) 1.5 KA/7.6OT SOPN INJECT 1.5MG SUBCUTANEOUSLY ONCE A WEEK   [DISCONTINUED] FLOWFLEX COVID-19 AG HOME TEST KIT FOLLOW THE test instructions FOR using THE at-home covid test.   No facility-administered encounter medications on file as of 12/16/2021.    ALLERGIES: No Known Allergies  VACCINATION STATUS: Immunization History  Administered Date(s) Administered   Influenza,inj,Quad PF,6+ Mos 04/08/2015, 01/19/2016, 04/21/2017, 03/01/2019   Influenza-Unspecified 05/20/2003   Td 04/26/2002   Tdap 02/02/2013     HPI  Kyle Campbell  is 61 y.o. male who presents today with a medical history as above. he is being seen in consultation for hyperthyroidism requested by Susy Frizzle, MD.  he has been dealing with symptoms of unexplained weight loss, tremors, palpitations, heat intolerance, weakness, fatigue, irritability, dry eyes, dizziness, and diarrhea for about 6 months now. These symptoms are progressively worsening and troubling to him.  his most recent thyroid labs revealed suppressed TSH of < 0.01 and elevated FT4 of 1.6, elevated FT3 of 7.3 and positive thyroid stimulating immunoglobulin on 6/27.  he denies dysphagia, choking, shortness of breath, no recent voice change.    he denies family history of thyroid  dysfunction and denies family hx of thyroid cancer. He does not know his family history on his fathers side.  he denies personal history of goiter. he is not on any anti-thyroid medications nor on any thyroid hormone supplements. Denies use of Biotin containing supplements.  he is willing to proceed with appropriate work up and therapy for thyrotoxicosis.  He has never had any imaging of his thyroid in the past.   Review of systems  Constitutional: + decreasing body weight, current Body mass index is 28.89 kg/m., + fatigue, + subjective hyperthermia, no subjective hypothermia Eyes: + blurry vision, + xerophthalmia ENT: no sore throat, no nodules palpated in throat, no dysphagia/odynophagia, no hoarseness Cardiovascular: no chest pain, no shortness of breath, + palpitations, no leg swelling, easily bruised Respiratory: no cough, no shortness of breath Gastrointestinal: no nausea/vomiting, + diarrhea Musculoskeletal: no muscle/joint aches Skin: no rashes, no hyperemia Neurological: + tremors, no numbness, no tingling, + dizziness Psychiatric: no depression, no anxiety, + irritability   Objective:    BP 126/70   Pulse 90   Ht 6' 1"  (1.854 m)   Wt 219 lb (99.3 kg)   BMI 28.89 kg/m   Wt Readings from Last 3 Encounters:  12/16/21 219 lb (99.3 kg)  10/26/21 220 lb (99.8 kg)  08/10/21 224 lb 3.2 oz (101.7 kg)     BP Readings from Last 3 Encounters:  12/16/21 126/70  10/26/21 128/78  08/10/21 122/72                        Physical Exam- Limited  Constitutional:  Body mass index is 28.89 kg/m. , not in acute distress, normal state of mind Eyes:  EOMI, no exophthalmos Neck: Supple Thyroid: No gross goiter Cardiovascular: RRR, no murmurs, rubs, or gallops, no edema Respiratory: Adequate breathing efforts, no crackles, rales, rhonchi, or wheezing Musculoskeletal: no gross deformities, strength intact in all four extremities, no gross restriction of joint movements Skin:  no  rashes, no hyperemia Neurological: ++ tremor with outstretched hands, DTR hyperactive BLE   CMP     Component Value Date/Time   NA 140 10/26/2021 1644   K 4.5 10/26/2021 1644   CL 102 10/26/2021 1644   CO2 29 10/26/2021 1644   GLUCOSE 100 (H) 10/26/2021 1644   BUN 16 10/26/2021 1644   CREATININE 1.49 (H) 10/26/2021 1644   CALCIUM 9.4 10/26/2021 1644   PROT 6.2 10/26/2021 1644   ALBUMIN 4.5 09/20/2016 1018   AST 14 10/26/2021 1644   ALT 22 10/26/2021 1644   ALKPHOS 72 09/20/2016 1018   BILITOT 0.3 10/26/2021 1644   GFRNONAA 73 09/19/2020 1622   GFRAA 84 09/19/2020 1622     CBC    Component Value Date/Time   WBC 8.4 10/26/2021 1644   RBC 4.84 10/26/2021 1644  HGB 14.9 10/26/2021 1644   HCT 43.3 10/26/2021 1644   PLT 204 10/26/2021 1644   MCV 89.5 10/26/2021 1644   MCH 30.8 10/26/2021 1644   MCHC 34.4 10/26/2021 1644   RDW 12.5 10/26/2021 1644   LYMPHSABS 2,839 10/26/2021 1644   MONOABS 380 09/20/2016 1018   EOSABS 529 (H) 10/26/2021 1644   BASOSABS 42 10/26/2021 1644     Diabetic Labs (most recent): Lab Results  Component Value Date   HGBA1C 6.1 (H) 10/26/2021   HGBA1C 5.9 (H) 08/03/2021   HGBA1C 6.0 (H) 01/05/2021   MICROALBUR <0.2 01/05/2021   MICROALBUR 0.2 04/18/2020   MICROALBUR <0.2 04/15/2017    Lipid Panel     Component Value Date/Time   CHOL 167 01/05/2021 0820   TRIG 258 (H) 01/05/2021 0820   HDL 38 (L) 01/05/2021 0820   CHOLHDL 4.4 01/05/2021 0820   VLDL 39 (H) 09/20/2016 1018   LDLCALC 95 01/05/2021 0820     Lab Results  Component Value Date   TSH <0.01 (L) 10/26/2021   TSH 1.33 09/19/2020   TSH 1.36 01/13/2016   TSH 1.336 09/06/2014   TSH 1.570 10/17/2008   FREET4 1.6 11/03/2021   FREET4 5.3 10/17/2008      Latest Reference Range & Units 09/06/14 08:27 01/13/16 09:02 09/19/20 16:22 10/26/21 16:44 11/03/21 08:47  TSH 0.40 - 4.50 mIU/L 1.336 1.36 1.33 <0.01 (L)   Triiodothyronine,Free,Serum 2.3 - 4.2 pg/mL     7.3 (H)   T4,Free(Direct) 0.8 - 1.8 ng/dL     1.6  Thyroperoxidase Ab SerPl-aCnc <9 IU/mL     <1  THYROID STIMULATING IMMUNOGLOBULIN      Rpt !  (L): Data is abnormally low (H): Data is abnormally high !: Data is abnormal Rpt: View report in Results Review for more information   Assessment & Plan:   1. Hyperthyroidism- suspect R/t Graves disease 2. Thyroid antibody positive  he is being seen at a kind request of Susy Frizzle, MD.  his history and most recent labs are reviewed, and he was examined clinically. Subjective and objective findings are consistent with thyrotoxicosis likely from primary hyperthyroidism. The potential risks of untreated thyrotoxicosis and the need for definitive therapy have been discussed in detail with him, and he agrees to proceed with diagnostic workup and treatment plan.   I will repeat full profile thyroid function tests today and confirmatory thyroid uptake and scan will be scheduled to be done as soon as possible.  Will call patient today and let him know if we need to treat with Methimazole first.   Options of therapy are discussed with him.  We discussed the option of treating it with medications including methimazole or PTU which may have side effects including rash, transaminitis, and bone marrow suppression.  We also discussed the option of definitive therapy with RAI ablation of the thyroid. If he is found to have primary hyperthyroidism from Graves' disease , toxic multinodular goiter or toxic nodular goiter the preferred modality of treatment would be I-131 thyroid ablation. Surgery is another choice of treatment in some cases, in his case surgery is not a good fit for presentation with only mild goiter.  -Patient is made aware of the high likelihood of post ablative hypothyroidism with subsequent need for lifelong thyroid hormone replacement. heunderstands this outcome and he is  willing to proceed.      he will return in 3 weeks for treatment  decision.  I did not initiate any new prescriptions at  today's visit.    -Patient is advised to maintain close follow up with Susy Frizzle, MD for primary care needs.   - Time spent with the patient: 60 minutes, of which >50% was spent in obtaining information about his symptoms, reviewing his previous labs, evaluations, and treatments, counseling him about his hyperthyroidism , and developing a plan to confirm the diagnosis and long term treatment as necessary. Please refer to "Patient Self Inventory" in the Media tab for reviewed elements of pertinent patient history.  Kyle Campbell participated in the discussions, expressed understanding, and voiced agreement with the above plans.  All questions were answered to his satisfaction. he is encouraged to contact clinic should he have any questions or concerns prior to his return visit.   Follow up plan: Return in about 3 weeks (around 01/06/2022) for Thyroid follow up, Previsit labs; uptake and scan.   Thank you for involving me in the care of this pleasant patient, and I will continue to update you with his progress.    Rayetta Pigg, [facility] [address] [place], [place] [address] Phone: [phone] Fax: [phone]  12/16/2021, 3:20 PM

## 2021-12-16 NOTE — Patient Instructions (Signed)
Hyperthyroidism Hyperthyroidism refers to a thyroid gland that is too active or overactive. The thyroid gland is a small gland located in the lower front part of the neck, just in front of the windpipe (trachea). This gland makes hormones that: Help control how the body uses food for energy (metabolism). Help the heart and brain work well. Keep your bones strong. When the thyroid is overactive, it produces too much of a hormone called thyroxine. What are the causes? This condition may be caused by: Graves' disease. This is a disorder in which the body's disease-fighting system (immune system) attacks the thyroid gland. This is the most common cause. Inflammation of the thyroid gland. A tumor in the thyroid gland. Use of certain medicines, including: Prescription thyroid hormone replacement. Herbal supplements that mimic thyroid hormones. Amiodarone therapy. Solid or fluid-filled lumps within your thyroid gland (thyroid nodules). Taking in a large amount of iodine from foods or medicines. What increases the risk? You are more likely to develop this condition if: You are male. You have a family history of thyroid conditions. You smoke tobacco. You use a medicine called lithium. You take medicines that affect the immune system (immunosuppressants). What are the signs or symptoms? Symptoms of this condition include: Nervousness. Inability to tolerate heat. Diarrhea. Rapid heart rate. Shaky hands. Restlessness. Sleep problems. Other symptoms may include: Heart skipping beats or making extra beats. Unexplained weight loss. Change in the texture of hair or skin. Loss of menstruation. Fatigue. Enlarged thyroid gland or a lump in the thyroid (nodule). You may also have symptoms of Graves' disease, which may include: Protruding eyes. Dry eyes. Red or swollen eyes. Problems with vision. How is this diagnosed? This condition may be diagnosed based on: Your symptoms and medical  history. A physical exam. Blood tests. Thyroid ultrasound. This test involves using sound waves to produce images of the thyroid gland. A thyroid scan. A radioactive substance is injected into a vein, and images show how much iodine is present in the thyroid. Radioactive iodine uptake test (RAIU). A small amount of radioactive iodine is given by mouth to see how much iodine the thyroid absorbs after a certain amount of time. How is this treated? Treatment depends on the cause and severity of the condition. Treatment may include: Medicines to reduce the amount of thyroid hormone your body makes. Medicines to help manage your symptoms. Radioactive iodine treatment (radioiodine therapy). This involves swallowing a small dose of radioactive iodine, in capsule or liquid form, to kill thyroid cells. Surgery to remove part or all of your thyroid gland. You may need to take thyroid hormone replacement medicine for the rest of your life after thyroid surgery. Follow these instructions at home:  Take over-the-counter and prescription medicines only as told by your health care provider. Do not use any products that contain nicotine or tobacco. These products include cigarettes, chewing tobacco, and vaping devices, such as e-cigarettes. If you need help quitting, ask your health care provider. Follow any instructions from your health care provider about diet. You may be instructed to limit foods that contain iodine. Keep all follow-up visits. You will need to have blood tests regularly so that your health care provider can monitor your condition. Where to find more information National Institute of Diabetes and Digestive and Kidney Diseases: niddk.nih.gov Contact a health care provider if: Your symptoms do not get better with treatment. You have a fever. You have abdominal pain. You feel dizzy. You are taking thyroid hormone replacement medicine and: You have   symptoms of depression. You feel like you  are tired all the time. You gain weight. Get help right away if: You have sudden, unexplained confusion or other mental changes. You have chest pain. You have fast or irregular heartbeats (palpitations). You have difficulty breathing. These symptoms may be an emergency. Get help right away. Call 911. Do not wait to see if the symptoms will go away. Do not drive yourself to the hospital. Summary The thyroid gland is a small gland located in the lower front part of the neck, just in front of the windpipe. Hyperthyroidism is when the thyroid gland is too active and produces too much of a hormone called thyroxine. The most common cause is Graves' disease, a disorder in which your immune system attacks the thyroid gland. Hyperthyroidism can cause various symptoms, such as unexplained weight loss, nervousness, inability to tolerate heat, or changes in your heartbeat. Treatment may include medicine to reduce the amount of thyroid hormone your body makes, radioiodine therapy, surgery, or medicines to manage symptoms. This information is not intended to replace advice given to you by your health care provider. Make sure you discuss any questions you have with your health care provider. Document Revised: 06/19/2021 Document Reviewed: 06/19/2021 Elsevier Patient Education  2023 Elsevier Inc.  

## 2021-12-17 ENCOUNTER — Other Ambulatory Visit: Payer: Self-pay | Admitting: Nurse Practitioner

## 2021-12-17 ENCOUNTER — Other Ambulatory Visit: Payer: Self-pay

## 2021-12-17 DIAGNOSIS — E0590 Thyrotoxicosis, unspecified without thyrotoxic crisis or storm: Secondary | ICD-10-CM

## 2021-12-17 DIAGNOSIS — R768 Other specified abnormal immunological findings in serum: Secondary | ICD-10-CM

## 2021-12-17 LAB — TSH: TSH: <0.005 u[IU]/mL — ABNORMAL LOW (ref 0.450–4.500)

## 2021-12-17 LAB — T4, FREE: Free T4: 2.73 ng/dL — ABNORMAL HIGH (ref 0.82–1.77)

## 2021-12-17 LAB — T3, FREE: T3, Free: 9.8 pg/mL — ABNORMAL HIGH (ref 2.0–4.4)

## 2021-12-17 MED ORDER — METHIMAZOLE 10 MG PO TABS
10.0000 mg | ORAL_TABLET | Freq: Two times a day (BID) | ORAL | 1 refills | Status: DC
  Filled 2021-12-17: qty 180, 90d supply, fill #0

## 2021-12-17 MED ORDER — PROPRANOLOL HCL 20 MG PO TABS
20.0000 mg | ORAL_TABLET | Freq: Two times a day (BID) | ORAL | 1 refills | Status: DC
  Filled 2021-12-17: qty 180, 90d supply, fill #0
  Filled 2022-04-12: qty 80, 40d supply, fill #1
  Filled 2022-04-12: qty 100, 50d supply, fill #1
  Filled 2022-04-12: qty 180, 90d supply, fill #1

## 2021-12-17 NOTE — Progress Notes (Signed)
Please call patient and let him know that his thyroid function is significantly worse, meaning we need to put him on Methimazole to slow things down.  This means we will also need to put off the uptake and scan for a bit.  I will send in a prescription for Methimazole 10 mg po twice daily and Propanolol 20 mg po twice daily (a beta-blocker which can help manage his symptoms).  Will need to repeat thyroid function tests (TSH, FT4, FT3) in about 6 weeks.  Then we can further decide if he is ready for the uptake and scan.

## 2021-12-18 ENCOUNTER — Other Ambulatory Visit: Payer: Self-pay

## 2021-12-21 ENCOUNTER — Other Ambulatory Visit: Payer: Self-pay | Admitting: Family Medicine

## 2021-12-21 ENCOUNTER — Other Ambulatory Visit: Payer: Self-pay

## 2021-12-21 MED ORDER — TRAMADOL HCL 50 MG PO TABS
50.0000 mg | ORAL_TABLET | Freq: Four times a day (QID) | ORAL | 0 refills | Status: DC | PRN
  Filled 2021-12-21: qty 90, 23d supply, fill #0

## 2021-12-21 MED ORDER — ALBUTEROL SULFATE HFA 108 (90 BASE) MCG/ACT IN AERS
INHALATION_SPRAY | RESPIRATORY_TRACT | 3 refills | Status: DC
  Filled 2021-12-21: qty 18, 30d supply, fill #0
  Filled 2022-03-25: qty 6.7, 25d supply, fill #1
  Filled 2022-06-24: qty 20.1, 75d supply, fill #2

## 2021-12-22 ENCOUNTER — Other Ambulatory Visit: Payer: Self-pay

## 2021-12-23 ENCOUNTER — Other Ambulatory Visit (HOSPITAL_COMMUNITY): Payer: BC Managed Care – PPO

## 2021-12-24 ENCOUNTER — Other Ambulatory Visit (HOSPITAL_COMMUNITY): Payer: BC Managed Care – PPO

## 2022-01-04 ENCOUNTER — Ambulatory Visit: Payer: BC Managed Care – PPO | Admitting: Nurse Practitioner

## 2022-01-23 LAB — T4, FREE: Free T4: 1.76 ng/dL (ref 0.82–1.77)

## 2022-01-23 LAB — TSH: TSH: <0.005 u[IU]/mL — ABNORMAL LOW (ref 0.450–4.500)

## 2022-01-23 LAB — T3, FREE: T3, Free: 5.1 pg/mL — ABNORMAL HIGH (ref 2.0–4.4)

## 2022-01-25 ENCOUNTER — Other Ambulatory Visit: Payer: Self-pay

## 2022-01-25 ENCOUNTER — Other Ambulatory Visit: Payer: Self-pay | Admitting: Family Medicine

## 2022-01-25 ENCOUNTER — Encounter: Payer: Self-pay | Admitting: Family Medicine

## 2022-01-25 NOTE — Telephone Encounter (Signed)
Requested medication (s) are due for refill today - yes  Requested medication (s) are on the active medication list -yes  Future visit scheduled -no  Last refill: 12/21/21 #90  Notes to clinic: non delegated Rx  Requested Prescriptions  Pending Prescriptions Disp Refills   traMADol (ULTRAM) 50 MG tablet 90 tablet 0    Sig: Take 1 tablet (50 mg total) by mouth every 6 (six) hours as needed. for pain     Not Delegated - Analgesics:  Opioid Agonists Failed - 01/25/2022  7:47 AM      Failed - This refill cannot be delegated      Failed - Urine Drug Screen completed in last 360 days      Failed - Valid encounter within last 3 months    Recent Outpatient Visits           5 months ago Controlled type 2 diabetes mellitus without complication, without long-term current use of insulin ([place])   [place] [name], Cammie Mcgee, MD   1 year ago Testosterone deficiency   [place] Susy Frizzle, MD   1 year ago Uncontrolled type 2 diabetes mellitus with hyperglycemia ([place])   [place] Susy Frizzle, MD   1 year ago Uncontrolled type 2 diabetes mellitus with hyperglycemia ([place])   [place] [name], Cammie Mcgee, MD   1 year ago Chronic fatigue   [place] [name], Cammie Mcgee, MD                 Requested Prescriptions  Pending Prescriptions Disp Refills   traMADol (ULTRAM) 50 MG tablet 90 tablet 0    Sig: Take 1 tablet (50 mg total) by mouth every 6 (six) hours as needed. for pain     Not Delegated - Analgesics:  Opioid Agonists Failed - 01/25/2022  7:47 AM      Failed - This refill cannot be delegated      Failed - Urine Drug Screen completed in last 360 days      Failed - Valid encounter within last 3 months    Recent Outpatient Visits           5 months ago Controlled type 2 diabetes mellitus without complication, without long-term current use of insulin ([place])   [place] [name], Cammie Mcgee, MD   1 year ago Testosterone deficiency   [place] Susy Frizzle, MD   1 year ago Uncontrolled type 2 diabetes mellitus with hyperglycemia ([place])   [place] Susy Frizzle, MD   1 year ago Uncontrolled type 2 diabetes mellitus with hyperglycemia ([place])   [place] [name], Cammie Mcgee, MD   1 year ago Chronic fatigue   [place] [name], Cammie Mcgee, MD

## 2022-01-26 ENCOUNTER — Telehealth: Payer: Self-pay

## 2022-01-26 ENCOUNTER — Other Ambulatory Visit: Payer: Self-pay

## 2022-01-26 NOTE — Telephone Encounter (Signed)
Pt called in to inquire about his request for  Rx #: [id number]  traMADol (ULTRAM) 50 MG tablet [[id number]]   Order Details Dose: 50 mg Route: Oral Frequency: Every 6 hours PRN  Dispense Quantity: 90 tablet Refills: 0 Fills remaining: 0  Note to Pharmacy: Not to exceed 5 additional fills before 02/10/2021   LOV: 10/26/21  PHARMACY: [facility]

## 2022-01-26 NOTE — Telephone Encounter (Signed)
Requested medications are due for refill today.  Unsure  Requested medications are on the active medications list.  yes  Last refill. 12/21/2021 #90 0 refills  Future visit scheduled.   no  Notes to clinic.  Refill not delegated.    Requested Prescriptions  Pending Prescriptions Disp Refills   traMADol (ULTRAM) 50 MG tablet 90 tablet 0    Sig: Take 1 tablet (50 mg total) by mouth every 6 (six) hours as needed. for pain     Not Delegated - Analgesics:  Opioid Agonists Failed - 01/25/2022  7:12 PM      Failed - This refill cannot be delegated      Failed - Urine Drug Screen completed in last 360 days      Failed - Valid encounter within last 3 months    Recent Outpatient Visits           5 months ago Controlled type 2 diabetes mellitus without complication, without long-term current use of insulin ([place])   [place] [name], Cammie Mcgee, MD   1 year ago Testosterone deficiency   [place] Susy Frizzle, MD   1 year ago Uncontrolled type 2 diabetes mellitus with hyperglycemia ([place])   [place] Susy Frizzle, MD   1 year ago Uncontrolled type 2 diabetes mellitus with hyperglycemia ([place])   [place] [name], Cammie Mcgee, MD   1 year ago Chronic fatigue   [place] [name], Cammie Mcgee, MD

## 2022-01-28 ENCOUNTER — Other Ambulatory Visit: Payer: Self-pay

## 2022-01-28 ENCOUNTER — Ambulatory Visit: Payer: BC Managed Care – PPO | Admitting: Nurse Practitioner

## 2022-01-28 ENCOUNTER — Other Ambulatory Visit: Payer: Self-pay | Admitting: Family Medicine

## 2022-01-28 ENCOUNTER — Encounter: Payer: Self-pay | Admitting: Nurse Practitioner

## 2022-01-28 VITALS — BP 125/80 | HR 74 | Ht 73.0 in | Wt 216.2 lb

## 2022-01-28 DIAGNOSIS — E0590 Thyrotoxicosis, unspecified without thyrotoxic crisis or storm: Secondary | ICD-10-CM

## 2022-01-28 DIAGNOSIS — R768 Other specified abnormal immunological findings in serum: Secondary | ICD-10-CM | POA: Diagnosis not present

## 2022-01-28 MED ORDER — METHIMAZOLE 10 MG PO TABS: 5.0000 mg | ORAL_TABLET | Freq: Two times a day (BID) | ORAL | 1 refills | Status: DC

## 2022-01-28 NOTE — Progress Notes (Signed)
01/28/2022     Endocrinology Follow Up Note    Subjective:    Patient ID: Kyle Campbell, male    DOB: Aug 06, 1960, PCP Susy Frizzle, MD.   Past Medical History:  Diagnosis Date   Asthma    Diabetes mellitus without complication ([place])    Erectile dysfunction    GERD (gastroesophageal reflux disease)    Hyperlipidemia    Obstructive sleep apnea    compliant with CPAP   Testosterone deficiency     Past Surgical History:  Procedure Laterality Date   TONSILLECTOMY     age 45    Social History   Socioeconomic History   Marital status: Married    Spouse name: Not on file   Number of children: Not on file   Years of education: Not on file   Highest education level: Not on file  Occupational History   Not on file  Tobacco Use   Smoking status: Never   Smokeless tobacco: Never  Vaping Use   Vaping Use: Never used  Substance and Sexual Activity   Alcohol use: Yes    Alcohol/week: 6.0 standard drinks of alcohol    Types: 1 Standard drinks or equivalent, 5 Shots of liquor per week    Comment: weekend only   Drug use: No   Sexual activity: Yes    Comment: Married.    Other Topics Concern   Not on file  Social History Narrative   Has 3 children. One 34 year, and 33 year old twins. Works as Administrator. Occasional  ETOH.    Social Determinants of Health   Financial Resource Strain: Not on file  Food Insecurity: Not on file  Transportation Needs: Not on file  Physical Activity: Not on file  Stress: Not on file  Social Connections: Not on file    Family History  Problem Relation Age of Onset   Lung cancer Father     Outpatient Encounter Medications as of 01/28/2022  Medication Sig   albuterol (VENTOLIN HFA) 108 (90 Base) MCG/ACT inhaler INHALE 1 PUFF BY MOUTH EVERY FOUR HOURS AS NEEDED   fluticasone-salmeterol (ADVAIR DISKUS) 250-50 MCG/ACT AEPB Inhale 1 puff into the lungs 2 (two) times daily.   glucose blood (ACCU-CHEK AVIVA PLUS) test strip  Use as instructed   Insulin Pen Needle 29G X 12MM MISC 1 Dose by Does not apply route every 14 (fourteen) days.   Lancets (ACCU-CHEK MULTICLIX) lancets Use as instructed   propranolol (INDERAL) 20 MG tablet Take 1 tablet (20 mg total) by mouth 2 (two) times daily.   sildenafil (VIAGRA) 100 MG tablet Take 0.5-1 tablets (50-100 mg total) by mouth daily as needed for erectile dysfunction.   traMADol (ULTRAM) 50 MG tablet Take 1 tablet (50 mg total) by mouth every 6 (six) hours as needed. for pain   UNABLE TO FIND CPAP mask - DX: OSA G47.33   [DISCONTINUED] methimazole (TAPAZOLE) 10 MG tablet Take 1 tablet (10 mg total) by mouth 2 (two) times daily.   albuterol (PROVENTIL) (2.5 MG/3ML) 0.083% nebulizer solution USE ONE VIAL VIA NEBULIZER EVERY 6 HOURS AS NEEDED FOR WHEEZING OR SHORTNESS OF BREATH.   methimazole (TAPAZOLE) 10 MG tablet Take 0.5 tablets (5 mg total) by mouth 2 (two) times daily.   No facility-administered encounter medications on file as of 01/28/2022.    ALLERGIES: No Known Allergies  VACCINATION STATUS: Immunization History  Administered Date(s) Administered   Influenza,inj,Quad PF,6+ Mos 04/08/2015, 01/19/2016, 04/21/2017, 03/01/2019  Influenza-Unspecified 05/20/2003   Td 04/26/2002   Tdap 02/02/2013     HPI  Kyle ForesterJames E Surman is 61 y.o. male who presents today with a medical history as above. he is being seen in consultation for hyperthyroidism requested by Donita BrooksPickard, Warren T, MD.  he has been dealing with symptoms of unexplained weight loss, tremors, palpitations, heat intolerance, weakness, fatigue, irritability, dry eyes, dizziness, and diarrhea for about 6 months now. These symptoms are progressively worsening and troubling to him.  his most recent thyroid labs revealed suppressed TSH of < 0.01 and elevated FT4 of 1.6, elevated FT3 of 7.3 and positive thyroid stimulating immunoglobulin on 6/27.  he denies dysphagia, choking, shortness of breath, no recent voice change.     he denies family history of thyroid dysfunction and denies family hx of thyroid cancer. He does not know his family history on his fathers side.  he denies personal history of goiter. he is not on any anti-thyroid medications nor on any thyroid hormone supplements. Denies use of Biotin containing supplements.  he is willing to proceed with appropriate work up and therapy for thyrotoxicosis.  He has never had any imaging of his thyroid in the past.   Review of systems  Constitutional: + decreasing body weight, current Body mass index is 28.52 kg/m., + fatigue, + subjective hyperthermia, no subjective hypothermia Eyes: + blurry vision, + xerophthalmia ENT: no sore throat, no nodules palpated in throat, no dysphagia/odynophagia, no hoarseness Cardiovascular: no chest pain, no shortness of breath, + palpitations-improved, no leg swelling, easily bruised Respiratory: no cough, no shortness of breath Gastrointestinal: no nausea/vomiting, + diarrhea Musculoskeletal: no muscle/joint aches Skin: no rashes, no hyperemia Neurological: + tremors-improved, no numbness, no tingling, + dizziness Psychiatric: no depression, no anxiety, + irritability   Objective:    BP 125/80 (BP Location: Left Arm, Patient Position: Sitting, Cuff Size: Large)   Pulse 74   Ht 6\' 1"  (1.854 m)   Wt 216 lb 3.2 oz (98.1 kg)   BMI 28.52 kg/m   Wt Readings from Last 3 Encounters:  01/28/22 216 lb 3.2 oz (98.1 kg)  12/16/21 219 lb (99.3 kg)  10/26/21 220 lb (99.8 kg)     BP Readings from Last 3 Encounters:  01/28/22 125/80  12/16/21 126/70  10/26/21 128/78                     Physical Exam- Limited  Constitutional:  Body mass index is 28.52 kg/m. , not in acute distress, normal state of mind Eyes:  EOMI, no exophthalmos Neck: Supple Cardiovascular: RRR, no murmurs, rubs, or gallops, no edema Respiratory: Adequate breathing efforts, no crackles, rales, rhonchi, or wheezing Musculoskeletal: no gross  deformities, strength intact in all four extremities, no gross restriction of joint movements Skin:  no rashes, no hyperemia Neurological: no tremor with outstretched hands   CMP     Component Value Date/Time   NA 140 10/26/2021 1644   K 4.5 10/26/2021 1644   CL 102 10/26/2021 1644   CO2 29 10/26/2021 1644   GLUCOSE 100 (H) 10/26/2021 1644   BUN 16 10/26/2021 1644   CREATININE 1.49 (H) 10/26/2021 1644   CALCIUM 9.4 10/26/2021 1644   PROT 6.2 10/26/2021 1644   ALBUMIN 4.5 09/20/2016 1018   AST 14 10/26/2021 1644   ALT 22 10/26/2021 1644   ALKPHOS 72 09/20/2016 1018   BILITOT 0.3 10/26/2021 1644   GFRNONAA 73 09/19/2020 1622   GFRAA 84 09/19/2020 1622  CBC    Component Value Date/Time   WBC 8.4 10/26/2021 1644   RBC 4.84 10/26/2021 1644   HGB 14.9 10/26/2021 1644   HCT 43.3 10/26/2021 1644   PLT 204 10/26/2021 1644   MCV 89.5 10/26/2021 1644   MCH 30.8 10/26/2021 1644   MCHC 34.4 10/26/2021 1644   RDW 12.5 10/26/2021 1644   LYMPHSABS 2,839 10/26/2021 1644   MONOABS 380 09/20/2016 1018   EOSABS 529 (H) 10/26/2021 1644   BASOSABS 42 10/26/2021 1644     Diabetic Labs (most recent): Lab Results  Component Value Date   HGBA1C 6.1 (H) 10/26/2021   HGBA1C 5.9 (H) 08/03/2021   HGBA1C 6.0 (H) 01/05/2021   MICROALBUR <0.2 01/05/2021   MICROALBUR 0.2 04/18/2020   MICROALBUR <0.2 04/15/2017    Lipid Panel     Component Value Date/Time   CHOL 167 01/05/2021 0820   TRIG 258 (H) 01/05/2021 0820   HDL 38 (L) 01/05/2021 0820   CHOLHDL 4.4 01/05/2021 0820   VLDL 39 (H) 09/20/2016 1018   LDLCALC 95 01/05/2021 0820     Lab Results  Component Value Date   TSH <0.005 (L) 01/22/2022   TSH <0.005 (L) 12/16/2021   TSH <0.01 (L) 10/26/2021   TSH 1.33 09/19/2020   TSH 1.36 01/13/2016   TSH 1.336 09/06/2014   TSH 1.570 10/17/2008   FREET4 1.76 01/22/2022   FREET4 2.73 (H) 12/16/2021   FREET4 1.6 11/03/2021   FREET4 5.3 10/17/2008      Latest Reference Range &  Units 09/19/20 16:22 10/26/21 16:44 11/03/21 08:47 12/16/21 15:06 01/22/22 08:15  TSH 0.450 - 4.500 uIU/mL 1.33 <0.01 (L)  <0.005 (L) <0.005 (L)  Triiodothyronine,Free,Serum 2.0 - 4.4 pg/mL   7.3 (H) 9.8 (H) 5.1 (H)  T4,Free(Direct) 0.82 - 1.77 ng/dL   1.6 6.62 (H) 9.47  Thyroperoxidase Ab SerPl-aCnc <9 IU/mL   <1    THYROID STIMULATING IMMUNOGLOBULIN    Rpt !    (L): Data is abnormally low (H): Data is abnormally high !: Data is abnormal Rpt: View report in Results Review for more information   Assessment & Plan:   1. Hyperthyroidism- suspect R/t Graves disease 2. Thyroid antibody positive  he is being seen at a kind request of Donita Brooks, MD.  his history and most recent labs are reviewed, and he was examined clinically. Subjective and objective findings are consistent with thyrotoxicosis likely from primary hyperthyroidism. The potential risks of untreated thyrotoxicosis and the need for definitive therapy have been discussed in detail with him, and he agrees to proceed with diagnostic workup and treatment plan.   His repeat thyroid function tests show he has responded nicely to the Methimazole treatment.  I did advise him to lower his dose of Methimazole to 5 mg (1/2 tab) twice daily for now.  Will repeat his thyroid function in 3 weeks to see if stable enough to stop the medication and perform the uptake and scan.  He is advised to continue his Propanolol 20 mg po twice daily for symptom management.   Options of therapy are discussed with him.  We discussed the option of treating it with medications including methimazole or PTU which may have side effects including rash, transaminitis, and bone marrow suppression.  We also discussed the option of definitive therapy with RAI ablation of the thyroid. If he is found to have primary hyperthyroidism from Graves' disease , toxic multinodular goiter or toxic nodular goiter the preferred modality of treatment would be I-131  thyroid  ablation. Surgery is another choice of treatment in some cases, in his case surgery is not a good fit for presentation with only mild goiter.  -Patient is made aware of the high likelihood of post ablative hypothyroidism with subsequent need for lifelong thyroid hormone replacement. heunderstands this outcome and he is  willing to proceed.        -Patient is advised to maintain close follow up with Donita Brooks, MD for primary care needs.   I spent 22 minutes in the care of the patient today including review of labs from Thyroid Function, CMP, and other relevant labs ; imaging/biopsy records (current and previous including abstractions from other facilities); face-to-face time discussing  his lab results and symptoms, medications doses, his options of short and long term treatment based on the latest standards of care / guidelines;   and documenting the encounter.  Kyle Campbell  participated in the discussions, expressed understanding, and voiced agreement with the above plans.  All questions were answered to his satisfaction. he is encouraged to contact clinic should he have any questions or concerns prior to his return visit.  Follow up plan: Return in about 3 weeks (around 02/18/2022) for Thyroid follow up, Previsit labs.   Thank you for involving me in the care of this pleasant patient, and I will continue to update you with his progress.    Ronny Bacon, [facility] [address] [place], [place] [address] Phone: [phone] Fax: [phone]  01/28/2022, 8:45 AM

## 2022-01-28 NOTE — Patient Instructions (Signed)
Lower Methimazole to 5 mg twice daily (1/2 tab).  Continue Propanolol 20 mg twice daily.  This will help prevent symptoms from worsening.  Repeat labs in 3 weeks.

## 2022-01-29 ENCOUNTER — Other Ambulatory Visit: Payer: Self-pay

## 2022-01-29 ENCOUNTER — Other Ambulatory Visit: Payer: BC Managed Care – PPO

## 2022-01-29 ENCOUNTER — Other Ambulatory Visit: Payer: Self-pay | Admitting: Family Medicine

## 2022-01-29 DIAGNOSIS — E119 Type 2 diabetes mellitus without complications: Secondary | ICD-10-CM

## 2022-01-29 MED ORDER — TRAMADOL HCL 50 MG PO TABS
50.0000 mg | ORAL_TABLET | Freq: Four times a day (QID) | ORAL | 0 refills | Status: DC | PRN
  Filled 2022-01-29: qty 90, 23d supply, fill #0

## 2022-01-29 NOTE — Addendum Note (Signed)
Addended by: Randal Buba K on: 01/29/2022 08:28 AM   Modules accepted: Orders

## 2022-02-01 ENCOUNTER — Other Ambulatory Visit: Payer: Self-pay

## 2022-02-01 ENCOUNTER — Ambulatory Visit (INDEPENDENT_AMBULATORY_CARE_PROVIDER_SITE_OTHER): Payer: BC Managed Care – PPO | Admitting: Family Medicine

## 2022-02-01 VITALS — BP 124/72 | HR 73 | Temp 98.7°F | Ht 73.0 in | Wt 219.2 lb

## 2022-02-01 DIAGNOSIS — E1165 Type 2 diabetes mellitus with hyperglycemia: Secondary | ICD-10-CM | POA: Diagnosis not present

## 2022-02-01 DIAGNOSIS — E349 Endocrine disorder, unspecified: Secondary | ICD-10-CM

## 2022-02-01 MED ORDER — METFORMIN HCL 500 MG PO TABS
500.0000 mg | ORAL_TABLET | Freq: Two times a day (BID) | ORAL | 3 refills | Status: DC
  Filled 2022-02-01: qty 180, 90d supply, fill #0
  Filled 2022-06-21: qty 180, 90d supply, fill #1
  Filled 2022-10-19: qty 180, 90d supply, fill #2

## 2022-02-01 MED ORDER — SILDENAFIL CITRATE 100 MG PO TABS
50.0000 mg | ORAL_TABLET | Freq: Every day | ORAL | 11 refills | Status: DC | PRN
  Filled 2022-02-01: qty 5, 30d supply, fill #0

## 2022-02-01 NOTE — Progress Notes (Signed)
Subjective:    Patient ID: Kyle Campbell, male    DOB: 1961/05/07, 61 y.o.   MRN: [id number] When I last saw the patient, he was extremely tired and losing weight.  At that time I had him hold his Trulicity and stop his metformin concerned it was causing excessive weight loss.  However during the work-up we also discovered that he had Graves' disease.  He is currently on methimazole and is seeing an endocrinologist.  However he has not been taking his metformin or his Trulicity.  He recently came in for fasting lab work and his fasting blood sugar was over 300, his A1c was 9.2.  He does report polyuria and polydipsia.  He also has a history of hypogonadism.  He reports erectile dysfunction and for libido.  He has decided that he will be willing to take testosterone supplements now if it would help with his libido and his energy Past Medical History:  Diagnosis Date   Asthma    Diabetes mellitus without complication ([facility]CC)    Erectile dysfunction    GERD (gastroesophageal reflux disease)    Hyperlipidemia    Obstructive sleep apnea    compliant with CPAP   Testosterone deficiency    Past Surgical History:  Procedure Laterality Date   TONSILLECTOMY     age 63   Current Outpatient Medications on File Prior to Visit  Medication Sig Dispense Refill   albuterol (VENTOLIN HFA) 108 (90 Base) MCG/ACT inhaler INHALE 1 PUFF BY MOUTH EVERY FOUR HOURS AS NEEDED 18 g 3   fluticasone-salmeterol (ADVAIR DISKUS) 250-50 MCG/ACT AEPB Inhale 1 puff into the lungs 2 (two) times daily. 60 each 3   glucose blood (ACCU-CHEK AVIVA PLUS) test strip Use as instructed 100 each 12   Insulin Pen Needle 29G X 12MM MISC 1 Dose by Does not apply route every 14 (fourteen) days. 30 each 3   Lancets (ACCU-CHEK MULTICLIX) lancets Use as instructed 100 each 12   methimazole (TAPAZOLE) 10 MG tablet Take 0.5 tablets (5 mg total) by mouth 2 (two) times daily. 180 tablet 1   propranolol (INDERAL) 20 MG tablet Take 1 tablet (20  mg total) by mouth 2 (two) times daily. 180 tablet 1   traMADol (ULTRAM) 50 MG tablet Take 1 tablet (50 mg total) by mouth every 6 (six) hours as needed. for pain 90 tablet 0   UNABLE TO FIND CPAP mask - DX: OSA G47.33 1 Device 0   albuterol (PROVENTIL) (2.5 MG/3ML) 0.083% nebulizer solution USE ONE VIAL VIA NEBULIZER EVERY 6 HOURS AS NEEDED FOR WHEEZING OR SHORTNESS OF BREATH. 150 mL 1   No current facility-administered medications on file prior to visit.       No Known Allergies Social History   Socioeconomic History   Marital status: Married    Spouse name: Not on file   Number of children: Not on file   Years of education: Not on file   Highest education level: Not on file  Occupational History   Not on file  Tobacco Use   Smoking status: Never   Smokeless tobacco: Never  Vaping Use   Vaping Use: Never used  Substance and Sexual Activity   Alcohol use: Yes    Alcohol/week: 6.0 standard drinks of alcohol    Types: 1 Standard drinks or equivalent, 5 Shots of liquor per week    Comment: weekend only   Drug use: No   Sexual activity: Yes    Comment: Married.  Other Topics Concern   Not on file  Social History Narrative   Has 3 children. One 71 year, and 63 year old twins. 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    Social Determinants of Health   Financial Resource Strain: Not on file  Food Insecurity: Not on file  Transportation Needs: Not on file  Physical Activity: Not on file  Stress: Not on file  Social Connections: Not on file  Intimate Partner Violence: Not on file      Review of Systems  All other systems reviewed and are negative.      Objective:   Physical Exam Constitutional:      General: He is not in acute distress.    Appearance: He is well-developed. He is not diaphoretic.  Eyes:     Conjunctiva/sclera: Conjunctivae normal.     Pupils: Pupils are equal, round, and reactive to light.  Neck:     Thyroid: No thyromegaly.     Vascular:  No JVD.  Cardiovascular:     Rate and Rhythm: Normal rate and regular rhythm.     Heart sounds: Normal heart sounds. No murmur heard.    No friction rub. No gallop.  Pulmonary:     Effort: Pulmonary effort is normal. No respiratory distress.     Breath sounds: No decreased air movement. No wheezing, rhonchi or rales.  Abdominal:     General: Bowel sounds are normal. There is no distension.     Palpations: Abdomen is soft.     Tenderness: There is no abdominal tenderness. There is no rebound.  Genitourinary:    Prostate: Normal.     Rectum: Normal.  Musculoskeletal:     Cervical back: Neck supple.  Lymphadenopathy:     Cervical: No cervical adenopathy.  Neurological:     Mental Status: He is alert and oriented to person, place, and time.     Cranial Nerves: No cranial nerve deficit.     Motor: No abnormal muscle tone.     Coordination: Coordination normal.     Deep Tendon Reflexes: Reflexes are normal and symmetric.           Assessment & Plan:  Testosterone deficiency  Uncontrolled type 2 diabetes mellitus with hyperglycemia (HCC) Patient needs to resume Trulicity 1.5 mg subcu weekly.  We will also resume metformin 500 mg twice daily.  I have asked the patient to check in with me in 1 month and report his fasting blood sugars and his 2-hour postprandial sugars.  We will uptitrate metformin at that time if his fasting blood sugars are not under 130.  I will also add a testosterone level to his recent labs Orders Only on 01/29/2022  Component Date Value Ref Range Status   Glucose, Bld 01/29/2022 315 (H)  65 - 99 mg/dL Final   Comment: .            Fasting reference interval . For someone without known diabetes, a glucose value >125 mg/dL indicates that they may have diabetes and this should be confirmed with a follow-up test. .    BUN 01/29/2022 11  7 - 25 mg/dL Final   Creat 44/05/270 0.98  0.70 - 1.30 mg/dL Final   BUN/Creatinine Ratio 01/29/2022 SEE NOTE:  6 - 22  (calc) Final   Comment:    Not Reported: BUN and Creatinine are within    reference range. .    Sodium 01/29/2022 138  135 - 146 mmol/L Final   Potassium 01/29/2022  4.9  3.5 - 5.3 mmol/L Final   Chloride 01/29/2022 101  98 - 110 mmol/L Final   CO2 01/29/2022 27  20 - 32 mmol/L Final   Calcium 01/29/2022 9.6  8.6 - 10.3 mg/dL Final   Total Protein 60/63/0160 6.6  6.1 - 8.1 g/dL Final   Albumin 10/93/2355 4.3  3.6 - 5.1 g/dL Final   Globulin 73/22/0254 2.3  1.9 - 3.7 g/dL (calc) Final   AG Ratio 01/29/2022 1.9  1.0 - 2.5 (calc) Final   Total Bilirubin 01/29/2022 0.5  0.2 - 1.2 mg/dL Final   Alkaline phosphatase (APISO) 01/29/2022 118  35 - 144 U/L Final   AST 01/29/2022 14  10 - 35 U/L Final   ALT 01/29/2022 23  9 - 46 U/L Final   Hgb A1c MFr Bld 01/29/2022 9.2 (H)  <5.7 % of total Hgb Final   Comment: For someone without known diabetes, a hemoglobin A1c value of 6.5% or greater indicates that they may have  diabetes and this should be confirmed with a follow-up  test. . For someone with known diabetes, a value <7% indicates  that their diabetes is well controlled and a value  greater than or equal to 7% indicates suboptimal  control. A1c targets should be individualized based on  duration of diabetes, age, comorbid conditions, and  other considerations. . Currently, no consensus exists regarding use of hemoglobin A1c for diagnosis of diabetes for children. .    Mean Plasma Glucose 01/29/2022 217  mg/dL Final   eAG (mmol/L) 27/10/2374 12.0  mmol/L Final   Tracking House Account 01/29/2022    Final   Comment: We were unable to identify an account number for the order submitted. If you do not have a [facility]  account number or if your account information needs  to be updated please call [phone]) for assistance. . To prevent delays in testing and processing of your orders please provide the following information for this order and with every  additional order submitted: [facility] account number and account name  Client address Client phone and fax number NPI number of ordering physician along with the physician name.    If his testosterone level remains low we will resume testosterone replacement.  I will start testosterone gel 1.62% 2 pumps applied to the skin daily to try to assist in his low libido and erectile dysfunction.  Meanwhile patient will start taking Viagra as needed.  I will defer management of his Graves' disease to his endocrinologist.

## 2022-02-02 LAB — HOUSE ACCOUNT TRACKING

## 2022-02-02 LAB — COMPREHENSIVE METABOLIC PANEL
AG Ratio: 1.9 (calc) (ref 1.0–2.5)
ALT: 23 U/L (ref 9–46)
AST: 14 U/L (ref 10–35)
Albumin: 4.3 g/dL (ref 3.6–5.1)
Alkaline phosphatase (APISO): 118 U/L (ref 35–144)
BUN: 11 mg/dL (ref 7–25)
CO2: 27 mmol/L (ref 20–32)
Calcium: 9.6 mg/dL (ref 8.6–10.3)
Chloride: 101 mmol/L (ref 98–110)
Creat: 0.98 mg/dL (ref 0.70–1.30)
Globulin: 2.3 g/dL (calc) (ref 1.9–3.7)
Glucose, Bld: 315 mg/dL — ABNORMAL HIGH (ref 65–99)
Potassium: 4.9 mmol/L (ref 3.5–5.3)
Sodium: 138 mmol/L (ref 135–146)
Total Bilirubin: 0.5 mg/dL (ref 0.2–1.2)
Total Protein: 6.6 g/dL (ref 6.1–8.1)

## 2022-02-02 LAB — TEST AUTHORIZATION

## 2022-02-02 LAB — HEMOGLOBIN A1C
Hgb A1c MFr Bld: 9.2 % of total Hgb — ABNORMAL HIGH (ref <5.7)
Mean Plasma Glucose: 217 mg/dL
eAG (mmol/L): 12 mmol/L

## 2022-02-02 LAB — TESTOSTERONE: Testosterone: 359 ng/dL (ref 250–827)

## 2022-02-12 ENCOUNTER — Other Ambulatory Visit: Payer: Self-pay

## 2022-02-16 LAB — T4, FREE: Free T4: 1.39 ng/dL (ref 0.82–1.77)

## 2022-02-16 LAB — T3, FREE: T3, Free: 3.5 pg/mL (ref 2.0–4.4)

## 2022-02-16 LAB — TSH: TSH: <0.005 u[IU]/mL — ABNORMAL LOW (ref 0.450–4.500)

## 2022-02-18 ENCOUNTER — Encounter: Payer: Self-pay | Admitting: Nurse Practitioner

## 2022-02-18 ENCOUNTER — Ambulatory Visit: Payer: BC Managed Care – PPO | Admitting: Nurse Practitioner

## 2022-02-18 VITALS — BP 113/69 | HR 75 | Ht 73.0 in | Wt 217.0 lb

## 2022-02-18 DIAGNOSIS — E0590 Thyrotoxicosis, unspecified without thyrotoxic crisis or storm: Secondary | ICD-10-CM | POA: Diagnosis not present

## 2022-02-18 DIAGNOSIS — R768 Other specified abnormal immunological findings in serum: Secondary | ICD-10-CM | POA: Diagnosis not present

## 2022-02-18 NOTE — Progress Notes (Signed)
02/18/2022     Endocrinology Follow Up Note    Subjective:    Patient ID: Kyle Campbell, male    DOB: [birth year], PCP Donita Brooks, MD.   Past Medical History:  Diagnosis Date   Asthma    Diabetes mellitus without complication (HCC)    Erectile dysfunction    GERD (gastroesophageal reflux disease)    Hyperlipidemia    Obstructive sleep apnea    compliant with CPAP   Testosterone deficiency     Past Surgical History:  Procedure Laterality Date   TONSILLECTOMY     age 55    Social History   Socioeconomic History   Marital status: Married    Spouse name: Not on file   Number of children: Not on file   Years of education: Not on file   Highest education level: Not on file  Occupational History   Not on file  Tobacco Use   Smoking status: Never   Smokeless tobacco: Never  Vaping Use   Vaping Use: Never used  Substance and Sexual Activity   Alcohol use: Yes    Alcohol/week: 6.0 standard drinks of alcohol    Types: 1 Standard drinks or equivalent, 5 Shots of liquor per week    Comment: weekend only   Drug use: No   Sexual activity: Yes    Comment: Married.    Other Topics Concern   Not on file  Social History Narrative   Has 3 children. One 65 year, and 97 year old twins. 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    Social Determinants of Health   Financial Resource Strain: Not on file  Food Insecurity: Not on file  Transportation Needs: Not on file  Physical Activity: Not on file  Stress: Not on file  Social Connections: Not on file    Family History  Problem Relation Age of Onset   Lung cancer Father     Outpatient Encounter Medications as of 02/18/2022  Medication Sig   albuterol (VENTOLIN HFA) 108 (90 Base) MCG/ACT inhaler INHALE 1 PUFF BY MOUTH EVERY FOUR HOURS AS NEEDED   fluticasone-salmeterol (ADVAIR DISKUS) 250-50 MCG/ACT AEPB Inhale 1 puff into the lungs 2 (two) times daily.   glucose blood (ACCU-CHEK AVIVA PLUS) test  strip Use as instructed   Insulin Pen Needle 29G X MISC 1 Dose by Does not apply route every 14 (fourteen) days.   Lancets (ACCU-CHEK MULTICLIX) lancets Use as instructed   metFORMIN (GLUCOPHAGE) 500 MG tablet Take 1 tablet (500 mg total) by mouth 2 (two) times daily with a meal.   propranolol (INDERAL) 20 MG tablet Take 1 tablet (20 mg total) by mouth 2 (two) times daily.   sildenafil (VIAGRA) 100 MG tablet Take 0.5-1 tablets (50-100 mg total) by mouth daily as needed for erectile dysfunction.   traMADol (ULTRAM) 50 MG tablet Take 1 tablet (50 mg total) by mouth every 6 (six) hours as needed. for pain   UNABLE TO FIND CPAP mask - DX: OSA G47.33   [DISCONTINUED] methimazole (TAPAZOLE) 10 MG tablet Take 0.5 tablets (5 mg total) by mouth 2 (two) times daily.   albuterol (PROVENTIL) (2.5 MG/3ML) 0.083% nebulizer solution USE ONE VIAL VIA NEBULIZER EVERY 6 HOURS AS NEEDED FOR WHEEZING OR SHORTNESS OF BREATH.   No facility-administered encounter medications on file as of 02/18/2022.    ALLERGIES: No Known Allergies  VACCINATION STATUS: Immunization History  Administered Date(s) Administered   Influenza,inj,Quad PF,6+ Mos 04/08/2015, 01/19/2016,  04/21/2017, 03/01/2019   Influenza-Unspecified 05/20/2003   Td 04/26/2002   Tdap 02/02/2013     HPI  Kyle Campbell is 61 y.o. male who presents today with a medical history as above. he is being seen in consultation for hyperthyroidism requested by Donita Brooks, MD.  he has been dealing with symptoms of unexplained weight loss, tremors, palpitations, heat intolerance, weakness, fatigue, irritability, dry eyes, dizziness, and diarrhea for about 6 months now. These symptoms are progressively worsening and troubling to him.   he denies dysphagia, choking, shortness of breath, no recent voice change.    he denies family history of thyroid dysfunction and denies family hx of thyroid cancer. He does not know his family history on his fathers  side.  he denies personal history of goiter. he is not on any anti-thyroid medications nor on any thyroid hormone supplements. Denies use of Biotin containing supplements.  he is willing to proceed with appropriate work up and therapy for thyrotoxicosis.  He has never had any imaging of his thyroid in the past.   Review of systems  Constitutional: + decreasing body weight, current Body mass index is 28.63 kg/m., + fatigue, + subjective hyperthermia-improved, no subjective hypothermia Eyes: + blurry vision, + xerophthalmia ENT: no sore throat, no nodules palpated in throat, no dysphagia/odynophagia, no hoarseness Cardiovascular: no chest pain, no shortness of breath, + palpitations-improved, no leg swelling, easily bruised Respiratory: no cough, no shortness of breath Gastrointestinal: no nausea/vomiting, + diarrhea-improved Musculoskeletal: no muscle/joint aches Skin: no rashes, no hyperemia Neurological: + tremors-improved, no numbness, no tingling, + dizziness Psychiatric: no depression, no anxiety, + irritability-improved   Objective:    BP 113/69 (BP Location: Left Arm, Patient Position: Sitting, Cuff Size: Large)   Pulse 75   Ht 6\' 1"  (1.854 m)   Wt 217 lb (98.4 kg)   BMI 28.63 kg/m   Wt Readings from Last 3 Encounters:  02/18/22 217 lb (98.4 kg)  02/01/22 219 lb 3.2 oz (99.4 kg)  01/28/22 216 lb 3.2 oz (98.1 kg)     BP Readings from Last 3 Encounters:  02/18/22 113/69  02/01/22 124/72  01/28/22 125/80                      Physical Exam- Limited  Constitutional:  Body mass index is 28.63 kg/m. , not in acute distress, normal state of mind Eyes:  EOMI, no exophthalmos Neck: Supple Cardiovascular: RRR, no murmurs, rubs, or gallops, no edema Respiratory: Adequate breathing efforts, no crackles, rales, rhonchi, or wheezing Musculoskeletal: no gross deformities, strength intact in all four extremities, no gross restriction of joint movements Skin:  no rashes, no  hyperemia Neurological: no tremor with outstretched hands   CMP     Component Value Date/Time   NA 138 01/29/2022 0847   K 4.9 01/29/2022 0847   CL 101 01/29/2022 0847   CO2 27 01/29/2022 0847   GLUCOSE 315 (H) 01/29/2022 0847   BUN 11 01/29/2022 0847   CREATININE 0.98 01/29/2022 0847   CALCIUM 9.6 01/29/2022 0847   PROT 6.6 01/29/2022 0847   ALBUMIN 4.5 09/20/2016 1018   AST 14 01/29/2022 0847   ALT 23 01/29/2022 0847   ALKPHOS 72 09/20/2016 1018   BILITOT 0.5 01/29/2022 0847   GFRNONAA 73 09/19/2020 1622   GFRAA 84 09/19/2020 1622     CBC    Component Value Date/Time   WBC 8.4 10/26/2021 1644   RBC 4.84 10/26/2021 1644   HGB 14.9  10/26/2021 1644   HCT 43.3 10/26/2021 1644   PLT 204 10/26/2021 1644   MCV 89.5 10/26/2021 1644   MCH 30.8 10/26/2021 1644   MCHC 34.4 10/26/2021 1644   RDW 12.5 10/26/2021 1644   LYMPHSABS 2,839 10/26/2021 1644   MONOABS 380 09/20/2016 1018   EOSABS 529 (H) 10/26/2021 1644   BASOSABS 42 10/26/2021 1644     Diabetic Labs (most recent): Lab Results  Component Value Date   HGBA1C 9.2 (H) 01/29/2022   HGBA1C 6.1 (H) 10/26/2021   HGBA1C 5.9 (H) 08/03/2021   MICROALBUR <0.2 01/05/2021   MICROALBUR 0.2 04/18/2020   MICROALBUR <0.2 04/15/2017    Lipid Panel     Component Value Date/Time   CHOL 167 01/05/2021 0820   TRIG 258 (H) 01/05/2021 0820   HDL 38 (L) 01/05/2021 0820   CHOLHDL 4.4 01/05/2021 0820   VLDL 39 (H) 09/20/2016 1018   LDLCALC 95 01/05/2021 0820     Lab Results  Component Value Date   TSH <0.005 (L) 02/15/2022   TSH <0.005 (L) 01/22/2022   TSH <0.005 (L) 12/16/2021   TSH <0.01 (L) 10/26/2021   TSH 1.33 09/19/2020   TSH 1.36 01/13/2016   TSH 1.336 09/06/2014   TSH 1.570 10/17/2008   FREET4 1.39 02/15/2022   FREET4 1.76 01/22/2022   FREET4 2.73 (H) 12/16/2021   FREET4 1.6 11/03/2021   FREET4 5.3 10/17/2008      Latest Reference Range & Units 11/03/21 08:47 12/16/21 15:06 01/22/22 08:15 02/15/22 09:43   TSH 0.450 - 4.500 uIU/mL  <0.005 (L) <0.005 (L) <0.005 (L)  Triiodothyronine,Free,Serum 2.0 - 4.4 pg/mL 7.3 (H) 9.8 (H) 5.1 (H) 3.5  T4,Free(Direct) 0.82 - 1.77 ng/dL 1.6 6.76 (H) 1.95 0.93  Thyroperoxidase Ab SerPl-aCnc <9 IU/mL <1     THYROID STIMULATING IMMUNOGLOBULIN  Rpt !     (L): Data is abnormally low (H): Data is abnormally high !: Data is abnormal Rpt: View report in Results Review for more information   Assessment & Plan:   1. Hyperthyroidism- suspect R/t Graves disease 2. Thyroid antibody positive  he is being seen at a kind request of Donita Brooks, MD.  his history and most recent labs are reviewed, and he was examined clinically. Subjective and objective findings are consistent with thyrotoxicosis likely from primary hyperthyroidism. The potential risks of untreated thyrotoxicosis and the need for definitive therapy have been discussed in detail with him, and he agrees to proceed with diagnostic workup and treatment plan.   His repeat thyroid function tests show he has responded nicely to the Methimazole treatment, so much so that we can stop it altogether for now and proceed with uptake and scan.  He is aware that he has to be off the Methimazole for 5 days prior to having the procedure to prevent the medication from interfering with the test results.   He is advised to continue his Propanolol 20 mg po twice daily for symptom management.   Options of therapy are discussed with him.  We discussed the option of treating it with medications including methimazole or PTU which may have side effects including rash, transaminitis, and bone marrow suppression.  We also discussed the option of definitive therapy with RAI ablation of the thyroid. If he is found to have primary hyperthyroidism from Graves' disease , toxic multinodular goiter or toxic nodular goiter the preferred modality of treatment would be I-131 thyroid ablation. Surgery is another choice of treatment in some  cases, in his case  surgery is not a good fit for presentation with only mild goiter.  -Patient is made aware of the high likelihood of post ablative hypothyroidism with subsequent need for lifelong thyroid hormone replacement. heunderstands this outcome and he is  willing to proceed.        -Patient is advised to maintain close follow up with Susy Frizzle, MD for primary care needs.    I spent 20 minutes in the care of the patient today including review of labs from Thyroid Function, CMP, and other relevant labs ; imaging/biopsy records (current and previous including abstractions from other facilities); face-to-face time discussing  his lab results and symptoms, medications doses, his options of short and long term treatment based on the latest standards of care / guidelines;   and documenting the encounter.  Roselind Messier  participated in the discussions, expressed understanding, and voiced agreement with the above plans.  All questions were answered to his satisfaction. he is encouraged to contact clinic should he have any questions or concerns prior to his return visit.  Follow up plan: Return in about 4 weeks (around 03/18/2022) for Thyroid follow up; uptake and scan.   Thank you for involving me in the care of this pleasant patient, and I will continue to update you with his progress.   Rayetta Pigg, [facility] [address] [place], [place] [address] Phone: [phone] Fax: [phone]  02/18/2022, 8:33 AM

## 2022-02-23 ENCOUNTER — Encounter: Payer: Self-pay | Admitting: Nurse Practitioner

## 2022-03-01 ENCOUNTER — Telehealth: Payer: Self-pay | Admitting: *Deleted

## 2022-03-01 ENCOUNTER — Other Ambulatory Visit: Payer: Self-pay | Admitting: Family Medicine

## 2022-03-01 ENCOUNTER — Other Ambulatory Visit: Payer: Self-pay

## 2022-03-01 MED ORDER — TRULICITY 1.5 MG/0.5ML ~~LOC~~ SOAJ
1.5000 mg | SUBCUTANEOUS | 3 refills | Status: DC
  Filled 2022-03-01: qty 2, 28d supply, fill #0
  Filled 2022-04-05: qty 2, 28d supply, fill #1
  Filled 2022-05-27: qty 2, 28d supply, fill #2
  Filled 2022-06-21: qty 2, 28d supply, fill #3
  Filled 2022-07-26: qty 2, 28d supply, fill #4
  Filled 2022-08-30: qty 2, 28d supply, fill #5
  Filled 2022-09-27: qty 2, 28d supply, fill #6
  Filled 2022-11-04: qty 2, 28d supply, fill #7
  Filled 2022-12-06: qty 2, 28d supply, fill #8
  Filled 2023-01-06 – 2023-01-13 (×4): qty 2, 28d supply, fill #9
  Filled 2023-02-10: qty 2, 28d supply, fill #10

## 2022-03-01 MED ORDER — TRAMADOL HCL 50 MG PO TABS
50.0000 mg | ORAL_TABLET | Freq: Four times a day (QID) | ORAL | 0 refills | Status: DC | PRN
  Filled 2022-03-01: qty 90, 23d supply, fill #0

## 2022-03-01 NOTE — Telephone Encounter (Signed)
Can you follow up on this for me please?  Thanks

## 2022-03-01 NOTE — Telephone Encounter (Signed)
  Pt needs an appt before refills. However, I can't refuse request.   Please advice?

## 2022-03-01 NOTE — Telephone Encounter (Signed)
[facility] - Radiology/Leslie. She called patient this morning and gave him his appointment for next Wednesday , November 1, @ 9;30. This is for his uptake and scan.

## 2022-03-01 NOTE — Telephone Encounter (Signed)
[facility] has called the patient and given him his appointment for Wednesday , November 1 at 9:30 am.

## 2022-03-10 ENCOUNTER — Encounter (HOSPITAL_COMMUNITY)
Admission: RE | Admit: 2022-03-10 | Discharge: 2022-03-10 | Disposition: A | Discharge status: Home / Self Care | Payer: BC Managed Care – PPO | Source: Ambulatory Visit | Attending: Nurse Practitioner | Admitting: Nurse Practitioner

## 2022-03-10 ENCOUNTER — Encounter (HOSPITAL_COMMUNITY): Payer: Self-pay

## 2022-03-10 DIAGNOSIS — E0590 Thyrotoxicosis, unspecified without thyrotoxic crisis or storm: Secondary | ICD-10-CM | POA: Diagnosis not present

## 2022-03-10 DIAGNOSIS — R002 Palpitations: Secondary | ICD-10-CM | POA: Insufficient documentation

## 2022-03-10 DIAGNOSIS — R634 Abnormal weight loss: Secondary | ICD-10-CM | POA: Insufficient documentation

## 2022-03-10 DIAGNOSIS — R197 Diarrhea, unspecified: Secondary | ICD-10-CM | POA: Diagnosis not present

## 2022-03-10 DIAGNOSIS — R768 Other specified abnormal immunological findings in serum: Secondary | ICD-10-CM | POA: Insufficient documentation

## 2022-03-10 MED ORDER — SODIUM IODIDE I-123 7.4 MBQ CAPS
500.0000 | ORAL_CAPSULE | Freq: Once | ORAL | Status: AC
  Administered 2022-03-10: 453 via ORAL

## 2022-03-11 ENCOUNTER — Encounter (HOSPITAL_COMMUNITY)
Admission: RE | Admit: 2022-03-11 | Discharge: 2022-03-11 | Disposition: A | Discharge status: Home / Self Care | Payer: BC Managed Care – PPO | Source: Ambulatory Visit | Attending: Nurse Practitioner | Admitting: Nurse Practitioner

## 2022-03-18 ENCOUNTER — Encounter: Payer: Self-pay | Admitting: Nurse Practitioner

## 2022-03-18 ENCOUNTER — Ambulatory Visit: Payer: BC Managed Care – PPO | Admitting: Nurse Practitioner

## 2022-03-18 VITALS — BP 104/68 | HR 80 | Ht 73.0 in | Wt 214.2 lb

## 2022-03-18 DIAGNOSIS — R768 Other specified abnormal immunological findings in serum: Secondary | ICD-10-CM

## 2022-03-18 DIAGNOSIS — E041 Nontoxic single thyroid nodule: Secondary | ICD-10-CM | POA: Diagnosis not present

## 2022-03-18 DIAGNOSIS — E0590 Thyrotoxicosis, unspecified without thyrotoxic crisis or storm: Secondary | ICD-10-CM

## 2022-03-18 NOTE — Patient Instructions (Signed)
Thyroid Nodule  A thyroid nodule is an isolated growth of thyroid cells that forms a lump in the thyroid gland. The thyroid gland is a butterfly-shaped gland found in the lower front of the neck. It sends chemical messengers (hormones) through the blood to all parts of the body. These hormones are important in regulating body temperature and helping the body use energy. Thyroid nodules are common. Most are not cancerous (are benign). You may have one nodule or several nodules. There are different types of thyroid nodules. They include nodules that: Grow and fill with fluid (thyroid cysts). Produce too much thyroid hormone (hot nodules or hyperthyroid). Produce no thyroid hormone (cold nodules or hypothyroid). Form from cancer cells (thyroid cancers). What are the causes? In most cases, the cause of thyroid nodules is not known. What increases the risk? The following factors may make you more likely to develop thyroid nodules: Age. Thyroid nodules are more common in people who are older than 45 years. Male gender. A family history that includes: Thyroid nodules. Pheochromocytoma. Thyroid carcinoma. Hyperparathyroidism. Certain thyroid diseases, such as Hashimoto's thyroiditis. Lack of iodine in your diet. A history of head and neck radiation, such as from cancer treatments. Type 2 diabetes. What are the signs or symptoms? In many cases, there are no symptoms. If you have symptoms, they may include: A lump in your lower neck. Feeling pressure, fullness, or a tickle in your throat. Pain in your neck, jaw, or ear. Having trouble swallowing or breathing. Hot nodules may cause: Weight loss. Warm, flushed skin. Feeling hot. Feeling nervous. A rapid or irregular heartbeat. Cold nodules may cause: Weight gain. Dry skin. Hair loss, brittle hair, or both. Feeling cold. Fatigue. Thyroid cancer nodules may cause: Hard nodules that can be felt along the thyroid  gland. Hoarseness. Lumps in the tissue (lymph nodes) near your thyroid gland. How is this diagnosed? A thyroid nodule may be felt by your health care provider during a physical exam. This condition may also be diagnosed based on your symptoms. You may also have tests, including: Blood tests to check how well your thyroid is working. An ultrasound. This may be done to confirm the diagnosis. A biopsy. This involves taking a sample from the nodule and looking at it under a microscope. A thyroid scan. This test creates an image of the thyroid gland using a radioactive tracer. Imaging tests such as an MRI or CT scan. These may be done if: A nodule is large. A nodule is blocking your airway. Cancer is suspected. How is this treated? Treatment depends on the cause and size of your nodule or nodules. If a nodule is benign, treatment may not be necessary. Your health care provider may monitor the nodule to see if it goes away without treatment. If a nodule continues to grow, is cancerous, or does not go away, treatment may be needed. Treatment may include: Having a cystic nodule drained with a needle. Ablation therapy. In this treatment, alcohol is injected into the area of the nodule to destroy the cells. Ablation with heat may also be used. This is called thermal ablation. Radioactive iodine. In this treatment, radioactive iodine is given as a pill or liquid that you drink. This substance causes the thyroid nodule to shrink. Surgery to remove the nodule or nodules. Part or all of your thyroid gland may also need to be removed. Medicines to treat hyperthyroidism. Follow these instructions at home: Pay attention to any changes in your thyroid nodule or nodules. Take over-the-counter   and prescription medicines only as told by your health care provider. Keep all follow-up visits. This is important. Contact a health care provider if: You have trouble sleeping. You have muscle weakness. You have  significant weight loss without changing your eating habits. You feel nervous. You have trouble swallowing. You have increased swelling. You have a rapid or irregular heartbeat. Get help right away if: You have chest pain. You faint or lose consciousness. Your nodule makes it hard for you to breathe. These symptoms may be an emergency. Get help right away. Call 911. Do not wait to see if the symptoms will go away. Do not drive yourself to the hospital. Summary A thyroid nodule is an isolated growth of thyroid cells that forms a lump in your thyroid gland. Thyroid nodules are common. Most are not cancerous. Your health care provider may monitor the nodule to see if it goes away without treatment. If a nodule continues to grow, is cancerous, or does not go away, treatment may be needed. Treatment depends on the cause and size of your nodule or nodules. This information is not intended to replace advice given to you by your health care provider. Make sure you discuss any questions you have with your health care provider. Document Revised: 03/09/2021 Document Reviewed: 03/09/2021 Elsevier Patient Education  2023 [employer]  

## 2022-03-18 NOTE — Progress Notes (Signed)
03/18/2022     Endocrinology Follow Up Note    Subjective:    Patient ID: Kyle Campbell, male    DOB: 1961-02-27, PCP Donita Brooks, MD.   Past Medical History:  Diagnosis Date   Asthma    Diabetes mellitus without complication (HCC)    Erectile dysfunction    GERD (gastroesophageal reflux disease)    Hyperlipidemia    Obstructive sleep apnea    compliant with CPAP   Testosterone deficiency     Past Surgical History:  Procedure Laterality Date   TONSILLECTOMY     age 16    Social History   Socioeconomic History   Marital status: Married    Spouse name: Not on file   Number of children: Not on file   Years of education: Not on file   Highest education level: Not on file  Occupational History   Not on file  Tobacco Use   Smoking status: Never   Smokeless tobacco: Never  Vaping Use   Vaping Use: Never used  Substance and Sexual Activity   Alcohol use: Yes    Alcohol/week: 6.0 standard drinks of alcohol    Types: 1 Standard drinks or equivalent, 5 Shots of liquor per week    Comment: weekend only   Drug use: No   Sexual activity: Yes    Comment: Married.    Other Topics Concern   Not on file  Social History Narrative   Has 3 children. One 55 year, and 43 year old twins. 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    Social Determinants of Health   Financial Resource Strain: Not on file  Food Insecurity: Not on file  Transportation Needs: Not on file  Physical Activity: Not on file  Stress: Not on file  Social Connections: Not on file    Family History  Problem Relation Age of Onset   Lung cancer Father     Outpatient Encounter Medications as of 03/18/2022  Medication Sig   albuterol (VENTOLIN HFA) 108 (90 Base) MCG/ACT inhaler INHALE 1 PUFF BY MOUTH EVERY FOUR HOURS AS NEEDED   Dulaglutide (TRULICITY) 1.5 MG/0.5ML SOPN Inject 1.5 mg into the skin once a week.   fluticasone-salmeterol (ADVAIR DISKUS) 250-50 MCG/ACT AEPB Inhale 1 puff  into the lungs 2 (two) times daily.   glucose blood (ACCU-CHEK AVIVA PLUS) test strip Use as instructed   Insulin Pen Needle 29G X MISC 1 Dose by Does not apply route every 14 (fourteen) days.   Lancets (ACCU-CHEK MULTICLIX) lancets Use as instructed   metFORMIN (GLUCOPHAGE) 500 MG tablet Take 1 tablet (500 mg total) by mouth 2 (two) times daily with a meal.   propranolol (INDERAL) 20 MG tablet Take 1 tablet (20 mg total) by mouth 2 (two) times daily.   sildenafil (VIAGRA) 100 MG tablet Take 0.5-1 tablets (50-100 mg total) by mouth daily as needed for erectile dysfunction.   traMADol (ULTRAM) 50 MG tablet Take 1 tablet (50 mg total) by mouth every 6 (six) hours as needed. for pain   UNABLE TO FIND CPAP mask - DX: OSA G47.33   albuterol (PROVENTIL) (2.5 MG/3ML) 0.083% nebulizer solution USE ONE VIAL VIA NEBULIZER EVERY 6 HOURS AS NEEDED FOR WHEEZING OR SHORTNESS OF BREATH.   No facility-administered encounter medications on file as of 03/18/2022.    ALLERGIES: No Known Allergies  VACCINATION STATUS: Immunization History  Administered Date(s) Administered   Influenza,inj,Quad PF,6+ Mos 04/08/2015, 01/19/2016, 04/21/2017, 03/01/2019  Influenza-Unspecified 05/20/2003   Td 04/26/2002   Tdap 02/02/2013     Kyle  ELIC Campbell is 61 y.o. male who presents today with a medical history as above. Kyle Campbell is being seen in consultation for hyperthyroidism requested by Donita Brooks, MD.  Kyle Campbell has been dealing with symptoms of unexplained weight loss, tremors, palpitations, heat intolerance, weakness, fatigue, irritability, dry eyes, dizziness, and diarrhea for about 6 months now. These symptoms are progressively worsening and troubling to him.   Kyle Campbell denies dysphagia, choking, shortness of breath, no recent voice change.    Kyle Campbell denies family history of thyroid dysfunction and denies family hx of thyroid cancer. Kyle Campbell does not know his family history on his fathers side.  Kyle Campbell denies personal history  of goiter. Kyle Campbell is not on any anti-thyroid medications nor on any thyroid hormone supplements. Denies use of Biotin containing supplements.  Kyle Campbell is willing to proceed with appropriate work up and therapy for thyrotoxicosis.  Kyle Campbell has never had any imaging of his thyroid in the past.   Review of systems  Constitutional: + decreasing body weight, current Body mass index is 28.26 kg/m., + fatigue, + subjective hyperthermia-improved, no subjective hypothermia Eyes: + blurry vision, + xerophthalmia ENT: no sore throat, no nodules palpated in throat, no dysphagia/odynophagia, no hoarseness Cardiovascular: no chest pain, no shortness of breath, + palpitations-improved, no leg swelling, easily bruised Respiratory: no cough, no shortness of breath Gastrointestinal: no nausea/vomiting, + diarrhea-improved Musculoskeletal: no muscle/joint aches Skin: no rashes, no hyperemia Neurological: + tremors-stable, no numbness, no tingling, + dizziness Psychiatric: no depression, no anxiety, + irritability-improved   Objective:    BP 104/68 (BP Location: Right Arm, Patient Position: Sitting, Cuff Size: Normal)   Pulse 80   Ht 6\' 1"  (1.854 m)   Wt 214 lb 3.2 oz (97.2 kg)   BMI 28.26 kg/m   Wt Readings from Last 3 Encounters:  03/18/22 214 lb 3.2 oz (97.2 kg)  02/18/22 217 lb (98.4 kg)  02/01/22 219 lb 3.2 oz (99.4 kg)     BP Readings from Last 3 Encounters:  03/18/22 104/68  02/18/22 113/69  02/01/22 124/72                      Physical Exam- Limited  Constitutional:  Body mass index is 28.26 kg/m. , not in acute distress, normal state of mind Eyes:  EOMI, no exophthalmos Neck: Supple Cardiovascular: RRR, no murmurs, rubs, or gallops, no edema Respiratory: Adequate breathing efforts, no crackles, rales, rhonchi, or wheezing Musculoskeletal: no gross deformities, strength intact in all four extremities, no gross restriction of joint movements Skin:  no rashes, no hyperemia Neurological: no  tremor with outstretched hands   CMP     Component Value Date/Time   NA 138 01/29/2022 0847   K 4.9 01/29/2022 0847   CL 101 01/29/2022 0847   CO2 27 01/29/2022 0847   GLUCOSE 315 (H) 01/29/2022 0847   BUN 11 01/29/2022 0847   CREATININE 0.98 01/29/2022 0847   CALCIUM 9.6 01/29/2022 0847   PROT 6.6 01/29/2022 0847   ALBUMIN 4.5 09/20/2016 1018   AST 14 01/29/2022 0847   ALT 23 01/29/2022 0847   ALKPHOS 72 09/20/2016 1018   BILITOT 0.5 01/29/2022 0847   GFRNONAA 73 09/19/2020 1622   GFRAA 84 09/19/2020 1622     CBC    Component Value Date/Time   WBC 8.4 10/26/2021 1644   RBC 4.84 10/26/2021 1644   HGB 14.9 10/26/2021 1644  HCT 43.3 10/26/2021 1644   PLT 204 10/26/2021 1644   MCV 89.5 10/26/2021 1644   MCH 30.8 10/26/2021 1644   MCHC 34.4 10/26/2021 1644   RDW 12.5 10/26/2021 1644   LYMPHSABS 2,839 10/26/2021 1644   MONOABS 380 09/20/2016 1018   EOSABS 529 (H) 10/26/2021 1644   BASOSABS 42 10/26/2021 1644     Diabetic Labs (most recent): Lab Results  Component Value Date   HGBA1C 9.2 (H) 01/29/2022   HGBA1C 6.1 (H) 10/26/2021   HGBA1C 5.9 (H) 08/03/2021   MICROALBUR <0.2 01/05/2021   MICROALBUR 0.2 04/18/2020   MICROALBUR <0.2 04/15/2017    Lipid Panel     Component Value Date/Time   CHOL 167 01/05/2021 0820   TRIG 258 (H) 01/05/2021 0820   HDL 38 (L) 01/05/2021 0820   CHOLHDL 4.4 01/05/2021 0820   VLDL 39 (H) 09/20/2016 1018   LDLCALC 95 01/05/2021 0820     Lab Results  Component Value Date   TSH <0.005 (L) 02/15/2022   TSH <0.005 (L) 01/22/2022   TSH <0.005 (L) 12/16/2021   TSH <0.01 (L) 10/26/2021   TSH 1.33 09/19/2020   TSH 1.36 01/13/2016   TSH 1.336 09/06/2014   TSH 1.570 10/17/2008   FREET4 1.39 02/15/2022   FREET4 1.76 01/22/2022   FREET4 2.73 (H) 12/16/2021   FREET4 1.6 11/03/2021   FREET4 5.3 10/17/2008      Latest Reference Range & Units 11/03/21 08:47 12/16/21 15:06 01/22/22 08:15 02/15/22 09:43  TSH 0.450 - 4.500 uIU/mL   <0.005 (L) <0.005 (L) <0.005 (L)  Triiodothyronine,Free,Serum 2.0 - 4.4 pg/mL 7.3 (H) 9.8 (H) 5.1 (H) 3.5  T4,Free(Direct) 0.82 - 1.77 ng/dL 1.6 3.66 (H) 4.40 3.47  Thyroperoxidase Ab SerPl-aCnc <9 IU/mL <1     THYROID STIMULATING IMMUNOGLOBULIN  Rpt !     (L): Data is abnormally low (H): Data is abnormally high !: Data is abnormal Rpt: View report in Results Review for more information  Uptake and Scan from 03/11/22 CLINICAL DATA:  Hyperthyroidism, weight loss, heat sensitivity, diarrhea, tremors, moodiness, increased thirst, palpitations, weakness, dry skin, brittle nails, suppressed TSH < 0.005   EXAM: THYROID SCAN AND UPTAKE - 4 AND 24 HOURS   TECHNIQUE: Following oral administration of I-123 capsule, anterior planar imaging was acquired at 24 hours. Thyroid uptake was calculated with a thyroid probe at 4-6 hours and 24 hours.   RADIOPHARMACEUTICALS:  453 uCi I-123 sodium iodide p.o.   COMPARISON:  None Available.   FINDINGS: Cold nodules identified at lateral aspect of inferior thyroid lobes bilaterally larger on LEFT.   No definite hot nodules.   4 hour I-123 uptake = 42.8% (normal 5-20%)   24 hour I-123 uptake = 57.5% (normal 10-30%)   IMPRESSION: Elevated 4 hour and 24 hour radio iodine uptakes consistent with hyperthyroidism.   Cold nodules at the inferior poles of both thyroid lobes, larger on LEFT; thyroid ultrasound assessment recommended prior to consideration of radioactive iodine therapy.     Electronically Signed   By: Ulyses Southward M.D.   On: 03/11/2022 11:08 Assessment & Plan:   1. Hyperthyroidism- d/t Graves disease 2. Thyroid antibody positive 3. Bilateral Cold thyroid nodules  Kyle Campbell is being seen at a kind request of Donita Brooks, MD.  his history and most recent labs are reviewed, and Kyle Campbell was examined clinically. Subjective and objective findings are consistent with thyrotoxicosis likely from primary hyperthyroidism. The potential risks of  untreated thyrotoxicosis and the need for definitive therapy have  been discussed in detail with him, and Kyle Campbell agrees to proceed with diagnostic workup and treatment plan.   His uptake and scan is consistent with Graves disease with elevated 4 and 24 hr uptakes.  Kyle Campbell did have 2 cold nodules in bilateral lower poles which warrant evaluation prior to RAI ablation with thyroid ultrasound.  Kyle Campbell agreed to proceed with this additional test.    In the meantime, Kyle Campbell can continue taking the Propanolol 20 mg po twice daily for symptom management.      -Patient is advised to maintain close follow up with Donita Brooks, MD for primary care needs.    I spent 20 minutes in the care of the patient today including review of labs from Thyroid Function, CMP, and other relevant labs ; imaging/biopsy records (current and previous including abstractions from other facilities); face-to-face time discussing  his lab results and symptoms, medications doses, his options of short and long term treatment based on the latest standards of care / guidelines;   and documenting the encounter.  Kyle Campbell  participated in the discussions, expressed understanding, and voiced agreement with the above plans.  All questions were answered to his satisfaction. Kyle Campbell is encouraged to contact clinic should Kyle Campbell have any questions or concerns prior to his return visit.  Follow up plan: Return in about 2 weeks (around 04/01/2022) for Thyroid follow up, thyroid ultrasound.   Thank you for involving me in the care of this pleasant patient, and I will continue to update you with his progress.   Ronny Bacon, [facility] [address] [place], [place] [address] Phone: [phone] Fax: [phone]  03/18/2022, 10:00 AM

## 2022-03-23 ENCOUNTER — Ambulatory Visit (HOSPITAL_COMMUNITY)
Admission: RE | Admit: 2022-03-23 | Discharge: 2022-03-23 | Disposition: A | Discharge status: Home / Self Care | Payer: BC Managed Care – PPO | Source: Ambulatory Visit | Attending: Nurse Practitioner | Admitting: Nurse Practitioner

## 2022-03-23 DIAGNOSIS — E041 Nontoxic single thyroid nodule: Secondary | ICD-10-CM | POA: Insufficient documentation

## 2022-03-23 DIAGNOSIS — E0590 Thyrotoxicosis, unspecified without thyrotoxic crisis or storm: Secondary | ICD-10-CM | POA: Diagnosis present

## 2022-03-23 DIAGNOSIS — R768 Other specified abnormal immunological findings in serum: Secondary | ICD-10-CM | POA: Insufficient documentation

## 2022-03-24 NOTE — Addendum Note (Signed)
Addended by: Dani Gobble on: 03/24/2022 07:29 AM   Modules accepted: Orders

## 2022-03-24 NOTE — Progress Notes (Signed)
Please let patient know that I reviewed his ultrasound and there is one spot (which corresponds with the cold nodule on his uptake and scan) which recommends biopsy to rule out malignancy.  Although it is rare to have both autoimmune thyroid disease (like Graves Disease) AND thyroid cancer, it is not impossible.  Before we move forward with any treatment, we still need to gather this data to understand exactly what all we are treating for.  I will enter the order for biopsy and radiology will call him to schedule appt.

## 2022-03-25 ENCOUNTER — Other Ambulatory Visit: Payer: Self-pay

## 2022-03-31 ENCOUNTER — Ambulatory Visit (HOSPITAL_COMMUNITY)
Admission: RE | Admit: 2022-03-31 | Discharge: 2022-03-31 | Disposition: A | Discharge status: Home / Self Care | Payer: BC Managed Care – PPO | Source: Ambulatory Visit | Attending: Nurse Practitioner | Admitting: Nurse Practitioner

## 2022-03-31 ENCOUNTER — Encounter (HOSPITAL_COMMUNITY): Payer: Self-pay

## 2022-03-31 DIAGNOSIS — E041 Nontoxic single thyroid nodule: Secondary | ICD-10-CM | POA: Diagnosis present

## 2022-04-05 ENCOUNTER — Ambulatory Visit: Payer: BC Managed Care – PPO | Admitting: Nurse Practitioner

## 2022-04-05 ENCOUNTER — Other Ambulatory Visit: Payer: Self-pay | Admitting: Family Medicine

## 2022-04-05 ENCOUNTER — Other Ambulatory Visit: Payer: Self-pay

## 2022-04-05 LAB — CYTOLOGY - NON PAP

## 2022-04-06 ENCOUNTER — Other Ambulatory Visit: Payer: Self-pay

## 2022-04-06 NOTE — Progress Notes (Signed)
Called patient and made him aware biopsy was sent to [facility] as they did detect some abnormal cells.  He is aware that we will call when we hear more.

## 2022-04-07 ENCOUNTER — Other Ambulatory Visit: Payer: Self-pay

## 2022-04-12 ENCOUNTER — Other Ambulatory Visit: Payer: Self-pay | Admitting: Family Medicine

## 2022-04-12 ENCOUNTER — Other Ambulatory Visit: Payer: Self-pay

## 2022-04-12 MED FILL — Tramadol HCl Tab 50 MG: ORAL | 23 days supply | Qty: 90 | Fill #0 | Status: AC

## 2022-04-16 ENCOUNTER — Encounter (HOSPITAL_COMMUNITY): Payer: Self-pay

## 2022-04-21 ENCOUNTER — Ambulatory Visit: Payer: BC Managed Care – PPO | Admitting: Nurse Practitioner

## 2022-04-21 ENCOUNTER — Encounter: Payer: Self-pay | Admitting: Nurse Practitioner

## 2022-04-21 VITALS — BP 112/69 | HR 82 | Ht 73.0 in | Wt 212.8 lb

## 2022-04-21 DIAGNOSIS — E0590 Thyrotoxicosis, unspecified without thyrotoxic crisis or storm: Secondary | ICD-10-CM

## 2022-04-21 DIAGNOSIS — E041 Nontoxic single thyroid nodule: Secondary | ICD-10-CM

## 2022-04-21 DIAGNOSIS — R768 Other specified abnormal immunological findings in serum: Secondary | ICD-10-CM

## 2022-04-21 NOTE — Progress Notes (Signed)
04/21/2022     Endocrinology Follow Up Note    Subjective:    Patient ID: Kyle Campbell, male    DOB: 1961-05-02, PCP Kyle Brooks, MD.   Past Medical History:  Diagnosis Date   Asthma    Diabetes mellitus without complication (HCC)    Erectile dysfunction    GERD (gastroesophageal reflux disease)    Hyperlipidemia    Obstructive sleep apnea    compliant with CPAP   Testosterone deficiency     Past Surgical History:  Procedure Laterality Date   TONSILLECTOMY     age 40    Social History   Socioeconomic History   Marital status: Married    Spouse name: Not on file   Number of children: Not on file   Years of education: Not on file   Highest education level: Not on file  Occupational History   Not on file  Tobacco Use   Smoking status: Never   Smokeless tobacco: Never  Vaping Use   Vaping Use: Never used  Substance and Sexual Activity   Alcohol use: Yes    Alcohol/week: 6.0 standard drinks of alcohol    Types: 1 Standard drinks or equivalent, 5 Shots of liquor per week    Comment: weekend only   Drug use: No   Sexual activity: Yes    Comment: Married.    Other Topics Concern   Not on file  Social History Narrative   Has 3 children. One 90 year, and 44 year old twins. 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    Social Determinants of Health   Financial Resource Strain: Not on file  Food Insecurity: Not on file  Transportation Needs: Not on file  Physical Activity: Not on file  Stress: Not on file  Social Connections: Not on file    Family History  Problem Relation Age of Onset   Lung cancer Father     Outpatient Encounter Medications as of 04/21/2022  Medication Sig   albuterol (VENTOLIN HFA) 108 (90 Base) MCG/ACT inhaler INHALE 1 PUFF BY MOUTH EVERY FOUR HOURS AS NEEDED   Dulaglutide (TRULICITY) 1.5 MG/0.5ML SOPN Inject 1.5 mg into the skin once a week.   fluticasone-salmeterol (ADVAIR DISKUS) 250-50 MCG/ACT AEPB Inhale 1 puff  into the lungs 2 (two) times daily.   glucose blood (ACCU-CHEK AVIVA PLUS) test strip Use as instructed   Insulin Pen Needle 29G X MISC 1 Dose by Does not apply route every 14 (fourteen) days.   Lancets (ACCU-CHEK MULTICLIX) lancets Use as instructed   metFORMIN (GLUCOPHAGE) 500 MG tablet Take 1 tablet (500 mg total) by mouth 2 (two) times daily with a meal.   propranolol (INDERAL) 20 MG tablet Take 1 tablet (20 mg total) by mouth 2 (two) times daily.   sildenafil (VIAGRA) 100 MG tablet Take 0.5-1 tablets (50-100 mg total) by mouth daily as needed for erectile dysfunction.   traMADol (ULTRAM) 50 MG tablet Take 1 tablet (50 mg total) by mouth every 6 (six) hours as needed. for pain   UNABLE TO FIND CPAP mask - DX: OSA G47.33   albuterol (PROVENTIL) (2.5 MG/3ML) 0.083% nebulizer solution USE ONE VIAL VIA NEBULIZER EVERY 6 HOURS AS NEEDED FOR WHEEZING OR SHORTNESS OF BREATH.   No facility-administered encounter medications on file as of 04/21/2022.    ALLERGIES: No Known Allergies  VACCINATION STATUS: Immunization History  Administered Date(s) Administered   Influenza,inj,Quad PF,6+ Mos 04/08/2015, 01/19/2016, 04/21/2017, 03/01/2019  Influenza-Unspecified 05/20/2003   Td 04/26/2002   Tdap 02/02/2013     HPI  Kyle Campbell is 61 y.o. male who presents today with a medical history as above. he is being seen in consultation for hyperthyroidism requested by Kyle Brooks, MD.  he has been dealing with symptoms of unexplained weight loss, tremors, palpitations, heat intolerance, weakness, fatigue, irritability, dry eyes, dizziness, and diarrhea for about 6 months now. These symptoms are progressively worsening and troubling to him.   he denies dysphagia, choking, shortness of breath, no recent voice change.    he denies family history of thyroid dysfunction and denies family hx of thyroid cancer. He does not know his family history on his fathers side.  he denies personal history  of goiter. he is not on any anti-thyroid medications nor on any thyroid hormone supplements. Denies use of Biotin containing supplements.  he is willing to proceed with appropriate work up and therapy for thyrotoxicosis.  He has never had any imaging of his thyroid in the past.   Review of systems  Constitutional: + decreasing body weight, current Body mass index is 28.08 kg/m., + fatigue, + subjective hyperthermia-improved, no subjective hypothermia Eyes: + blurry vision, + xerophthalmia ENT: no sore throat, no nodules palpated in throat, no dysphagia/odynophagia, no hoarseness Cardiovascular: no chest pain, no shortness of breath, + palpitations-improved, no leg swelling, easily bruised Respiratory: no cough, no shortness of breath Gastrointestinal: no nausea/vomiting, + diarrhea-improved Musculoskeletal: no muscle/joint aches Skin: no rashes, no hyperemia Neurological: + tremors-stable, no numbness, no tingling, + dizziness Psychiatric: no depression, no anxiety, + irritability-improved   Objective:    BP 112/69 (BP Location: Left Arm, Patient Position: Sitting, Cuff Size: Large)   Pulse 82   Ht 6\' 1"  (1.854 m)   Wt 212 lb 12.8 oz (96.5 kg)   BMI 28.08 kg/m   Wt Readings from Last 3 Encounters:  04/21/22 212 lb 12.8 oz (96.5 kg)  03/18/22 214 lb 3.2 oz (97.2 kg)  02/18/22 217 lb (98.4 kg)     BP Readings from Last 3 Encounters:  04/21/22 112/69  03/31/22 132/75  03/18/22 104/68                  Physical Exam- Limited  Constitutional:  Body mass index is 28.08 kg/m. , not in acute distress, normal state of mind Eyes:  EOMI, no exophthalmos Musculoskeletal: no gross deformities, strength intact in all four extremities, no gross restriction of joint movements Skin:  no rashes, no hyperemia Neurological: no tremor with outstretched hands   CMP     Component Value Date/Time   NA 138 01/29/2022 0847   K 4.9 01/29/2022 0847   CL 101 01/29/2022 0847   CO2 27  01/29/2022 0847   GLUCOSE 315 (H) 01/29/2022 0847   BUN 11 01/29/2022 0847   CREATININE 0.98 01/29/2022 0847   CALCIUM 9.6 01/29/2022 0847   PROT 6.6 01/29/2022 0847   ALBUMIN 4.5 09/20/2016 1018   AST 14 01/29/2022 0847   ALT 23 01/29/2022 0847   ALKPHOS 72 09/20/2016 1018   BILITOT 0.5 01/29/2022 0847   GFRNONAA 73 09/19/2020 1622   GFRAA 84 09/19/2020 1622     CBC    Component Value Date/Time   WBC 8.4 10/26/2021 1644   RBC 4.84 10/26/2021 1644   HGB 14.9 10/26/2021 1644   HCT 43.3 10/26/2021 1644   PLT 204 10/26/2021 1644   MCV 89.5 10/26/2021 1644   MCH 30.8 10/26/2021 1644  MCHC 34.4 10/26/2021 1644   RDW 12.5 10/26/2021 1644   LYMPHSABS 2,839 10/26/2021 1644   MONOABS 380 09/20/2016 1018   EOSABS 529 (H) 10/26/2021 1644   BASOSABS 42 10/26/2021 1644     Diabetic Labs (most recent): Lab Results  Component Value Date   HGBA1C 9.2 (H) 01/29/2022   HGBA1C 6.1 (H) 10/26/2021   HGBA1C 5.9 (H) 08/03/2021   MICROALBUR <0.2 01/05/2021   MICROALBUR 0.2 04/18/2020   MICROALBUR <0.2 04/15/2017    Lipid Panel     Component Value Date/Time   CHOL 167 01/05/2021 0820   TRIG 258 (H) 01/05/2021 0820   HDL 38 (L) 01/05/2021 0820   CHOLHDL 4.4 01/05/2021 0820   VLDL 39 (H) 09/20/2016 1018   LDLCALC 95 01/05/2021 0820     Lab Results  Component Value Date   TSH <0.005 (L) 02/15/2022   TSH <0.005 (L) 01/22/2022   TSH <0.005 (L) 12/16/2021   TSH <0.01 (L) 10/26/2021   TSH 1.33 09/19/2020   TSH 1.36 01/13/2016   TSH 1.336 09/06/2014   TSH 1.570 10/17/2008   FREET4 1.39 02/15/2022   FREET4 1.76 01/22/2022   FREET4 2.73 (H) 12/16/2021   FREET4 1.6 11/03/2021   FREET4 5.3 10/17/2008      Latest Reference Range & Units 11/03/21 08:47 12/16/21 15:06 01/22/22 08:15 02/15/22 09:43  TSH 0.450 - 4.500 uIU/mL  <0.005 (L) <0.005 (L) <0.005 (L)  Triiodothyronine,Free,Serum 2.0 - 4.4 pg/mL 7.3 (H) 9.8 (H) 5.1 (H) 3.5  T4,Free(Direct) 0.82 - 1.77 ng/dL 1.6 1.612.73 (H)  0.961.76 0.451.39  Thyroperoxidase Ab SerPl-aCnc <9 IU/mL <1     THYROID STIMULATING IMMUNOGLOBULIN  Rpt !     (L): Data is abnormally low (H): Data is abnormally high !: Data is abnormal Rpt: View report in Results Review for more information  Uptake and Scan from 03/11/22 CLINICAL DATA:  Hyperthyroidism, weight loss, heat sensitivity, diarrhea, tremors, moodiness, increased thirst, palpitations, weakness, dry skin, brittle nails, suppressed TSH < 0.005   EXAM: THYROID SCAN AND UPTAKE - 4 AND 24 HOURS   TECHNIQUE: Following oral administration of I-123 capsule, anterior planar imaging was acquired at 24 hours. Thyroid uptake was calculated with a thyroid probe at 4-6 hours and 24 hours.   RADIOPHARMACEUTICALS:  453 uCi I-123 sodium iodide p.o.   COMPARISON:  None Available.   FINDINGS: Cold nodules identified at lateral aspect of inferior thyroid lobes bilaterally larger on LEFT.   No definite hot nodules.   4 hour I-123 uptake = 42.8% (normal 5-20%)   24 hour I-123 uptake = 57.5% (normal 10-30%)   IMPRESSION: Elevated 4 hour and 24 hour radio iodine uptakes consistent with hyperthyroidism.   Cold nodules at the inferior poles of both thyroid lobes, larger on LEFT; thyroid ultrasound assessment recommended prior to consideration of radioactive iodine therapy.     Electronically Signed   By: Ulyses SouthwardMark  Boles M.D.   On: 03/11/2022 11:08       Assessment & Plan:   1. Hyperthyroidism- d/t Graves disease 2. Thyroid antibody positive 3. Bilateral Cold thyroid nodules  he is being seen at a kind request of Kyle BrooksPickard, Warren T, MD.  his history and most recent labs are reviewed, and he was examined clinically. Subjective and objective findings are consistent with thyrotoxicosis likely from primary hyperthyroidism. The potential risks of untreated thyrotoxicosis and the need for definitive therapy have been discussed in detail with him, and he agrees to proceed with diagnostic  workup and treatment plan.  His uptake and scan is consistent with Graves disease with elevated 4 and 24 hr uptakes.  He did have 2 cold nodules in bilateral lower poles which warrant evaluation prior to RAI ablation with thyroid ultrasound.  He agreed to proceed with this additional test with biopsy.  His thyroid biopsy was sent to [facility] and determined to be benign, with less than 4% chance of malignancy.  He is now ready to undergo treatment for his hyperthyroidism.  We talked about the process of RAI ablation, that he will need to separate himself from others for a period of time immediately following the procedure and that we will need to do blood testing 6 weeks after the procedure to assess response to treatment.  He agrees to proceed.  In the meantime, he can continue taking the Propanolol 20 mg po twice daily for symptom management.      -Patient is advised to maintain close follow up with Kyle Brooks, MD for primary care needs.    I spent 25 minutes in the care of the patient today including review of labs from Thyroid Function, CMP, and other relevant labs ; imaging/biopsy records (current and previous including abstractions from other facilities); face-to-face time discussing  his lab results and symptoms, medications doses, his options of short and long term treatment based on the latest standards of care / guidelines;   and documenting the encounter.  Kyle Campbell  participated in the discussions, expressed understanding, and voiced agreement with the above plans.  All questions were answered to his satisfaction. he is encouraged to contact clinic should he have any questions or concerns prior to his return visit.  Follow up plan: Return in about 2 months (around 06/22/2022) for Thyroid follow up, RAI ablation with labs 6 weeks post procedure.   Thank you for involving me in the care of this pleasant patient, and I will continue to update you with his  progress.   Ronny Bacon, [facility] [address] [place], [place] [address] Phone: [phone] Fax: [phone]  04/21/2022, 9:34 AM

## 2022-04-21 NOTE — Patient Instructions (Signed)
Radioiodine (I-131) Therapy for Hyperthyroidism  Radioiodine (I-131) therapy is a treatment for an overactive thyroid gland (hyperthyroidism). The thyroid is a gland in the neck that makes hormones that affect how the body processes food for energy (metabolism) and how the heart and brain function. This treatment involves swallowing a pill or liquid that contains I-131. I-131 is manufactured (synthetic) iodine that gives off radiation. After it is swallowed, the I-131 will be absorbed by the thyroid gland over the next few months. It will destroy thyroid cells and reverse hyperthyroidism. Tell a health care provider about: Any allergies you have. All medicines you are taking, including vitamins, herbs, eye drops, creams, and over-the-counter medicines. Any bleeding problems you have. Any surgeries you have had. Any medical conditions you have. Whether you are pregnant, may be pregnant, or have gone through menopause. The health care provider will also want to know: Whether you are breastfeeding. Whether you plan to have children in the next 2 years. Any contact you have with children or pregnant women. Your travel plans for the next 3 months. Whether you pass through radiation detectors for work or travel. What are the risks? Generally, this is a safe procedure. However, problems may occur, including: Damage to other structures or organs, such as the salivary glands. This could lead to dry mouth and loss of taste. Low sperm count. This may lead to temporary infertility. Sore throat or neck pain. This is temporary. Slightly increased risk of thyroid cancer. Nausea or vomiting. Hypothyroidism. This is a condition in which the thyroid gland does not make enough thyroid hormone. Radiation thyroiditis. This is the swelling of the thyroid gland that is caused by the radioiodine therapy. What happens before the procedure? When to stop eating and drinking Follow instructions from your health care  provider about what you may eat and drink. These may include: 2 hours before your procedure Stop eating all foods. Stop drinking all liquids. You may be allowed to take medicines with small sips of water. If you do not follow your health care provider's instructions, your procedure may be delayed or canceled. Medicines Ask your health care provider about: Changing or stopping your regular medicines. These include any diabetes medicines, blood thinners, or thyroid medicines. Taking over-the-counter medicines, vitamins, herbs, and supplements. General instructions Women may be asked to take a pregnancy test. Women who are breastfeeding should: Plan to stop at least 6 weeks before the procedure. Not go back to breastfeeding after the procedure until their health care provider approves. Plan to avoid contact with other people for 1 week after your treatment to protect others from exposure to radiation as it leaves your body. Avoiding contact with children and pregnant women is especially important. To do this, plan to stay home from work, arrange child care, and sleep alone. Plan to drive yourself home after treatment. Do not take public transportation. If you need someone to drive you home, sit as far away from the driver as possible. What happens during the procedure? You will be given a dose of I-131 to swallow. It may be a capsule or a liquid. Your thyroid gland will absorb the I-131 over the next 3 months. The treatment process will be complete in about 6 months. What happens after the procedure? You may need to stay in the hospital for 24 hours after your treatment. This depends on the requirements in your state. Follow instructions from your health care provider about: How to take care of yourself after the procedure. How to   protect others from exposure to radiation as it leaves your body. Summary Radioiodine (I-131) therapy is a treatment for an overactive thyroid gland  (hyperthyroidism). This treatment involves swallowing a capsule or liquid that contains I-131. I-131 is manufactured iodine that gives off radiation. Your thyroid gland will absorb the I-131 over the next 3 months. The I-131 destroys thyroid cells and reverses hyperthyroidism. Follow instructions from your health care provider about how to take care of yourself and how to protect other people from exposure to radiation after the procedure. This information is not intended to replace advice given to you by your health care provider. Make sure you discuss any questions you have with your health care provider. Document Revised: 06/19/2021 Document Reviewed: 06/19/2021 Elsevier Patient Education  2023 [employer]      Wait to have your labs drawn until 6 weeks after the ablation to assess response to treatment.   

## 2022-04-23 NOTE — Written Directive (Addendum)
MOLECULAR IMAGING AND THERAPEUTICS WRITTEN DIRECTIVE   PATIENT NAME: Kyle Campbell  PT DOB:   05/17/1960                                              MRN: [id number]  ---------------------------------------------------------------------------------------------------------------------   I-131 WHOLE THYROID THERAPY (NON-CANCER)    RADIOPHARMACEUTICAL:   Iodine-131 Capsule    PRESCRIBED DOSE FOR ADMINISTRATION:  18 mCi  (eighteen milliCuries)   ROUTE OFADMINISTRATION: PO   DIAGNOSIS:  Hyperthyroidism   REFERRING PHYSICIAN: Dr. Dede Query   TSH:    Lab Results  Component Value Date   TSH <0.005 (L) 02/15/2022   TSH <0.005 (L) 01/22/2022   TSH <0.005 (L) 12/16/2021     PRIOR I-131 THERAPY (Date and Dose): N/A   PRIOR RADIOLOGY EXAMS (Results and Date): US THYROID  Result Date: 03/23/2022 CLINICAL DATA:  61 year old male with history of cold thyroid nodules on nuclear medicine study. EXAM: THYROID ULTRASOUND TECHNIQUE: Ultrasound examination of the thyroid gland and adjacent soft tissues was performed. COMPARISON:  03/11/2022 FINDINGS: Parenchymal Echotexture: Markedly heterogeneous, no significant glandular hyperemia. Isthmus: 1.1 cm Right lobe: 5.9 x 2.3 x 2.5 cm Left lobe: 5.3 x 2.3 x 2.9 cm ________________________________________________________ Estimated total number of nodules >/= 1 cm: 3 Number of spongiform nodules >/=  2 cm not described below (TR1): 0 Number of mixed cystic and solid nodules >/= 1.5 cm not described below (TR2): 0 _________________________________________________________ Nodule # 1: Location: Isthmus; Inferior Maximum size: 1.3 cm; Other 2 dimensions: 1.2 x 1.1 cm Composition: solid/almost completely solid (2) Echogenicity: isoechoic (1) Shape: not taller-than-wide (0) Margins: smooth (0) Echogenic foci: peripheral calcifications (2) ACR TI-RADS total points: 5. ACR TI-RADS risk category: TR4 (4-6 points). ACR TI-RADS recommendations:  *Given size (>/= 1 - 1.4 cm) and appearance, a follow-up ultrasound in 1 year should be considered based on TI-RADS criteria. _________________________________________________________ Nodule # 2: Location: Left; Superior Maximum size: 1.5 cm; Other 2 dimensions: 1.5 x 1.1 cm Composition: mixed cystic and solid (1) Echogenicity: hypoechoic (2) Shape: not taller-than-wide (0) Margins: smooth (0) Echogenic foci: none (0) ACR TI-RADS total points: 3. ACR TI-RADS risk category: TR3 (3 points). ACR TI-RADS recommendations: *Given size (>/= 1.5 - 2.4 cm) and appearance, a follow-up ultrasound in 1 year should be considered based on TI-RADS criteria. _________________________________________________________ Nodule # 3: Location: Left; Inferior Maximum size: 2.2 cm; Other 2 dimensions: 1.9 x 1.8 cm Composition: solid/almost completely solid (2) Echogenicity: isoechoic (1) Shape: not taller-than-wide (0) Margins: smooth (0) Echogenic foci: none (0) ACR TI-RADS total points: 3. ACR TI-RADS risk category: TR3 (3 points). ACR TI-RADS recommendations: **Given size (>/= 2.5 cm) and appearance, fine needle aspiration of this mildly suspicious nodule should be considered based on TI-RADS criteria. _________________________________________________________ No cervical lymphadenopathy. IMPRESSION: 1. Enlarged, markedly heterogeneous thyroid without glandular hyperemia. These findings are nonspecific but could be seen with subacute to chronic stages thyroiditis or autoimmune thyroid disorder. 2. Solid nodule in the left inferior thyroid (labeled 3, 2.2 cm) corresponds to cold nodule on nuclear medicine study and meets criteria (TI-RADS category 3) for tissue sampling. Recommend ultrasound-guided fine-needle aspiration. 3. The left superior (labeled 2, 1.5 cm) and isthmus (labeled 1, 1.3 cm) nodules meet criteria (TI-RADS category 3 and TI-RADS category 4, respectively) for 1 year ultrasound surveillance. The above is in keeping with the  ACR TI-RADS recommendations - J  Am Coll Radiol 2017;14:587-595. Marliss Coots, MD [facility] Radiology Electronically Signed   By: Marliss Coots M.D.   On: 03/23/2022 15:50   NM THYROID MULT UPTAKE W/IMAGING  Result Date: 03/11/2022 CLINICAL DATA:  Hyperthyroidism, weight loss, heat sensitivity, diarrhea, tremors, moodiness, increased thirst, palpitations, weakness, dry skin, brittle nails, suppressed TSH < 0.005 EXAM: THYROID SCAN AND UPTAKE - 4 AND 24 HOURS TECHNIQUE: Following oral administration of I-123 capsule, anterior planar imaging was acquired at 24 hours. Thyroid uptake was calculated with a thyroid probe at 4-6 hours and 24 hours. RADIOPHARMACEUTICALS:  453 uCi I-123 sodium iodide p.o. COMPARISON:  None Available. FINDINGS: Cold nodules identified at lateral aspect of inferior thyroid lobes bilaterally larger on LEFT. No definite hot nodules. 4 hour I-123 uptake = 42.8% (normal 5-20%) 24 hour I-123 uptake = 57.5% (normal 10-30%) IMPRESSION: Elevated 4 hour and 24 hour radio iodine uptakes consistent with hyperthyroidism. Cold nodules at the inferior poles of both thyroid lobes, larger on LEFT; thyroid ultrasound assessment recommended prior to consideration of radioactive iodine therapy. Electronically Signed   By: Ulyses Southward M.D.   On: 03/11/2022 11:08      ADDITIONAL PHYSICIAN COMMENTS/NOTES   AUTHORIZED USER SIGNATURE & TIME STAMP:

## 2022-05-14 ENCOUNTER — Encounter (HOSPITAL_COMMUNITY)
Admission: RE | Admit: 2022-05-14 | Discharge: 2022-05-14 | Disposition: A | Discharge status: Home / Self Care | Payer: BC Managed Care – PPO | Source: Ambulatory Visit | Attending: Nurse Practitioner | Admitting: Nurse Practitioner

## 2022-05-14 DIAGNOSIS — E0590 Thyrotoxicosis, unspecified without thyrotoxic crisis or storm: Secondary | ICD-10-CM | POA: Diagnosis present

## 2022-05-14 MED ORDER — SODIUM IODIDE I 131 CAPSULE
16.7000 | Freq: Once | INTRAVENOUS | Status: AC | PRN
  Administered 2022-05-14: 16.7 via ORAL

## 2022-05-17 ENCOUNTER — Other Ambulatory Visit: Payer: Self-pay

## 2022-05-17 ENCOUNTER — Other Ambulatory Visit: Payer: Self-pay | Admitting: Family Medicine

## 2022-05-17 MED ORDER — TRAMADOL HCL 50 MG PO TABS
50.0000 mg | ORAL_TABLET | Freq: Four times a day (QID) | ORAL | 0 refills | Status: DC | PRN
  Filled 2022-05-17: qty 90, 23d supply, fill #0

## 2022-05-18 ENCOUNTER — Other Ambulatory Visit: Payer: Self-pay

## 2022-05-18 MED ORDER — FLUTICASONE-SALMETEROL 250-50 MCG/ACT IN AEPB
1.0000 | INHALATION_SPRAY | Freq: Two times a day (BID) | RESPIRATORY_TRACT | 4 refills | Status: DC
  Filled 2022-05-18: qty 60, 30d supply, fill #0
  Filled 2022-06-24: qty 60, 30d supply, fill #1
  Filled 2022-08-11: qty 60, 30d supply, fill #2

## 2022-05-18 NOTE — Telephone Encounter (Signed)
Requested Prescriptions  Pending Prescriptions Disp Refills   fluticasone-salmeterol (ADVAIR DISKUS) 250-50 MCG/ACT AEPB 60 each 3    Sig: Inhale 1 puff into the lungs 2 (two) times daily.     Pulmonology:  Combination Products Passed - 05/17/2022  8:15 AM      Passed - Valid encounter within last 12 months    Recent Outpatient Visits           9 months ago Controlled type 2 diabetes mellitus without complication, without long-term current use of insulin ([place])   [name] [name], Cammie Mcgee, MD   1 year ago Testosterone deficiency   [name] Susy Frizzle, MD   1 year ago Uncontrolled type 2 diabetes mellitus with hyperglycemia ([place])   [name] Susy Frizzle, MD   1 year ago Uncontrolled type 2 diabetes mellitus with hyperglycemia ([place])   [name] [name], Cammie Mcgee, MD   1 year ago Chronic fatigue   [name], Cammie Mcgee, MD       Future Appointments             In 2 months [name], Cammie Mcgee, MD [name]

## 2022-06-21 ENCOUNTER — Other Ambulatory Visit: Payer: Self-pay | Admitting: Family Medicine

## 2022-06-21 ENCOUNTER — Other Ambulatory Visit: Payer: Self-pay

## 2022-06-22 ENCOUNTER — Other Ambulatory Visit: Payer: Self-pay

## 2022-06-22 LAB — T4, FREE: Free T4: 2.65 ng/dL — ABNORMAL HIGH (ref 0.82–1.77)

## 2022-06-22 LAB — TSH: TSH: <0.005 u[IU]/mL — ABNORMAL LOW (ref 0.450–4.500)

## 2022-06-22 LAB — T3, FREE: T3, Free: 6.8 pg/mL — ABNORMAL HIGH (ref 2.0–4.4)

## 2022-06-22 MED ORDER — TRAMADOL HCL 50 MG PO TABS
50.0000 mg | ORAL_TABLET | Freq: Four times a day (QID) | ORAL | 0 refills | Status: DC | PRN
  Filled 2022-06-22: qty 90, 23d supply, fill #0

## 2022-06-23 ENCOUNTER — Encounter: Payer: Self-pay | Admitting: Nurse Practitioner

## 2022-06-23 ENCOUNTER — Ambulatory Visit: Payer: BC Managed Care – PPO | Admitting: Nurse Practitioner

## 2022-06-23 ENCOUNTER — Other Ambulatory Visit: Payer: Self-pay

## 2022-06-23 VITALS — BP 110/70 | HR 73 | Ht 73.0 in | Wt 209.2 lb

## 2022-06-23 DIAGNOSIS — E0590 Thyrotoxicosis, unspecified without thyrotoxic crisis or storm: Secondary | ICD-10-CM | POA: Diagnosis not present

## 2022-06-23 MED ORDER — METHIMAZOLE 5 MG PO TABS
5.0000 mg | ORAL_TABLET | Freq: Two times a day (BID) | ORAL | 0 refills | Status: DC
  Filled 2022-06-23: qty 60, 30d supply, fill #0

## 2022-06-23 NOTE — Patient Instructions (Signed)
The thyroid has not yet responded to the ablation therapy.  Sometimes it can take longer than 6 weeks to see an improvement in labs.  However, in the interim, starting back on the Methimazole will help to push the thyroid in the right direction.  Please take Methimazole 5 mg twice daily for 30 days to help slow the thyroid down.  Continue the Propanolol for symptom management as well.  We will repeat labs in 2 months to reassess response to treatment.  If thyroid still overactive, we may need to repeat the uptake and scan.

## 2022-06-23 NOTE — Progress Notes (Signed)
06/23/2022     Endocrinology Follow Up Note    Subjective:    Patient ID: Kyle Campbell, male    DOB: [birth year], PCP Susy Frizzle, MD.   Past Medical History:  Diagnosis Date   Asthma    Diabetes mellitus without complication ([place])    Erectile dysfunction    GERD (gastroesophageal reflux disease)    Hyperlipidemia    Obstructive sleep apnea    compliant with CPAP   Testosterone deficiency     Past Surgical History:  Procedure Laterality Date   TONSILLECTOMY     age 37    Social History   Socioeconomic History   Marital status: Married    Spouse name: Not on file   Number of children: Not on file   Years of education: Not on file   Highest education level: Not on file  Occupational History   Not on file  Tobacco Use   Smoking status: Never   Smokeless tobacco: Never  Vaping Use   Vaping Use: Never used  Substance and Sexual Activity   Alcohol use: Yes    Alcohol/week: 6.0 standard drinks of alcohol    Types: 1 Standard drinks or equivalent, 5 Shots of liquor per week    Comment: weekend only   Drug use: No   Sexual activity: Yes    Comment: Married.    Other Topics Concern   Not on file  Social History Narrative   Has 3 children. One 38 year, and 42 year old twins. Works as Administrator. Occasional  ETOH.    Social Determinants of Health   Financial Resource Strain: Not on file  Food Insecurity: Not on file  Transportation Needs: Not on file  Physical Activity: Not on file  Stress: Not on file  Social Connections: Not on file    Family History  Problem Relation Age of Onset   Lung cancer Father     Outpatient Encounter Medications as of 06/23/2022  Medication Sig   albuterol (VENTOLIN HFA) 108 (90 Base) MCG/ACT inhaler INHALE 1 PUFF BY MOUTH EVERY FOUR HOURS AS NEEDED   Dulaglutide (TRULICITY) 1.5 0000000 SOPN Inject 1.5 mg into the skin once a week.   fluticasone-salmeterol (ADVAIR DISKUS) 250-50 MCG/ACT AEPB Inhale 1 puff  into the lungs 2 (two) times daily.   glucose blood (ACCU-CHEK AVIVA PLUS) test strip Use as instructed   Insulin Pen Needle 29G X 12MM MISC 1 Dose by Does not apply route every 14 (fourteen) days.   Lancets (ACCU-CHEK MULTICLIX) lancets Use as instructed   metFORMIN (GLUCOPHAGE) 500 MG tablet Take 1 tablet (500 mg total) by mouth 2 (two) times daily with a meal.   methimazole (TAPAZOLE) 5 MG tablet Take 1 tablet (5 mg total) by mouth 2 (two) times daily.   propranolol (INDERAL) 20 MG tablet Take 1 tablet (20 mg total) by mouth 2 (two) times daily.   sildenafil (VIAGRA) 100 MG tablet Take 0.5-1 tablets (50-100 mg total) by mouth daily as needed for erectile dysfunction.   traMADol (ULTRAM) 50 MG tablet Take 1 tablet (50 mg total) by mouth every 6 (six) hours as needed. for pain   UNABLE TO FIND CPAP mask - DX: OSA G47.33   albuterol (PROVENTIL) (2.5 MG/3ML) 0.083% nebulizer solution USE ONE VIAL VIA NEBULIZER EVERY 6 HOURS AS NEEDED FOR WHEEZING OR SHORTNESS OF BREATH.   No facility-administered encounter medications on file as of 06/23/2022.    ALLERGIES: No Known Allergies  VACCINATION STATUS: Immunization History  Administered Date(s) Administered   Influenza,inj,Quad PF,6+ Mos 04/08/2015, 01/19/2016, 04/21/2017, 03/01/2019   Influenza-Unspecified 05/20/2003   Td 04/26/2002   Tdap 02/02/2013     HPI  Kyle Campbell is 62 y.o. male who presents today with a medical history as above. he is being seen in consultation for hyperthyroidism requested by Susy Frizzle, MD.  he has been dealing with symptoms of unexplained weight loss, tremors, palpitations, heat intolerance, weakness, fatigue, irritability, dry eyes, dizziness, and diarrhea for about 6 months now. These symptoms are progressively worsening and troubling to him.   he denies dysphagia, choking, shortness of breath, no recent voice change.    he denies family history of thyroid dysfunction and denies family hx of  thyroid cancer. He does not know his family history on his fathers side.  he denies personal history of goiter. he is not on any anti-thyroid medications nor on any thyroid hormone supplements. Denies use of Biotin containing supplements.  he is willing to proceed with appropriate work up and therapy for thyrotoxicosis.  He has never had any imaging of his thyroid in the past.   He is s/p RAI ablation on 05/14/22.   Review of systems  Constitutional: + decreasing body weight, current Body mass index is 27.6 kg/m., + fatigue-improved initially following the RAI treatment but gradually gotten worse again, + subjective hyperthermia-improved, no subjective hypothermia Eyes: + blurry vision, + xerophthalmia ENT: no sore throat, no nodules palpated in throat, no dysphagia/odynophagia, no hoarseness Cardiovascular: no chest pain, no shortness of breath, + palpitations-improved, no leg swelling, easily bruised Respiratory: no cough, no shortness of breath Gastrointestinal: no nausea/vomiting, + diarrhea-improved Musculoskeletal: no muscle/joint aches Skin: no rashes, no hyperemia Neurological: + tremors-stable, no numbness, no tingling, + dizziness Psychiatric: no depression, no anxiety, + irritability   Objective:    BP 110/70 (BP Location: Right Arm, Patient Position: Sitting, Cuff Size: Large)   Pulse 73   Ht 6' 1"$  (1.854 m)   Wt 209 lb 3.2 oz (94.9 kg)   BMI 27.60 kg/m   Wt Readings from Last 3 Encounters:  06/23/22 209 lb 3.2 oz (94.9 kg)  04/21/22 212 lb 12.8 oz (96.5 kg)  03/18/22 214 lb 3.2 oz (97.2 kg)     BP Readings from Last 3 Encounters:  06/23/22 110/70  04/21/22 112/69  03/31/22 132/75                  Physical Exam- Limited  Constitutional:  Body mass index is 27.6 kg/m. , not in acute distress, normal state of mind Eyes:  EOMI, no exophthalmos Musculoskeletal: no gross deformities, strength intact in all four extremities, no gross restriction of joint  movements Skin:  no rashes, no hyperemia Neurological: no tremor with outstretched hands   CMP     Component Value Date/Time   NA 138 01/29/2022 0847   K 4.9 01/29/2022 0847   CL 101 01/29/2022 0847   CO2 27 01/29/2022 0847   GLUCOSE 315 (H) 01/29/2022 0847   BUN 11 01/29/2022 0847   CREATININE 0.98 01/29/2022 0847   CALCIUM 9.6 01/29/2022 0847   PROT 6.6 01/29/2022 0847   ALBUMIN 4.5 09/20/2016 1018   AST 14 01/29/2022 0847   ALT 23 01/29/2022 0847   ALKPHOS 72 09/20/2016 1018   BILITOT 0.5 01/29/2022 0847   GFRNONAA 73 09/19/2020 1622   GFRAA 84 09/19/2020 1622     CBC    Component Value Date/Time   WBC  8.4 10/26/2021 1644   RBC 4.84 10/26/2021 1644   HGB 14.9 10/26/2021 1644   HCT 43.3 10/26/2021 1644   PLT 204 10/26/2021 1644   MCV 89.5 10/26/2021 1644   MCH 30.8 10/26/2021 1644   MCHC 34.4 10/26/2021 1644   RDW 12.5 10/26/2021 1644   LYMPHSABS 2,839 10/26/2021 1644   MONOABS 380 09/20/2016 1018   EOSABS 529 (H) 10/26/2021 1644   BASOSABS 42 10/26/2021 1644     Diabetic Labs (most recent): Lab Results  Component Value Date   HGBA1C 9.2 (H) 01/29/2022   HGBA1C 6.1 (H) 10/26/2021   HGBA1C 5.9 (H) 08/03/2021   MICROALBUR <0.2 01/05/2021   MICROALBUR 0.2 04/18/2020   MICROALBUR <0.2 04/15/2017    Lipid Panel     Component Value Date/Time   CHOL 167 01/05/2021 0820   TRIG 258 (H) 01/05/2021 0820   HDL 38 (L) 01/05/2021 0820   CHOLHDL 4.4 01/05/2021 0820   VLDL 39 (H) 09/20/2016 1018   LDLCALC 95 01/05/2021 0820     Lab Results  Component Value Date   TSH <0.005 (L) 06/21/2022   TSH <0.005 (L) 02/15/2022   TSH <0.005 (L) 01/22/2022   TSH <0.005 (L) 12/16/2021   TSH <0.01 (L) 10/26/2021   TSH 1.33 09/19/2020   TSH 1.36 01/13/2016   TSH 1.336 09/06/2014   TSH 1.570 10/17/2008   FREET4 2.65 (H) 06/21/2022   FREET4 1.39 02/15/2022   FREET4 1.76 01/22/2022   FREET4 2.73 (H) 12/16/2021   FREET4 1.6 11/03/2021   FREET4 5.3 10/17/2008       Uptake and Scan from 03/11/22 CLINICAL DATA:  Hyperthyroidism, weight loss, heat sensitivity, diarrhea, tremors, moodiness, increased thirst, palpitations, weakness, dry skin, brittle nails, suppressed TSH < 0.005   EXAM: THYROID SCAN AND UPTAKE - 4 AND 24 HOURS   TECHNIQUE: Following oral administration of I-123 capsule, anterior planar imaging was acquired at 24 hours. Thyroid uptake was calculated with a thyroid probe at 4-6 hours and 24 hours.   RADIOPHARMACEUTICALS:  453 uCi I-123 sodium iodide p.o.   COMPARISON:  None Available.   FINDINGS: Cold nodules identified at lateral aspect of inferior thyroid lobes bilaterally larger on LEFT.   No definite hot nodules.   4 hour I-123 uptake = 42.8% (normal 5-20%)   24 hour I-123 uptake = 57.5% (normal 10-30%)   IMPRESSION: Elevated 4 hour and 24 hour radio iodine uptakes consistent with hyperthyroidism.   Cold nodules at the inferior poles of both thyroid lobes, larger on LEFT; thyroid ultrasound assessment recommended prior to consideration of radioactive iodine therapy.     Electronically Signed   By: Lavonia Dana M.D.   On: 03/11/2022 11:08        Latest Reference Range & Units 12/16/21 15:06 01/22/22 08:15 02/15/22 09:43 06/21/22 08:40  TSH 0.450 - 4.500 uIU/mL <0.005 (L) <0.005 (L) <0.005 (L) <0.005 (L)  Triiodothyronine,Free,Serum 2.0 - 4.4 pg/mL 9.8 (H) 5.1 (H) 3.5 6.8 (H)  T4,Free(Direct) 0.82 - 1.77 ng/dL 2.73 (H) 1.76 1.39 2.65 (H)  (L): Data is abnormally low (H): Data is abnormally high   Assessment & Plan:   1. Hyperthyroidism- d/t Graves disease 2. Thyroid antibody positive 3. Bilateral Cold thyroid nodules  he is being seen at a kind request of Susy Frizzle, MD.  his history and most recent labs are reviewed, and he was examined clinically. Subjective and objective findings are consistent with thyrotoxicosis likely from primary hyperthyroidism. The potential risks of untreated  thyrotoxicosis and  the need for definitive therapy have been discussed in detail with him, and he agrees to proceed with diagnostic workup and treatment plan.   His uptake and scan is consistent with Graves disease with elevated 4 and 24 hr uptakes.  He did have 2 cold nodules in bilateral lower poles which warrant evaluation prior to RAI ablation with thyroid ultrasound.  He agreed to proceed with this additional test with biopsy.  His thyroid biopsy was sent to [facility] and determined to be benign, with less than 4% chance of malignancy.  He underwent RAI ablation for hyperthyroidism on 05/14/22.  His repeat labs show worsening thyroid function (overactive) which can sometimes be seen following RAI ablation as his bloodstream gets flooded with the remaining thyroid hormone, however given he is 6 weeks or so out from treatment, I would have expected some improvement.  He also notes immediately following his RAI, he did have temporary worsening of his symptoms for about 3 days with high HR, insomnia, irritability which has gotten better.  We discussed the need to go back on antithyroid treatment with Methimazole 5 mg po twice daily for about a month to aid his thyroid in shutting down and preventing adverse outcomes.  In the meantime, he can continue taking the Propanolol 20 mg po twice daily for symptom management.  Will recheck labs again prior to next visit and if thyroid function has still not improved, may consider repeating uptake and scan.      -Patient is advised to maintain close follow up with Susy Frizzle, MD for primary care needs.     I spent  36  minutes in the care of the patient today including review of labs from [place], Lipids, Thyroid Function, Hematology (current and previous including abstractions from other facilities); face-to-face time discussing  his blood glucose readings/logs, discussing hypoglycemia and hyperglycemia episodes and symptoms, medications doses, his options  of short and long term treatment based on the latest standards of care / guidelines;  discussion about incorporating lifestyle medicine;  and documenting the encounter. Risk reduction counseling performed per USPSTF guidelines to reduce obesity and cardiovascular risk factors.     Please refer to Patient Instructions for Blood Glucose Monitoring and Insulin/Medications Dosing Guide"  in media tab for additional information. Please  also refer to " Patient Self Inventory" in the Media  tab for reviewed elements of pertinent patient history.  Kyle Campbell participated in the discussions, expressed understanding, and voiced agreement with the above plans.  All questions were answered to his satisfaction. he is encouraged to contact clinic should he have any questions or concerns prior to his return visit.  Follow up plan: Return in about 2 months (around 08/22/2022) for Thyroid follow up, Previsit labs.   Thank you for involving me in the care of this pleasant patient, and I will continue to update you with his progress.   Rayetta Pigg, [facility] [address] [place], [place] [address] Phone: [phone] Fax: [phone]  06/23/2022, 12:54 PM

## 2022-06-24 ENCOUNTER — Other Ambulatory Visit: Payer: Self-pay

## 2022-06-28 ENCOUNTER — Ambulatory Visit: Payer: BC Managed Care – PPO | Admitting: Family Medicine

## 2022-06-28 ENCOUNTER — Other Ambulatory Visit: Payer: Self-pay

## 2022-06-28 ENCOUNTER — Encounter: Payer: Self-pay | Admitting: Family Medicine

## 2022-06-28 VITALS — BP 118/58 | HR 95 | Temp 98.7°F | Ht 73.0 in | Wt 210.0 lb

## 2022-06-28 DIAGNOSIS — E119 Type 2 diabetes mellitus without complications: Secondary | ICD-10-CM

## 2022-06-28 MED ORDER — SILDENAFIL CITRATE 100 MG PO TABS
50.0000 mg | ORAL_TABLET | Freq: Every day | ORAL | 11 refills | Status: DC | PRN
  Filled 2022-06-28: qty 10, 10d supply, fill #0

## 2022-06-28 NOTE — Progress Notes (Signed)
Subjective:    Patient ID: Kyle Campbell, male    DOB: 1961-01-22, 62 y.o.   MRN: [id number] When I saw the patient last September, his hemoglobin A1c was greater than 9.  However he was also diagnosed with Graves' disease/hyperthyroidism.  Since that visit, he has undergone radioactive iodine ablation.  He is currently on methimazole.  He also resumed his Trulicity as well as his metformin.  He does report polyuria.  He also reports polydipsia.  Continues to lose weight Wt Readings from Last 3 Encounters:  06/28/22 210 lb (95.3 kg)  06/23/22 209 lb 3.2 oz (94.9 kg)  04/21/22 212 lb 12.8 oz (96.5 kg)   He is currently on propranolol for tremor related to his hypothyroidism as well as the methimazole.  He states that he just started the methimazole yesterday so he has yet to have adequate time to see if the medication is helping.  His TSH was still undetectable last week.  Endocrinology plans to use methimazole and then recheck the patient in 2 months.  At that time, they will discuss repeating the radioactive iodine ablation if necessary.  Patient denies any chest pain shortness of breath or dyspnea on exertion Past Medical History:  Diagnosis Date   Asthma    Diabetes mellitus without complication ([facility])    Erectile dysfunction    GERD (gastroesophageal reflux disease)    Hyperlipidemia    Obstructive sleep apnea    compliant with CPAP   Testosterone deficiency    Past Surgical History:  Procedure Laterality Date   TONSILLECTOMY     age 39   Current Outpatient Medications on File Prior to Visit  Medication Sig Dispense Refill   albuterol (VENTOLIN HFA) 108 (90 Base) MCG/ACT inhaler INHALE 1 PUFF BY MOUTH EVERY FOUR HOURS AS NEEDED 18 g 3   Dulaglutide (TRULICITY) 1.5 0000000 SOPN Inject 1.5 mg into the skin once a week. 6 mL 3   fluticasone-salmeterol (ADVAIR DISKUS) 250-50 MCG/ACT AEPB Inhale 1 puff into the lungs 2 (two) times daily. 60 each 4   glucose blood (ACCU-CHEK AVIVA  PLUS) test strip Use as instructed 100 each 12   Insulin Pen Needle 29G X 12MM MISC 1 Dose by Does not apply route every 14 (fourteen) days. 30 each 3   Lancets (ACCU-CHEK MULTICLIX) lancets Use as instructed 100 each 12   metFORMIN (GLUCOPHAGE) 500 MG tablet Take 1 tablet (500 mg total) by mouth 2 (two) times daily with a meal. 180 tablet 3   methimazole (TAPAZOLE) 5 MG tablet Take 1 tablet (5 mg total) by mouth 2 (two) times daily. 60 tablet 0   propranolol (INDERAL) 20 MG tablet Take 1 tablet (20 mg total) by mouth 2 (two) times daily. 180 tablet 1   traMADol (ULTRAM) 50 MG tablet Take 1 tablet (50 mg total) by mouth every 6 (six) hours as needed. for pain 90 tablet 0   UNABLE TO FIND CPAP mask - DX: OSA G47.33 1 Device 0   No current facility-administered medications on file prior to visit.       No Known Allergies Social History   Socioeconomic History   Marital status: Married    Spouse name: Not on file   Number of children: Not on file   Years of education: Not on file   Highest education level: Not on file  Occupational History   Not on file  Tobacco Use   Smoking status: Never   Smokeless tobacco: Never  Vaping Use  Vaping Use: Never used  Substance and Sexual Activity   Alcohol use: Yes    Alcohol/week: 6.0 standard drinks of alcohol    Types: 1 Standard drinks or equivalent, 5 Shots of liquor per week    Comment: weekend only   Drug use: No   Sexual activity: Yes    Comment: Married.    Other Topics Concern   Not on file  Social History Narrative   Has 3 children. One 74 year, and 11 year old twins. Works as Administrator. Occasional  ETOH.    Social Determinants of Health   Financial Resource Strain: Not on file  Food Insecurity: Not on file  Transportation Needs: Not on file  Physical Activity: Not on file  Stress: Not on file  Social Connections: Not on file  Intimate Partner Violence: Not on file      Review of Systems  All other systems  reviewed and are negative.      Objective:   Physical Exam Constitutional:      General: He is not in acute distress.    Appearance: He is well-developed. He is not diaphoretic.  Eyes:     Conjunctiva/sclera: Conjunctivae normal.     Pupils: Pupils are equal, round, and reactive to light.  Neck:     Thyroid: No thyromegaly.     Vascular: No JVD.  Cardiovascular:     Rate and Rhythm: Normal rate and regular rhythm.     Heart sounds: Normal heart sounds. No murmur heard.    No friction rub. No gallop.  Pulmonary:     Effort: Pulmonary effort is normal. No respiratory distress.     Breath sounds: No decreased air movement. No wheezing, rhonchi or rales.  Abdominal:     General: Bowel sounds are normal. There is no distension.     Palpations: Abdomen is soft.     Tenderness: There is no abdominal tenderness. There is no rebound.  Genitourinary:    Prostate: Normal.     Rectum: Normal.  Musculoskeletal:     Cervical back: Neck supple.  Lymphadenopathy:     Cervical: No cervical adenopathy.  Neurological:     Mental Status: He is alert and oriented to person, place, and time.     Cranial Nerves: No cranial nerve deficit.     Motor: No abnormal muscle tone.     Coordination: Coordination normal.     Deep Tendon Reflexes: Reflexes are normal and symmetric.           Assessment & Plan:  Controlled type 2 diabetes mellitus without complication, without long-term current use of insulin ([place]) - Plan: Hemoglobin A1c, COMPLETE METABOLIC PANEL WITH GFR, Protein / Creatinine Ratio, Urine Repeat a hemoglobin A1c today along with a CMP and a urine protein to creatinine ratio.  He is not fasting so I cannot check his cholesterol.  His blood pressure is excellent.  Ideally I like to see his A1c less than 6.5.  If not, we can uptitrate his metformin and potentially add Jardiance.  Defer management of his Graves' disease to his endocrinologist.

## 2022-06-29 LAB — COMPLETE METABOLIC PANEL WITH GFR
AG Ratio: 1.8 (calc) (ref 1.0–2.5)
ALT: 22 U/L (ref 9–46)
AST: 14 U/L (ref 10–35)
Albumin: 4.1 g/dL (ref 3.6–5.1)
Alkaline phosphatase (APISO): 94 U/L (ref 35–144)
BUN: 12 mg/dL (ref 7–25)
CO2: 30 mmol/L (ref 20–32)
Calcium: 9.3 mg/dL (ref 8.6–10.3)
Chloride: 105 mmol/L (ref 98–110)
Creat: 0.91 mg/dL (ref 0.70–1.35)
Globulin: 2.3 g/dL (calc) (ref 1.9–3.7)
Glucose, Bld: 118 mg/dL — ABNORMAL HIGH (ref 65–99)
Potassium: 4.3 mmol/L (ref 3.5–5.3)
Sodium: 141 mmol/L (ref 135–146)
Total Bilirubin: 0.4 mg/dL (ref 0.2–1.2)
Total Protein: 6.4 g/dL (ref 6.1–8.1)
eGFR: 96 mL/min/{1.73_m2} (ref >=60)

## 2022-06-29 LAB — HEMOGLOBIN A1C
Hgb A1c MFr Bld: 7 % of total Hgb — ABNORMAL HIGH (ref <5.7)
Mean Plasma Glucose: 154 mg/dL
eAG (mmol/L): 8.5 mmol/L

## 2022-06-29 LAB — PROTEIN / CREATININE RATIO, URINE
Creatinine, Urine: 97 mg/dL (ref 20–320)
Protein/Creat Ratio: 93 mg/g creat (ref 25–148)
Protein/Creatinine Ratio: 0.093 mg/mg creat (ref 0.025–0.148)
Total Protein, Urine: 9 mg/dL (ref 5–25)

## 2022-07-13 ENCOUNTER — Other Ambulatory Visit: Payer: Self-pay

## 2022-07-26 ENCOUNTER — Other Ambulatory Visit: Payer: Self-pay | Admitting: Nurse Practitioner

## 2022-07-26 ENCOUNTER — Other Ambulatory Visit: Payer: Self-pay | Admitting: Family Medicine

## 2022-07-26 ENCOUNTER — Other Ambulatory Visit: Payer: Self-pay

## 2022-07-26 DIAGNOSIS — E0590 Thyrotoxicosis, unspecified without thyrotoxic crisis or storm: Secondary | ICD-10-CM

## 2022-07-27 ENCOUNTER — Other Ambulatory Visit: Payer: Self-pay

## 2022-07-27 MED FILL — Propranolol HCl Tab 20 MG: ORAL | 30 days supply | Qty: 60 | Fill #0 | Status: AC

## 2022-07-27 MED FILL — Methimazole Tab 5 MG: ORAL | 30 days supply | Qty: 60 | Fill #0 | Status: AC

## 2022-07-29 ENCOUNTER — Other Ambulatory Visit: Payer: Self-pay | Admitting: Family Medicine

## 2022-07-29 ENCOUNTER — Other Ambulatory Visit: Payer: Self-pay

## 2022-07-29 ENCOUNTER — Telehealth: Payer: Self-pay

## 2022-07-29 ENCOUNTER — Other Ambulatory Visit: Payer: BC Managed Care – PPO

## 2022-07-29 DIAGNOSIS — E119 Type 2 diabetes mellitus without complications: Secondary | ICD-10-CM

## 2022-07-29 DIAGNOSIS — E785 Hyperlipidemia, unspecified: Secondary | ICD-10-CM

## 2022-07-29 MED ORDER — TRAMADOL HCL 50 MG PO TABS
50.0000 mg | ORAL_TABLET | Freq: Four times a day (QID) | ORAL | 0 refills | Status: DC | PRN
  Filled 2022-07-29: qty 90, 23d supply, fill #0

## 2022-07-29 NOTE — Telephone Encounter (Signed)
Requested medication (s) are due for refill today - provider review  Requested medication (s) are on the active medication list -yes  Future visit scheduled -yes  Last refill: 06/22/22 #90  Notes to clinic: non delegated Rx  Requested Prescriptions  Pending Prescriptions Disp Refills   traMADol (ULTRAM) 50 MG tablet 90 tablet 0    Sig: Take 1 tablet (50 mg total) by mouth every 6 (six) hours as needed. for pain     Not Delegated - Analgesics:  Opioid Agonists Failed - 07/29/2022 10:59 AM      Failed - This refill cannot be delegated      Failed - Urine Drug Screen completed in last 360 days      Failed - Valid encounter within last 3 months    Recent Outpatient Visits           11 months ago Controlled type 2 diabetes mellitus without complication, without long-term current use of insulin ([place])   [place] [name], Cammie Mcgee, MD   1 year ago Testosterone deficiency   [place] Susy Frizzle, MD   1 year ago Uncontrolled type 2 diabetes mellitus with hyperglycemia ([place])   [place] Susy Frizzle, MD   1 year ago Uncontrolled type 2 diabetes mellitus with hyperglycemia ([place])   [place] [name], Cammie Mcgee, MD   1 year ago Chronic fatigue   [place], Cammie Mcgee, MD       Future Appointments             In 4 days Susy Frizzle, MD [facility]               Requested Prescriptions  Pending Prescriptions Disp Refills   traMADol (ULTRAM) 50 MG tablet 90 tablet 0    Sig: Take 1 tablet (50 mg total) by mouth every 6 (six) hours as needed. for pain     Not Delegated - Analgesics:  Opioid Agonists Failed - 07/29/2022 10:59 AM      Failed - This refill cannot be delegated      Failed - Urine Drug Screen completed in last 360 days      Failed - Valid encounter within last 3 months    Recent Outpatient Visits           11  months ago Controlled type 2 diabetes mellitus without complication, without long-term current use of insulin ([place])   [place] [name], Cammie Mcgee, MD   1 year ago Testosterone deficiency   [place] Susy Frizzle, MD   1 year ago Uncontrolled type 2 diabetes mellitus with hyperglycemia ([place])   [place] Susy Frizzle, MD   1 year ago Uncontrolled type 2 diabetes mellitus with hyperglycemia ([place])   [place] [name], Cammie Mcgee, MD   1 year ago Chronic fatigue   [place] [name], Cammie Mcgee, MD       Future Appointments             In 4 days [name], Cammie Mcgee, MD [place], [facility]

## 2022-07-29 NOTE — Telephone Encounter (Signed)
Call from [facility], Pharmacist at [place] [facility] stating pt's Tramadol was denied stating reason was, "filled by other means." Pam states that they have never requested another refill or received a refill on the patient's medication. Pam asks if a refill can be sent for the patient? I doubled checked in [employer] and it looks like the last RF was sent on 06/22/2022. Thanks.

## 2022-07-30 LAB — LIPID PANEL
Cholesterol: 166 mg/dL (ref <200)
HDL: 38 mg/dL — ABNORMAL LOW (ref >=40)
LDL Cholesterol (Calc): 105 mg/dL (calc) — ABNORMAL HIGH
Non-HDL Cholesterol (Calc): 128 mg/dL (calc) (ref <130)
Total CHOL/HDL Ratio: 4.4 (calc) (ref <5.0)
Triglycerides: 134 mg/dL (ref <150)

## 2022-07-30 LAB — CBC WITH DIFFERENTIAL/PLATELET
Absolute Monocytes: 420 cells/uL (ref 200–950)
Basophils Absolute: 49 cells/uL (ref 0–200)
Basophils Relative: 0.7 %
Eosinophils Absolute: 350 cells/uL (ref 15–500)
Eosinophils Relative: 5 %
HCT: 48 % (ref 38.5–50.0)
Hemoglobin: 15.8 g/dL (ref 13.2–17.1)
Lymphs Abs: 1813 cells/uL (ref 850–3900)
MCH: 29.5 pg (ref 27.0–33.0)
MCHC: 32.9 g/dL (ref 32.0–36.0)
MCV: 89.6 fL (ref 80.0–100.0)
MPV: 9.8 fL (ref 7.5–12.5)
Monocytes Relative: 6 %
Neutro Abs: 4368 cells/uL (ref 1500–7800)
Neutrophils Relative %: 62.4 %
Platelets: 213 10*3/uL (ref 140–400)
RBC: 5.36 10*6/uL (ref 4.20–5.80)
RDW: 13.7 % (ref 11.0–15.0)
Total Lymphocyte: 25.9 %
WBC: 7 10*3/uL (ref 3.8–10.8)

## 2022-07-30 LAB — MICROALBUMIN / CREATININE URINE RATIO
Creatinine, Urine: 205 mg/dL (ref 20–320)
Microalb Creat Ratio: 3 mg/g creat (ref <30)
Microalb, Ur: 0.6 mg/dL

## 2022-08-02 ENCOUNTER — Ambulatory Visit: Payer: BC Managed Care – PPO | Admitting: Family Medicine

## 2022-08-02 ENCOUNTER — Encounter: Payer: Self-pay | Admitting: Family Medicine

## 2022-08-02 VITALS — BP 132/64 | HR 77 | Ht 73.0 in | Wt 217.0 lb

## 2022-08-02 DIAGNOSIS — E118 Type 2 diabetes mellitus with unspecified complications: Secondary | ICD-10-CM

## 2022-08-02 NOTE — Progress Notes (Signed)
Subjective:    Patient ID: Kyle Campbell, male    DOB: [birth year], 62 y.o.   MRN: [id number]  Patient is a 62 year old Caucasian male with a history of type 2 diabetes.  His most recent hemoglobin A1c was 7.0.  However the patient is not currently taking a statin.  His most recent lab work is shown below Lab on 07/29/2022  Component Date Value Ref Range Status   WBC 07/29/2022 7.0  3.8 - 10.8 Thousand/uL Final   RBC 07/29/2022 5.36  4.20 - 5.80 Million/uL Final   Hemoglobin 07/29/2022 15.8  13.2 - 17.1 g/dL Final   HCT 07/29/2022 48.0  38.5 - 50.0 % Final   MCV 07/29/2022 89.6  80.0 - 100.0 fL Final   MCH 07/29/2022 29.5  27.0 - 33.0 pg Final   MCHC 07/29/2022 32.9  32.0 - 36.0 g/dL Final   RDW 07/29/2022 13.7  11.0 - 15.0 % Final   Platelets 07/29/2022 213  140 - 400 Thousand/uL Final   MPV 07/29/2022 9.8  7.5 - 12.5 fL Final   Neutro Abs 07/29/2022 4,368  1,500 - 7,800 cells/uL Final   Lymphs Abs 07/29/2022 1,813  850 - 3,900 cells/uL Final   Absolute Monocytes 07/29/2022 420  200 - 950 cells/uL Final   Eosinophils Absolute 07/29/2022 350  15 - 500 cells/uL Final   Basophils Absolute 07/29/2022 49  0 - 200 cells/uL Final   Neutrophils Relative % 07/29/2022 62.4  % Final   Total Lymphocyte 07/29/2022 25.9  % Final   Monocytes Relative 07/29/2022 6.0  % Final   Eosinophils Relative 07/29/2022 5.0  % Final   Basophils Relative 07/29/2022 0.7  % Final   Cholesterol 07/29/2022 166  <200 mg/dL Final   HDL 07/29/2022 38 (L)  > OR = 40 mg/dL Final   Triglycerides 07/29/2022 134  <150 mg/dL Final   LDL Cholesterol (Calc) 07/29/2022 105 (H)  mg/dL (calc) Final   Comment: Reference range: <100 . Desirable range <100 mg/dL for primary prevention;   <70 mg/dL for patients with CHD or diabetic patients  with > or = 2 CHD risk factors. Marland Kitchen LDL-C is now calculated using the Martin-Hopkins  calculation, which is a validated novel method providing  better accuracy than the Friedewald  equation in the  estimation of LDL-C.  Cresenciano Genre et al. Annamaria Helling. [id number]): 2061-2068  (http://education.[facility].com/faq/FAQ164)    Total CHOL/HDL Ratio 07/29/2022 4.4  <5.0 (calc) Final   Non-HDL Cholesterol (Calc) 07/29/2022 128  <130 mg/dL (calc) Final   Comment: For patients with diabetes plus 1 major ASCVD risk  factor, treating to a non-HDL-C goal of <100 mg/dL  (LDL-C of <70 mg/dL) is considered a therapeutic  option.    Creatinine, Urine 07/29/2022 205  20 - 320 mg/dL Final   Microalb, Ur 07/29/2022 0.6  mg/dL Final   Comment: Reference Range Not established    Microalb Creat Ratio 07/29/2022 3  <30 mg/g creat Final   Comment: . The ADA defines abnormalities in albumin excretion as follows: Marland Kitchen Albuminuria Category        Result (mg/g creatinine) . Normal to Mildly increased   <30 Moderately increased         30-299  Severely increased           > OR = 300 . The ADA recommends that at least two of three specimens collected within a 3-6 month period be abnormal before considering a patient to be within a diagnostic category.  Patient is here today to review his labs.  I explained to the patient that they recommend every diabetic patient be on a statin.  However he is hesitant to take additional medication given the fact that his cholesterol is not elevated.  He denies any chest pain shortness of breath or dyspnea on exertion  Past Medical History:  Diagnosis Date   Asthma    Diabetes mellitus without complication (HCC)    Erectile dysfunction    GERD (gastroesophageal reflux disease)    Hyperlipidemia    Obstructive sleep apnea    compliant with CPAP   Testosterone deficiency    Past Surgical History:  Procedure Laterality Date   TONSILLECTOMY     age 62   Current Outpatient Medications on File Prior to Visit  Medication Sig Dispense Refill   albuterol (VENTOLIN HFA) 108 (90 Base) MCG/ACT inhaler INHALE 1 PUFF BY MOUTH EVERY FOUR HOURS AS NEEDED 18  g 3   Dulaglutide (TRULICITY) 1.5 0000000 SOPN Inject 1.5 mg into the skin once a week. 6 mL 3   fluticasone-salmeterol (ADVAIR DISKUS) 250-50 MCG/ACT AEPB Inhale 1 puff into the lungs 2 (two) times daily. 60 each 4   glucose blood (ACCU-CHEK AVIVA PLUS) test strip Use as instructed 100 each 12   Insulin Pen Needle 29G X 12MM MISC 1 Dose by Does not apply route every 14 (fourteen) days. 30 each 3   Lancets (ACCU-CHEK MULTICLIX) lancets Use as instructed 100 each 12   metFORMIN (GLUCOPHAGE) 500 MG tablet Take 1 tablet (500 mg total) by mouth 2 (two) times daily with a meal. 180 tablet 3   methimazole (TAPAZOLE) 5 MG tablet Take 1 tablet (5 mg total) by mouth 2 (two) times daily. 60 tablet 0   propranolol (INDERAL) 20 MG tablet Take 1 tablet (20 mg total) by mouth 2 (two) times daily. 60 tablet 0   sildenafil (VIAGRA) 100 MG tablet Take 1/2-1 tablets (50-100 mg total) by mouth daily as needed for erectile dysfunction. 5 tablet 11   traMADol (ULTRAM) 50 MG tablet Take 1 tablet (50 mg total) by mouth every 6 (six) hours as needed. for pain 90 tablet 0   UNABLE TO FIND CPAP mask - DX: OSA G47.33 1 Device 0   No current facility-administered medications on file prior to visit.       No Known Allergies Social History   Socioeconomic History   Marital status: Married    Spouse name: Not on file   Number of children: Not on file   Years of education: Not on file   Highest education level: 12th grade  Occupational History   Not on file  Tobacco Use   Smoking status: Never   Smokeless tobacco: Never  Vaping Use   Vaping Use: Never used  Substance and Sexual Activity   Alcohol use: Yes    Alcohol/week: 6.0 standard drinks of alcohol    Types: 1 Standard drinks or equivalent, 5 Shots of liquor per week    Comment: weekend only   Drug use: No   Sexual activity: Yes    Comment: Married.    Other Topics Concern   Not on file  Social History Narrative   Has 3 children. One 33 year, and  45 year old twins. Works as Administrator. Occasional  ETOH.    Social Determinants of Health   Financial Resource Strain: Low Risk  (08/01/2022)   Overall Financial Resource Strain (CARDIA)    Difficulty of Paying Living Expenses:  Not hard at all  Food Insecurity: No Food Insecurity (08/01/2022)   Hunger Vital Sign    Worried About Running Out of Food in the Last Year: Never true    Ran Out of Food in the Last Year: Never true  Transportation Needs: No Transportation Needs (08/01/2022)   PRAPARE - Hydrologist (Medical): No    Lack of Transportation (Non-Medical): No  Physical Activity: Unknown (08/01/2022)   Exercise Vital Sign    Days of Exercise per Week: Patient declined    Minutes of Exercise per Session: Not on file  Stress: No Stress Concern Present (08/01/2022)   [place]    Feeling of Stress : Only a little  Social Connections: Socially Integrated (08/01/2022)   Social Connection and Isolation Panel [NHANES]    Frequency of Communication with Friends and Family: More than three times a week    Frequency of Social Gatherings with Friends and Family: Once a week    Attends Religious Services: More than 4 times per year    Active Member of [employer] or Organizations: Yes    Attends Music therapist: More than 4 times per year    Marital Status: Married  Human resources officer Violence: Not on file      Review of Systems  All other systems reviewed and are negative.      Objective:   Physical Exam Constitutional:      General: He is not in acute distress.    Appearance: He is well-developed. He is not diaphoretic.  Eyes:     Conjunctiva/sclera: Conjunctivae normal.     Pupils: Pupils are equal, round, and reactive to light.  Neck:     Thyroid: No thyromegaly.     Vascular: No JVD.  Cardiovascular:     Rate and Rhythm: Normal rate and regular rhythm.     Heart sounds:  Normal heart sounds. No murmur heard.    No friction rub. No gallop.  Pulmonary:     Effort: Pulmonary effort is normal. No respiratory distress.     Breath sounds: No decreased air movement. No wheezing, rhonchi or rales.  Abdominal:     General: Bowel sounds are normal. There is no distension.     Palpations: Abdomen is soft.     Tenderness: There is no abdominal tenderness. There is no rebound.  Genitourinary:    Prostate: Normal.     Rectum: Normal.  Musculoskeletal:     Cervical back: Neck supple.  Lymphadenopathy:     Cervical: No cervical adenopathy.  Neurological:     Mental Status: He is alert and oriented to person, place, and time.     Cranial Nerves: No cranial nerve deficit.     Motor: No abnormal muscle tone.     Coordination: Coordination normal.     Deep Tendon Reflexes: Reflexes are normal and symmetric.           Assessment & Plan:  Controlled type 2 diabetes mellitus with complication, without long-term current use of insulin ([place]) - Plan: CT CARDIAC SCORING (SELF PAY ONLY) Cholesterol is not terribly high.  His LDL cholesterol is 105.  However I would like patient be on a statin given the fact he is diabetic.  His urine protein creatinine ratio is excellent.  Patient is hesitant to take his statin.  Therefore I recommended a coronary artery calcium score.  If the coronary artery calcium score is elevated, the  patient will consent to take a statin.  If the coronary artery calcium score shows low risk, the patient prefers not to take additional medication.

## 2022-08-20 LAB — T4, FREE: Free T4: 0.78 ng/dL — ABNORMAL LOW (ref 0.82–1.77)

## 2022-08-20 LAB — T3, FREE: T3, Free: 2.3 pg/mL (ref 2.0–4.4)

## 2022-08-20 LAB — TSH: TSH: 0.009 u[IU]/mL — ABNORMAL LOW (ref 0.450–4.500)

## 2022-08-23 NOTE — Patient Instructions (Signed)

## 2022-08-24 ENCOUNTER — Encounter: Payer: Self-pay | Admitting: Nurse Practitioner

## 2022-08-24 ENCOUNTER — Other Ambulatory Visit: Payer: Self-pay

## 2022-08-24 ENCOUNTER — Ambulatory Visit: Payer: BC Managed Care – PPO | Admitting: Nurse Practitioner

## 2022-08-24 VITALS — BP 118/70 | HR 64 | Ht 73.0 in | Wt 216.2 lb

## 2022-08-24 DIAGNOSIS — E890 Postprocedural hypothyroidism: Secondary | ICD-10-CM

## 2022-08-24 MED ORDER — LEVOTHYROXINE SODIUM 50 MCG PO TABS
50.0000 ug | ORAL_TABLET | Freq: Every day | ORAL | 1 refills | Status: DC
  Filled 2022-08-24: qty 90, 90d supply, fill #0
  Filled 2022-11-15: qty 90, 90d supply, fill #1

## 2022-08-24 NOTE — Progress Notes (Signed)
08/24/2022     Endocrinology Follow Up Note    Subjective:    Patient ID: Kyle Campbell, male    DOB: 1961-04-27, PCP Donita Brooks, MD.   Past Medical History:  Diagnosis Date   Asthma    Diabetes mellitus without complication    Erectile dysfunction    GERD (gastroesophageal reflux disease)    Hyperlipidemia    Obstructive sleep apnea    compliant with CPAP   Testosterone deficiency     Past Surgical History:  Procedure Laterality Date   TONSILLECTOMY     age 7    Social History   Socioeconomic History   Marital status: Married    Spouse name: Not on file   Number of children: Not on file   Years of education: Not on file   Highest education level: 12th grade  Occupational History   Not on file  Tobacco Use   Smoking status: Never   Smokeless tobacco: Never  Vaping Use   Vaping Use: Never used  Substance and Sexual Activity   Alcohol use: Yes    Alcohol/week: 6.0 standard drinks of alcohol    Types: 1 Standard drinks or equivalent, 5 Shots of liquor per week    Comment: weekend only   Drug use: No   Sexual activity: Yes    Comment: Married.    Other Topics Concern   Not on file  Social History Narrative   Has 3 children. One 48 year, and 42 year old twins. 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    Social Determinants of Health   Financial Resource Strain: Low Risk  (08/01/2022)   Overall Financial Resource Strain (CARDIA)    Difficulty of Paying Living Expenses: Not hard at all  Food Insecurity: No Food Insecurity (08/01/2022)   Hunger Vital Sign    Worried About Running Out of Food in the Last Year: Never true    Ran Out of Food in the Last Year: Never true  Transportation Needs: No Transportation Needs (08/01/2022)   PRAPARE - Administrator, Civil Service (Medical): No    Lack of Transportation (Non-Medical): No  Physical Activity: Unknown (08/01/2022)   Exercise Vital Sign    Days of Exercise per Week: Patient  declined    Minutes of Exercise per Session: Not on file  Stress: No Stress Concern Present (08/01/2022)   [employer] of Occupational Health - Occupational Stress Questionnaire    Feeling of Stress : Only a little  Social Connections: Socially Integrated (08/01/2022)   Social Connection and Isolation Panel [NHANES]    Frequency of Communication with Friends and Family: More than three times a week    Frequency of Social Gatherings with Friends and Family: Once a week    Attends Religious Services: More than 4 times per year    Active Member of [employer] or Organizations: Yes    Attends Engineer, structural: More than 4 times per year    Marital Status: Married    Family History  Problem Relation Age of Onset   Lung cancer Father     Outpatient Encounter Medications as of 08/24/2022  Medication Sig   albuterol (VENTOLIN HFA) 108 (90 Base) MCG/ACT inhaler INHALE 1 PUFF BY MOUTH EVERY FOUR HOURS AS NEEDED   Dulaglutide (TRULICITY) 1.5 MG/0.5ML SOPN Inject 1.5 mg into the skin once a week.   fluticasone-salmeterol (ADVAIR DISKUS) 250-50 MCG/ACT AEPB Inhale 1 puff into the lungs 2 (two)  times daily.   glucose blood (ACCU-CHEK AVIVA PLUS) test strip Use as instructed   Insulin Pen Needle 29G X MISC 1 Dose by Does not apply route every 14 (fourteen) days.   Lancets (ACCU-CHEK MULTICLIX) lancets Use as instructed   levothyroxine (SYNTHROID) 50 MCG tablet Take 1 tablet (50 mcg total) by mouth daily.   metFORMIN (GLUCOPHAGE) 500 MG tablet Take 1 tablet (500 mg total) by mouth 2 (two) times daily with a meal.   sildenafil (VIAGRA) 100 MG tablet Take 1/2-1 tablets (50-100 mg total) by mouth daily as needed for erectile dysfunction.   traMADol (ULTRAM) 50 MG tablet Take 1 tablet (50 mg total) by mouth every 6 (six) hours as needed. for pain   UNABLE TO FIND CPAP mask - DX: OSA G47.33   [DISCONTINUED] methimazole (TAPAZOLE) 5 MG tablet Take 1 tablet (5 mg total) by mouth 2 (two)  times daily.   [DISCONTINUED] propranolol (INDERAL) 20 MG tablet Take 1 tablet (20 mg total) by mouth 2 (two) times daily.   No facility-administered encounter medications on file as of 08/24/2022.    ALLERGIES: No Known Allergies  VACCINATION STATUS: Immunization History  Administered Date(s) Administered   Influenza,inj,Quad PF,6+ Mos 04/08/2015, 01/19/2016, 04/21/2017, 03/01/2019   Influenza-Unspecified 05/20/2003   Td 04/26/2002   Tdap 02/02/2013     HPI  DAMEK ENDE is 62 y.o. male who presents today with a medical history as above. he is being seen in consultation for hyperthyroidism requested by Donita Brooks, MD.  he has been dealing with symptoms of unexplained weight loss, tremors, palpitations, heat intolerance, weakness, fatigue, irritability, dry eyes, dizziness, and diarrhea for about 6 months now. These symptoms are progressively worsening and troubling to him.   he denies dysphagia, choking, shortness of breath, no recent voice change.    he denies family history of thyroid dysfunction and denies family hx of thyroid cancer. He does not know his family history on his fathers side.  he denies personal history of goiter. he is not on any anti-thyroid medications nor on any thyroid hormone supplements. Denies use of Biotin containing supplements.  he is willing to proceed with appropriate work up and therapy for thyrotoxicosis.  He has never had any imaging of his thyroid in the past.   He is s/p RAI ablation on 05/14/22.   Review of systems  Constitutional: + increasing body weight (regaining some previously lost unintentionally), current Body mass index is 28.52 kg/m., no fatigue, + subjective hyperthermia-improved, no subjective hypothermia Eyes: + blurry vision, + xerophthalmia ENT: no sore throat, no nodules palpated in throat, no dysphagia/odynophagia, no hoarseness Cardiovascular: no chest pain, no shortness of breath, no palpitations, no leg  swelling Respiratory: no cough, no shortness of breath Gastrointestinal: no nausea/vomiting, no diarrhea Musculoskeletal: no muscle/joint aches Skin: no rashes, no hyperemia Neurological: no tremors, no numbness, no tingling, no dizziness Psychiatric: no depression, no anxiety,no irritability   Objective:    BP 118/70 (BP Location: Left Arm, Patient Position: Sitting, Cuff Size: Large)   Pulse 64   Ht 6\' 1"  (1.854 m)   Wt 216 lb 3.2 oz (98.1 kg)   BMI 28.52 kg/m   Wt Readings from Last 3 Encounters:  08/24/22 216 lb 3.2 oz (98.1 kg)  08/02/22 217 lb (98.4 kg)  06/28/22 210 lb (95.3 kg)     BP Readings from Last 3 Encounters:  08/24/22 118/70  08/02/22 132/64  06/28/22 (!) 118/58  Physical Exam- Limited  Constitutional:  Body mass index is 28.52 kg/m. , not in acute distress, normal state of mind Eyes:  EOMI, no exophthalmos Musculoskeletal: no gross deformities, strength intact in all four extremities, no gross restriction of joint movements Skin:  no rashes, no hyperemia Neurological: no tremor with outstretched hands   CMP     Component Value Date/Time   NA 141 06/28/2022 1647   K 4.3 06/28/2022 1647   CL 105 06/28/2022 1647   CO2 30 06/28/2022 1647   GLUCOSE 118 (H) 06/28/2022 1647   BUN 12 06/28/2022 1647   CREATININE 0.91 06/28/2022 1647   CALCIUM 9.3 06/28/2022 1647   PROT 6.4 06/28/2022 1647   ALBUMIN 4.5 09/20/2016 1018   AST 14 06/28/2022 1647   ALT 22 06/28/2022 1647   ALKPHOS 72 09/20/2016 1018   BILITOT 0.4 06/28/2022 1647   GFRNONAA 73 09/19/2020 1622   GFRAA 84 09/19/2020 1622     CBC    Component Value Date/Time   WBC 7.0 07/29/2022 0808   RBC 5.36 07/29/2022 0808   HGB 15.8 07/29/2022 0808   HCT 48.0 07/29/2022 0808   PLT 213 07/29/2022 0808   MCV 89.6 07/29/2022 0808   MCH 29.5 07/29/2022 0808   MCHC 32.9 07/29/2022 0808   RDW 13.7 07/29/2022 0808   LYMPHSABS 1,813 07/29/2022 0808   MONOABS 380 09/20/2016 1018    EOSABS 350 07/29/2022 0808   BASOSABS 49 07/29/2022 0808     Diabetic Labs (most recent): Lab Results  Component Value Date   HGBA1C 7.0 (H) 06/28/2022   HGBA1C 9.2 (H) 01/29/2022   HGBA1C 6.1 (H) 10/26/2021   MICROALBUR 0.6 07/29/2022   MICROALBUR <0.2 01/05/2021   MICROALBUR 0.2 04/18/2020    Lipid Panel     Component Value Date/Time   CHOL 166 07/29/2022 0808   TRIG 134 07/29/2022 0808   HDL 38 (L) 07/29/2022 0808   CHOLHDL 4.4 07/29/2022 0808   VLDL 39 (H) 09/20/2016 1018   LDLCALC 105 (H) 07/29/2022 0808     Lab Results  Component Value Date   TSH 0.009 (L) 08/19/2022   TSH <0.005 (L) 06/21/2022   TSH <0.005 (L) 02/15/2022   TSH <0.005 (L) 01/22/2022   TSH <0.005 (L) 12/16/2021   TSH <0.01 (L) 10/26/2021   TSH 1.33 09/19/2020   TSH 1.36 01/13/2016   TSH 1.336 09/06/2014   TSH 1.570 10/17/2008   FREET4 0.78 (L) 08/19/2022   FREET4 2.65 (H) 06/21/2022   FREET4 1.39 02/15/2022   FREET4 1.76 01/22/2022   FREET4 2.73 (H) 12/16/2021   FREET4 1.6 11/03/2021   FREET4 5.3 10/17/2008      Uptake and Scan from 03/11/22 CLINICAL DATA:  Hyperthyroidism, weight loss, heat sensitivity, diarrhea, tremors, moodiness, increased thirst, palpitations, weakness, dry skin, brittle nails, suppressed TSH < 0.005   EXAM: THYROID SCAN AND UPTAKE - 4 AND 24 HOURS   TECHNIQUE: Following oral administration of I-123 capsule, anterior planar imaging was acquired at 24 hours. Thyroid uptake was calculated with a thyroid probe at 4-6 hours and 24 hours.   RADIOPHARMACEUTICALS:  453 uCi I-123 sodium iodide p.o.   COMPARISON:  None Available.   FINDINGS: Cold nodules identified at lateral aspect of inferior thyroid lobes bilaterally larger on LEFT.   No definite hot nodules.   4 hour I-123 uptake = 42.8% (normal 5-20%)   24 hour I-123 uptake = 57.5% (normal 10-30%)   IMPRESSION: Elevated 4 hour and 24 hour radio iodine uptakes  consistent with hyperthyroidism.    Cold nodules at the inferior poles of both thyroid lobes, larger on LEFT; thyroid ultrasound assessment recommended prior to consideration of radioactive iodine therapy.     Electronically Signed   By: Ulyses Southward M.D.   On: 03/11/2022 11:08        Latest Reference Range & Units 12/16/21 15:06 01/22/22 08:15 02/15/22 09:43 06/21/22 08:40 08/19/22 08:06  TSH 0.450 - 4.500 uIU/mL <0.005 (L) <0.005 (L) <0.005 (L) <0.005 (L) 0.009 (L)  Triiodothyronine,Free,Serum 2.0 - 4.4 pg/mL 9.8 (H) 5.1 (H) 3.5 6.8 (H) 2.3  T4,Free(Direct) 0.82 - 1.77 ng/dL 1.61 (H) 0.96 0.45 4.09 (H) 0.78 (L)  (L): Data is abnormally low (H): Data is abnormally high   Assessment & Plan:   1. Hypothyroidism-s/p RAI ablation for Graves disease 2. Thyroid antibody positive 3. Bilateral Cold thyroid nodules  he is being seen at a kind request of Donita Brooks, MD.  his history and most recent labs are reviewed, and he was examined clinically. Subjective and objective findings are consistent with thyrotoxicosis likely from primary hyperthyroidism. The potential risks of untreated thyrotoxicosis and the need for definitive therapy have been discussed in detail with him, and he agrees to proceed with diagnostic workup and treatment plan.   His uptake and scan is consistent with Graves disease with elevated 4 and 24 hr uptakes.  He did have 2 cold nodules in bilateral lower poles which warrant evaluation prior to RAI ablation with thyroid ultrasound.  He agreed to proceed with this additional test with biopsy which was sent to [facility] and determined to be benign, with less than 4% chance of malignancy.  He underwent RAI ablation for hyperthyroidism on 05/14/22.    His previsit labs are suggestive that thyroid is shutting down (TSH still suppressed but is usually the last to correct), indicating successful ablation treatment.  Will start him on Levothyroxine 50 mcg po daily before breakfast.   - The correct intake of  thyroid hormone (Levothyroxine, Synthroid), is on empty stomach first thing in the morning, with water, separated by at least 30 minutes from breakfast and other medications,  and separated by more than 4 hours from calcium, iron, multivitamins, acid reflux medications (PPIs).  - This medication is a life-long medication and will be needed to correct thyroid hormone imbalances for the rest of your life.  The dose may change from time to time, based on thyroid blood work.  - It is extremely important to be consistent taking this medication, near the same time each morning.  -AVOID TAKING PRODUCTS CONTAINING BIOTIN (commonly found in Hair, Skin, Nails vitamins) AS IT INTERFERES WITH THE VALIDITY OF THYROID FUNCTION BLOOD TESTS.     -Patient is advised to maintain close follow up with Donita Brooks, MD for primary care needs.    I spent  18  minutes in the care of the patient today including review of labs from Thyroid Function, CMP, and other relevant labs ; imaging/biopsy records (current and previous including abstractions from other facilities); face-to-face time discussing  his lab results and symptoms, medications doses, his options of short and long term treatment based on the latest standards of care / guidelines;   and documenting the encounter.  Kyle Campbell  participated in the discussions, expressed understanding, and voiced agreement with the above plans.  All questions were answered to his satisfaction. he is encouraged to contact clinic should he have any questions or concerns prior to his return visit.  Follow up plan: Return in about 2 months (around 10/24/2022) for Thyroid follow up, Previsit labs.   Thank you for involving me in the care of this pleasant patient, and I will continue to update you with his progress.   Ronny Bacon, [facility] [address] [place], [place] [address] Phone: [phone] Fax:  [phone]  08/24/2022, 10:16 AM

## 2022-08-30 ENCOUNTER — Other Ambulatory Visit: Payer: Self-pay

## 2022-08-30 ENCOUNTER — Other Ambulatory Visit: Payer: Self-pay | Admitting: Family Medicine

## 2022-08-30 MED ORDER — TRAMADOL HCL 50 MG PO TABS
50.0000 mg | ORAL_TABLET | Freq: Four times a day (QID) | ORAL | 0 refills | Status: DC | PRN
  Filled 2022-08-30: qty 90, 23d supply, fill #0

## 2022-09-07 ENCOUNTER — Ambulatory Visit (HOSPITAL_COMMUNITY)
Admission: RE | Admit: 2022-09-07 | Discharge: 2022-09-07 | Disposition: A | Discharge status: Home / Self Care | Payer: BC Managed Care – PPO | Source: Ambulatory Visit | Attending: Family Medicine | Admitting: Family Medicine

## 2022-09-07 DIAGNOSIS — E118 Type 2 diabetes mellitus with unspecified complications: Secondary | ICD-10-CM

## 2022-09-15 ENCOUNTER — Other Ambulatory Visit: Payer: Self-pay

## 2022-09-15 DIAGNOSIS — E785 Hyperlipidemia, unspecified: Secondary | ICD-10-CM

## 2022-09-15 MED ORDER — ROSUVASTATIN CALCIUM 20 MG PO TABS
20.0000 mg | ORAL_TABLET | Freq: Every day | ORAL | 1 refills | Status: DC
  Filled 2022-09-15: qty 90, 90d supply, fill #0

## 2022-09-27 ENCOUNTER — Other Ambulatory Visit: Payer: Self-pay | Admitting: Family Medicine

## 2022-09-27 ENCOUNTER — Other Ambulatory Visit: Payer: Self-pay

## 2022-09-28 ENCOUNTER — Other Ambulatory Visit: Payer: Self-pay

## 2022-09-28 MED FILL — Fluticasone-Salmeterol Aer Powder BA 250-50 MCG/ACT: RESPIRATORY_TRACT | 30 days supply | Qty: 60 | Fill #0 | Status: AC

## 2022-09-28 NOTE — Telephone Encounter (Signed)
Requested Prescriptions  Pending Prescriptions Disp Refills   ADVAIR DISKUS 250-50 MCG/ACT AEPB [Pharmacy Med Name: fluticasone-salmeterol (ADVAIR DISKUS) 250-50 MCG/ACT Aerosol Powder, Breath Activtivatede] 60 each 3    Sig: Inhale 1 puff into the lungs 2 (two) times daily.     Pulmonology:  Combination Products Failed - 09/27/2022  5:53 PM      Failed - Valid encounter within last 12 months    Recent Outpatient Visits           1 year ago Controlled type 2 diabetes mellitus without complication, without long-term current use of insulin (HCC)   [facility] [name], Priscille Heidelberg, MD   1 year ago Testosterone deficiency   [facility] Donita Brooks, MD   1 year ago Uncontrolled type 2 diabetes mellitus with hyperglycemia (HCC)   [facility] Donita Brooks, MD   1 year ago Uncontrolled type 2 diabetes mellitus with hyperglycemia (HCC)   [facility] [name], Priscille Heidelberg, MD   2 years ago Chronic fatigue   [facility] [name], Priscille Heidelberg, MD

## 2022-09-29 ENCOUNTER — Other Ambulatory Visit: Payer: Self-pay | Admitting: Family Medicine

## 2022-09-30 ENCOUNTER — Other Ambulatory Visit: Payer: Self-pay

## 2022-09-30 ENCOUNTER — Other Ambulatory Visit: Payer: Self-pay | Admitting: Family Medicine

## 2022-09-30 NOTE — Telephone Encounter (Signed)
Requested medication (s) are due for refill today - yes  Requested medication (s) are on the active medication list -yes  Future visit scheduled -no  Last refill: 08/30/22 #90  Notes to clinic: non delegated Rx  Requested Prescriptions  Pending Prescriptions Disp Refills   traMADol (ULTRAM) 50 MG tablet 90 tablet 0    Sig: Take 1 tablet (50 mg total) by mouth every 6 (six) hours as needed. for pain     Not Delegated - Analgesics:  Opioid Agonists Failed - 09/30/2022 12:21 PM      Failed - This refill cannot be delegated      Failed - Urine Drug Screen completed in last 360 days      Failed - Valid encounter within last 3 months    Recent Outpatient Visits           1 year ago Controlled type 2 diabetes mellitus without complication, without long-term current use of insulin (HCC)   [facility] Medicine [name], Priscille Heidelberg, MD   1 year ago Testosterone deficiency   [facility] Medicine Donita Brooks, MD   1 year ago Uncontrolled type 2 diabetes mellitus with hyperglycemia (HCC)   [facility] Medicine Donita Brooks, MD   2 years ago Uncontrolled type 2 diabetes mellitus with hyperglycemia (HCC)   [facility] Medicine Donita Brooks, MD   2 years ago Chronic fatigue   [facility] [name], Priscille Heidelberg, MD                 Requested Prescriptions  Pending Prescriptions Disp Refills   traMADol (ULTRAM) 50 MG tablet 90 tablet 0    Sig: Take 1 tablet (50 mg total) by mouth every 6 (six) hours as needed. for pain     Not Delegated - Analgesics:  Opioid Agonists Failed - 09/30/2022 12:21 PM      Failed - This refill cannot be delegated      Failed - Urine Drug Screen completed in last 360 days      Failed - Valid encounter within last 3 months    Recent Outpatient Visits           1 year ago Controlled type 2 diabetes mellitus without complication, without long-term current use of insulin (HCC)   [facility]  Medicine [name], Priscille Heidelberg, MD   1 year ago Testosterone deficiency   [facility] Medicine Donita Brooks, MD   1 year ago Uncontrolled type 2 diabetes mellitus with hyperglycemia (HCC)   [facility] Medicine Donita Brooks, MD   2 years ago Uncontrolled type 2 diabetes mellitus with hyperglycemia (HCC)   [facility] Medicine [name], Priscille Heidelberg, MD   2 years ago Chronic fatigue   [facility] Medicine [name], Priscille Heidelberg, MD

## 2022-09-30 NOTE — Telephone Encounter (Signed)
Requested medication (s) are due for refill today - yes  Requested medication (s) are on the active medication list -yes  Future visit scheduled -no  Last refill: 08/30/22 #90   Notes to clinic: non delegated Rx  Requested Prescriptions  Pending Prescriptions Disp Refills   traMADol (ULTRAM) 50 MG tablet 90 tablet 0    Sig: Take 1 tablet (50 mg total) by mouth every 6 (six) hours as needed. for pain     Not Delegated - Analgesics:  Opioid Agonists Failed - 09/29/2022  8:44 PM      Failed - This refill cannot be delegated      Failed - Urine Drug Screen completed in last 360 days      Failed - Valid encounter within last 3 months    Recent Outpatient Visits           1 year ago Controlled type 2 diabetes mellitus without complication, without long-term current use of insulin (HCC)   [facility] Medicine [name], Priscille Heidelberg, MD   1 year ago Testosterone deficiency   [facility] Medicine Donita Brooks, MD   1 year ago Uncontrolled type 2 diabetes mellitus with hyperglycemia (HCC)   [facility] Medicine Donita Brooks, MD   2 years ago Uncontrolled type 2 diabetes mellitus with hyperglycemia (HCC)   [facility] Medicine Donita Brooks, MD   2 years ago Chronic fatigue   [facility] Medicine [name], Priscille Heidelberg, MD                 Requested Prescriptions  Pending Prescriptions Disp Refills   traMADol (ULTRAM) 50 MG tablet 90 tablet 0    Sig: Take 1 tablet (50 mg total) by mouth every 6 (six) hours as needed. for pain     Not Delegated - Analgesics:  Opioid Agonists Failed - 09/29/2022  8:44 PM      Failed - This refill cannot be delegated      Failed - Urine Drug Screen completed in last 360 days      Failed - Valid encounter within last 3 months    Recent Outpatient Visits           1 year ago Controlled type 2 diabetes mellitus without complication, without long-term current use of insulin (HCC)   [facility]  Medicine [name], Priscille Heidelberg, MD   1 year ago Testosterone deficiency   [facility] Medicine Donita Brooks, MD   1 year ago Uncontrolled type 2 diabetes mellitus with hyperglycemia (HCC)   [facility] Medicine Donita Brooks, MD   2 years ago Uncontrolled type 2 diabetes mellitus with hyperglycemia (HCC)   [facility] Medicine [name], Priscille Heidelberg, MD   2 years ago Chronic fatigue   [facility] Medicine [name], Priscille Heidelberg, MD

## 2022-10-01 ENCOUNTER — Other Ambulatory Visit: Payer: Self-pay

## 2022-10-01 MED FILL — Tramadol HCl Tab 50 MG: ORAL | 23 days supply | Qty: 90 | Fill #0 | Status: AC

## 2022-10-05 ENCOUNTER — Other Ambulatory Visit: Payer: Self-pay

## 2022-10-25 NOTE — Patient Instructions (Incomplete)

## 2022-10-26 ENCOUNTER — Encounter: Payer: Self-pay | Admitting: Nurse Practitioner

## 2022-10-26 ENCOUNTER — Ambulatory Visit: Payer: BC Managed Care – PPO | Admitting: Nurse Practitioner

## 2022-10-26 VITALS — BP 111/70 | HR 76 | Ht 73.0 in | Wt 216.0 lb

## 2022-10-26 DIAGNOSIS — E890 Postprocedural hypothyroidism: Secondary | ICD-10-CM | POA: Diagnosis not present

## 2022-10-26 LAB — TSH: TSH: <0.005 u[IU]/mL — ABNORMAL LOW (ref 0.450–4.500)

## 2022-10-26 LAB — T4, FREE: Free T4: 1.46 ng/dL (ref 0.82–1.77)

## 2022-10-26 NOTE — Progress Notes (Signed)
10/26/2022     Endocrinology Follow Up Note    Subjective:    Patient ID: Kyle Campbell, male    DOB: [birth year], PCP Kyle Brooks, MD.   Past Medical History:  Diagnosis Date   Asthma    Diabetes mellitus without complication (HCC)    Erectile dysfunction    GERD (gastroesophageal reflux disease)    Hyperlipidemia    Obstructive sleep apnea    compliant with CPAP   Testosterone deficiency     Past Surgical History:  Procedure Laterality Date   TONSILLECTOMY     age 56    Social History   Socioeconomic History   Marital status: Married    Spouse name: Not on file   Number of children: Not on file   Years of education: Not on file   Highest education level: 12th grade  Occupational History   Not on file  Tobacco Use   Smoking status: Never   Smokeless tobacco: Never  Vaping Use   Vaping Use: Never used  Substance and Sexual Activity   Alcohol use: Yes    Alcohol/week: 6.0 standard drinks of alcohol    Types: 1 Standard drinks or equivalent, 5 Shots of liquor per week    Comment: weekend only   Drug use: No   Sexual activity: Yes    Comment: Married.    Other Topics Concern   Not on file  Social History Narrative   Has 3 children. One 35 year, and 60 year old twins. 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    Social Determinants of Health   Financial Resource Strain: Low Risk  (08/01/2022)   Overall Financial Resource Strain (CARDIA)    Difficulty of Paying Living Expenses: Not hard at all  Food Insecurity: No Food Insecurity (08/01/2022)   Hunger Vital Sign    Worried About Running Out of Food in the Last Year: Never true    Ran Out of Food in the Last Year: Never true  Transportation Needs: No Transportation Needs (08/01/2022)   PRAPARE - Administrator, Civil Service (Medical): No    Lack of Transportation (Non-Medical): No  Physical Activity: Unknown (08/01/2022)   Exercise Vital Sign    Days of Exercise per Week:  Patient declined    Minutes of Exercise per Session: Not on file  Stress: No Stress Concern Present (08/01/2022)   [employer] of Occupational Health - Occupational Stress Questionnaire    Feeling of Stress : Only a little  Social Connections: Socially Integrated (08/01/2022)   Social Connection and Isolation Panel [NHANES]    Frequency of Communication with Friends and Family: More than three times a week    Frequency of Social Gatherings with Friends and Family: Once a week    Attends Religious Services: More than 4 times per year    Active Member of [employer] or Organizations: Yes    Attends Engineer, structural: More than 4 times per year    Marital Status: Married    Family History  Problem Relation Age of Onset   Lung cancer Father     Outpatient Encounter Medications as of 10/26/2022  Medication Sig   albuterol (VENTOLIN HFA) 108 (90 Base) MCG/ACT inhaler INHALE 1 PUFF BY MOUTH EVERY FOUR HOURS AS NEEDED   Dulaglutide (TRULICITY) 1.5 MG/0.5ML SOPN Inject 1.5 mg into the skin once a week.   fluticasone-salmeterol (ADVAIR DISKUS) 250-50 MCG/ACT AEPB Inhale 1 puff into the lungs 2 (  two) times daily.   glucose blood (ACCU-CHEK AVIVA PLUS) test strip Use as instructed   Insulin Pen Needle 29G X MISC 1 Dose by Does not apply route every 14 (fourteen) days.   Lancets (ACCU-CHEK MULTICLIX) lancets Use as instructed   levothyroxine (SYNTHROID) 50 MCG tablet Take 1 tablet (50 mcg total) by mouth daily.   metFORMIN (GLUCOPHAGE) 500 MG tablet Take 1 tablet (500 mg total) by mouth 2 (two) times daily with a meal.   sildenafil (VIAGRA) 100 MG tablet Take 1/2-1 tablets (50-100 mg total) by mouth daily as needed for erectile dysfunction.   traMADol (ULTRAM) 50 MG tablet Take 1 tablet (50 mg total) by mouth every 6 (six) hours as needed. for pain   UNABLE TO FIND CPAP mask - DX: OSA G47.33   rosuvastatin (CRESTOR) 20 MG tablet Take 1 tablet (20 mg total) by mouth daily. (Patient  not taking: Reported on 10/26/2022)   No facility-administered encounter medications on file as of 10/26/2022.    ALLERGIES: No Known Allergies  VACCINATION STATUS: Immunization History  Administered Date(s) Administered   Influenza,inj,Quad PF,6+ Mos 04/08/2015, 01/19/2016, 04/21/2017, 03/01/2019   Influenza-Unspecified 05/20/2003   Td 04/26/2002   Tdap 02/02/2013     HPI  Kyle Campbell is 62 y.o. male who presents today with a medical history as above. he is being seen in consultation for hyperthyroidism requested by Kyle Brooks, MD.  he has been dealing with symptoms of unexplained weight loss, tremors, palpitations, heat intolerance, weakness, fatigue, irritability, dry eyes, dizziness, and diarrhea for about 6 months now. These symptoms are progressively worsening and troubling to him.   he denies dysphagia, choking, shortness of breath, no recent voice change.    he denies family history of thyroid dysfunction and denies family hx of thyroid cancer. He does not know his family history on his fathers side.  he denies personal history of goiter. he is not on any anti-thyroid medications nor on any thyroid hormone supplements. Denies use of Biotin containing supplements.  he is willing to proceed with appropriate work up and therapy for thyrotoxicosis.  He has never had any imaging of his thyroid in the past.   He is s/p RAI ablation on 05/14/22.   Review of systems  Constitutional: + istable body weight (regaining some previously lost unintentionally), current Body mass index is 28.5 kg/m., no fatigue, + subjective hyperthermia-improved, no subjective hypothermia Eyes: + blurry vision, + xerophthalmia ENT: no sore throat, no nodules palpated in throat, no dysphagia/odynophagia, no hoarseness Cardiovascular: no chest pain, no shortness of breath, no palpitations, no leg swelling Respiratory: no cough, no shortness of breath Gastrointestinal: no nausea/vomiting, no  diarrhea Musculoskeletal: no muscle/joint aches Skin: no rashes, no hyperemia Neurological: no tremors, no numbness, no tingling, no dizziness Psychiatric: no depression, + anxiety, + irritability   Objective:    BP 111/70 (BP Location: Left Arm, Patient Position: Sitting, Cuff Size: Normal)   Pulse 76   Ht 6\' 1"  (1.854 m)   Wt 216 lb (98 kg)   BMI 28.50 kg/m   Wt Readings from Last 3 Encounters:  10/26/22 216 lb (98 kg)  08/24/22 216 lb 3.2 oz (98.1 kg)  08/02/22 217 lb (98.4 kg)     BP Readings from Last 3 Encounters:  10/26/22 111/70  08/24/22 118/70  08/02/22 132/64                   Physical Exam- Limited  Constitutional:  Body mass index  is 28.5 kg/m. , not in acute distress, normal state of mind Eyes:  EOMI, no exophthalmos Musculoskeletal: no gross deformities, strength intact in all four extremities, no gross restriction of joint movements Skin:  no rashes, no hyperemia Neurological: no tremor with outstretched hands   CMP     Component Value Date/Time   NA 141 06/28/2022 1647   K 4.3 06/28/2022 1647   CL 105 06/28/2022 1647   CO2 30 06/28/2022 1647   GLUCOSE 118 (H) 06/28/2022 1647   BUN 12 06/28/2022 1647   CREATININE 0.91 06/28/2022 1647   CALCIUM 9.3 06/28/2022 1647   PROT 6.4 06/28/2022 1647   ALBUMIN 4.5 09/20/2016 1018   AST 14 06/28/2022 1647   ALT 22 06/28/2022 1647   ALKPHOS 72 09/20/2016 1018   BILITOT 0.4 06/28/2022 1647   GFRNONAA 73 09/19/2020 1622   GFRAA 84 09/19/2020 1622     CBC    Component Value Date/Time   WBC 7.0 07/29/2022 0808   RBC 5.36 07/29/2022 0808   HGB 15.8 07/29/2022 0808   HCT 48.0 07/29/2022 0808   PLT 213 07/29/2022 0808   MCV 89.6 07/29/2022 0808   MCH 29.5 07/29/2022 0808   MCHC 32.9 07/29/2022 0808   RDW 13.7 07/29/2022 0808   LYMPHSABS 1,813 07/29/2022 0808   MONOABS 380 09/20/2016 1018   EOSABS 350 07/29/2022 0808   BASOSABS 49 07/29/2022 0808     Diabetic Labs (most recent): Lab Results   Component Value Date   HGBA1C 7.0 (H) 06/28/2022   HGBA1C 9.2 (H) 01/29/2022   HGBA1C 6.1 (H) 10/26/2021   MICROALBUR 0.6 07/29/2022   MICROALBUR <0.2 01/05/2021   MICROALBUR 0.2 04/18/2020    Lipid Panel     Component Value Date/Time   CHOL 166 07/29/2022 0808   TRIG 134 07/29/2022 0808   HDL 38 (L) 07/29/2022 0808   CHOLHDL 4.4 07/29/2022 0808   VLDL 39 (H) 09/20/2016 1018   LDLCALC 105 (H) 07/29/2022 0808     Lab Results  Component Value Date   TSH <0.005 (L) 10/25/2022   TSH 0.009 (L) 08/19/2022   TSH <0.005 (L) 06/21/2022   TSH <0.005 (L) 02/15/2022   TSH <0.005 (L) 01/22/2022   TSH <0.005 (L) 12/16/2021   TSH <0.01 (L) 10/26/2021   TSH 1.33 09/19/2020   TSH 1.36 01/13/2016   TSH 1.336 09/06/2014   FREET4 1.46 10/25/2022   FREET4 0.78 (L) 08/19/2022   FREET4 2.65 (H) 06/21/2022   FREET4 1.39 02/15/2022   FREET4 1.76 01/22/2022   FREET4 2.73 (H) 12/16/2021   FREET4 1.6 11/03/2021   FREET4 5.3 10/17/2008      Uptake and Scan from 03/11/22 CLINICAL DATA:  Hyperthyroidism, weight loss, heat sensitivity, diarrhea, tremors, moodiness, increased thirst, palpitations, weakness, dry skin, brittle nails, suppressed TSH < 0.005   EXAM: THYROID SCAN AND UPTAKE - 4 AND 24 HOURS   TECHNIQUE: Following oral administration of I-123 capsule, anterior planar imaging was acquired at 24 hours. Thyroid uptake was calculated with a thyroid probe at 4-6 hours and 24 hours.   RADIOPHARMACEUTICALS:  453 uCi I-123 sodium iodide p.o.   COMPARISON:  None Available.   FINDINGS: Cold nodules identified at lateral aspect of inferior thyroid lobes bilaterally larger on LEFT.   No definite hot nodules.   4 hour I-123 uptake = 42.8% (normal 5-20%)   24 hour I-123 uptake = 57.5% (normal 10-30%)   IMPRESSION: Elevated 4 hour and 24 hour radio iodine uptakes consistent with hyperthyroidism.  Cold nodules at the inferior poles of both thyroid lobes, larger on LEFT;  thyroid ultrasound assessment recommended prior to consideration of radioactive iodine therapy.     Electronically Signed   By: Ulyses Southward M.D.   On: 03/11/2022 11:08        Latest Reference Range & Units 01/22/22 08:15 02/15/22 09:43 06/21/22 08:40 08/19/22 08:06 10/25/22 08:10  TSH 0.450 - 4.500 uIU/mL <0.005 (L) <0.005 (L) <0.005 (L) 0.009 (L) <0.005 (L)  Triiodothyronine,Free,Serum 2.0 - 4.4 pg/mL 5.1 (H) 3.5 6.8 (H) 2.3   T4,Free(Direct) 0.82 - 1.77 ng/dL 7.82 9.56 2.13 (H) 0.86 (L) 1.46  (L): Data is abnormally low (H): Data is abnormally high   Assessment & Plan:   1. Hypothyroidism-s/p RAI ablation for Graves disease 2. Thyroid antibody positive 3. Bilateral Cold thyroid nodules  he is being seen at a kind request of Kyle Brooks, MD.  his history and most recent labs are reviewed, and he was examined clinically. Subjective and objective findings are consistent with thyrotoxicosis likely from primary hyperthyroidism. The potential risks of untreated thyrotoxicosis and the need for definitive therapy have been discussed in detail with him, and he agrees to proceed with diagnostic workup and treatment plan.   His uptake and scan is consistent with Graves disease with elevated 4 and 24 hr uptakes.  He did have 2 cold nodules in bilateral lower poles which warrant evaluation prior to RAI ablation with thyroid ultrasound.  He agreed to proceed with this additional test with biopsy which was sent to [facility] and determined to be benign, with less than 4% chance of malignancy.  He underwent RAI ablation for hyperthyroidism on 05/14/22.    His previsit thyroid function tests show suppressed TSH and upper normal FT4.  He does have some increased anxiety recently.  TSH is generally the last to correct and is not the most reliable source to assess if dosage change is needed in thyroid hormone.  I recommended he continue his Levothyroxine 50 mcg po daily before breakfast for now.    - The correct intake of thyroid hormone (Levothyroxine, Synthroid), is on empty stomach first thing in the morning, with water, separated by at least 30 minutes from breakfast and other medications,  and separated by more than 4 hours from calcium, iron, multivitamins, acid reflux medications (PPIs).  - This medication is a life-long medication and will be needed to correct thyroid hormone imbalances for the rest of your life.  The dose may change from time to time, based on thyroid blood work.  - It is extremely important to be consistent taking this medication, near the same time each morning.  -AVOID TAKING PRODUCTS CONTAINING BIOTIN (commonly found in Hair, Skin, Nails vitamins) AS IT INTERFERES WITH THE VALIDITY OF THYROID FUNCTION BLOOD TESTS.     -Patient is advised to maintain close follow up with Kyle Brooks, MD for primary care needs.    I spent  21  minutes in the care of the patient today including review of labs from Thyroid Function, CMP, and other relevant labs ; imaging/biopsy records (current and previous including abstractions from other facilities); face-to-face time discussing  his lab results and symptoms, medications doses, his options of short and long term treatment based on the latest standards of care / guidelines;   and documenting the encounter.  Kyle Campbell  participated in the discussions, expressed understanding, and voiced agreement with the above plans.  All questions were answered to his satisfaction. he  is encouraged to contact clinic should he have any questions or concerns prior to his return visit.  Follow up plan: No follow-ups on file.   Thank you for involving me in the care of this pleasant patient, and I will continue to update you with his progress.   Ronny Bacon, [facility] [address] [place], [place] [address] Phone: [phone] Fax: [phone]  10/26/2022, 11:26 AM

## 2022-11-04 ENCOUNTER — Other Ambulatory Visit: Payer: Self-pay

## 2022-11-04 ENCOUNTER — Other Ambulatory Visit: Payer: Self-pay | Admitting: Family Medicine

## 2022-11-04 MED FILL — Fluticasone-Salmeterol Aer Powder BA 250-50 MCG/ACT: RESPIRATORY_TRACT | 30 days supply | Qty: 60 | Fill #1 | Status: AC

## 2022-11-05 ENCOUNTER — Other Ambulatory Visit: Payer: Self-pay | Admitting: Family Medicine

## 2022-11-05 ENCOUNTER — Other Ambulatory Visit: Payer: Self-pay

## 2022-11-07 ENCOUNTER — Other Ambulatory Visit: Payer: Self-pay

## 2022-11-08 ENCOUNTER — Other Ambulatory Visit: Payer: Self-pay

## 2022-11-08 NOTE — Telephone Encounter (Signed)
Requested medication (s) are due for refill today: yes  Requested medication (s) are on the active medication list: yes  Last refill:  10/01/22 #90  Future visit scheduled: no  Notes to clinic:  med not delegated to NT to RF   Requested Prescriptions  Pending Prescriptions Disp Refills   traMADol (ULTRAM) 50 MG tablet 90 tablet 0    Sig: Take 1 tablet (50 mg total) by mouth every 6 (six) hours as needed. for pain     Not Delegated - Analgesics:  Opioid Agonists Failed - 11/05/2022  8:36 AM      Failed - This refill cannot be delegated      Failed - Urine Drug Screen completed in last 360 days      Failed - Valid encounter within last 3 months    Recent Outpatient Visits           1 year ago Controlled type 2 diabetes mellitus without complication, without long-term current use of insulin (HCC)   [facility] Medicine [name], Priscille Heidelberg, MD   1 year ago Testosterone deficiency   [facility] Medicine Donita Brooks, MD   2 years ago Uncontrolled type 2 diabetes mellitus with hyperglycemia (HCC)   [facility] Donita Brooks, MD   2 years ago Uncontrolled type 2 diabetes mellitus with hyperglycemia (HCC)   [facility] [name], Priscille Heidelberg, MD   2 years ago Chronic fatigue   [facility] [name], Priscille Heidelberg, MD

## 2022-11-15 ENCOUNTER — Other Ambulatory Visit: Payer: Self-pay | Admitting: Family Medicine

## 2022-11-15 ENCOUNTER — Other Ambulatory Visit: Payer: Self-pay

## 2022-11-16 ENCOUNTER — Other Ambulatory Visit: Payer: Self-pay

## 2022-11-16 MED FILL — Tramadol HCl Tab 50 MG: ORAL | 23 days supply | Qty: 90 | Fill #0 | Status: AC

## 2022-11-16 NOTE — Telephone Encounter (Signed)
Requested medication (s) are due for refill today: yes   Requested medication (s) are on the active medication list: yes   Last refill:  10/01/22 #90 0 refills  Future visit scheduled: no  Notes to clinic:  not delegated per protocol . Last OV 08/02/22. Do you want to refill Rx?     Requested Prescriptions  Pending Prescriptions Disp Refills   traMADol (ULTRAM) 50 MG tablet 90 tablet 0    Sig: Take 1 tablet (50 mg total) by mouth every 6 (six) hours as needed. for pain     Not Delegated - Analgesics:  Opioid Agonists Failed - 11/15/2022 12:49 PM      Failed - This refill cannot be delegated      Failed - Urine Drug Screen completed in last 360 days      Failed - Valid encounter within last 3 months    Recent Outpatient Visits           1 year ago Controlled type 2 diabetes mellitus without complication, without long-term current use of insulin (HCC)   [facility] Medicine [name], Priscille Heidelberg, MD   1 year ago Testosterone deficiency   [facility] Medicine Donita Brooks, MD   2 years ago Uncontrolled type 2 diabetes mellitus with hyperglycemia (HCC)   [facility] Medicine Donita Brooks, MD   2 years ago Uncontrolled type 2 diabetes mellitus with hyperglycemia (HCC)   [facility] [name], Priscille Heidelberg, MD   2 years ago Chronic fatigue   [facility] [name], Priscille Heidelberg, MD

## 2022-11-18 ENCOUNTER — Other Ambulatory Visit: Payer: Self-pay

## 2022-12-23 ENCOUNTER — Other Ambulatory Visit: Payer: Self-pay | Admitting: Family Medicine

## 2022-12-23 ENCOUNTER — Other Ambulatory Visit: Payer: Self-pay

## 2022-12-23 MED FILL — Fluticasone-Salmeterol Aer Powder BA 250-50 MCG/ACT: RESPIRATORY_TRACT | 30 days supply | Qty: 60 | Fill #2 | Status: AC

## 2022-12-24 ENCOUNTER — Other Ambulatory Visit: Payer: Self-pay

## 2022-12-24 MED FILL — Albuterol Sulfate Inhal Aero 108 MCG/ACT (90MCG Base Equiv): RESPIRATORY_TRACT | 25 days supply | Qty: 6.7 | Fill #0 | Status: AC

## 2022-12-24 NOTE — Telephone Encounter (Signed)
Requested Prescriptions  Pending Prescriptions Disp Refills   albuterol (VENTOLIN HFA) 108 (90 Base) MCG/ACT inhaler 18 g 0    Sig: INHALE 1 PUFF BY MOUTH EVERY FOUR HOURS AS NEEDED     Pulmonology:  Beta Agonists 2 Failed - 12/23/2022  8:05 AM      Failed - Valid encounter within last 12 months    Recent Outpatient Visits           1 year ago Controlled type 2 diabetes mellitus without complication, without long-term current use of insulin (HCC)   [facility] [name], Priscille Heidelberg, MD   1 year ago Testosterone deficiency   [facility] Donita Brooks, MD   2 years ago Uncontrolled type 2 diabetes mellitus with hyperglycemia (HCC)   [facility] Donita Brooks, MD   2 years ago Uncontrolled type 2 diabetes mellitus with hyperglycemia (HCC)   [facility] [name], Priscille Heidelberg, MD   2 years ago Chronic fatigue   [facility] [name], Priscille Heidelberg, MD              Passed - Last BP in normal range    BP Readings from Last 1 Encounters:  10/26/22 111/70         Passed - Last Heart Rate in normal range    Pulse Readings from Last 1 Encounters:  10/26/22 76          traMADol (ULTRAM) 50 MG tablet 90 tablet 0    Sig: Take 1 tablet (50 mg total) by mouth every 6 (six) hours as needed. for pain     Not Delegated - Analgesics:  Opioid Agonists Failed - 12/23/2022  8:05 AM      Failed - This refill cannot be delegated      Failed - Urine Drug Screen completed in last 360 days      Failed - Valid encounter within last 3 months    Recent Outpatient Visits           1 year ago Controlled type 2 diabetes mellitus without complication, without long-term current use of insulin (HCC)   [facility] [name], Priscille Heidelberg, MD   1 year ago Testosterone deficiency   [facility] Donita Brooks, MD   2 years ago Uncontrolled type 2 diabetes mellitus with hyperglycemia (HCC)   [facility] Donita Brooks, MD   2 years ago Uncontrolled type 2 diabetes mellitus with hyperglycemia (HCC)   [facility] [name], Priscille Heidelberg, MD   2 years ago Chronic fatigue   [facility] [name], Priscille Heidelberg, MD

## 2022-12-24 NOTE — Telephone Encounter (Signed)
Requested medication (s) are due for refill today: yes  Requested medication (s) are on the active medication list: yes  Last refill:  11/16/22  Future visit scheduled: no  Notes to clinic:  Unable to refill per protocol, cannot delegate.      Requested Prescriptions  Pending Prescriptions Disp Refills   traMADol (ULTRAM) 50 MG tablet 90 tablet 0    Sig: Take 1 tablet (50 mg total) by mouth every 6 (six) hours as needed. for pain     Not Delegated - Analgesics:  Opioid Agonists Failed - 12/23/2022  8:05 AM      Failed - This refill cannot be delegated      Failed - Urine Drug Screen completed in last 360 days      Failed - Valid encounter within last 3 months    Recent Outpatient Visits           1 year ago Controlled type 2 diabetes mellitus without complication, without long-term current use of insulin (HCC)   [facility] [name], Priscille Heidelberg, MD   1 year ago Testosterone deficiency   [facility] Donita Brooks, MD   2 years ago Uncontrolled type 2 diabetes mellitus with hyperglycemia (HCC)   [facility] Donita Brooks, MD   2 years ago Uncontrolled type 2 diabetes mellitus with hyperglycemia (HCC)   [facility] [name], Priscille Heidelberg, MD   2 years ago Chronic fatigue   [facility] [name], Priscille Heidelberg, MD              Signed Prescriptions Disp Refills   albuterol (VENTOLIN HFA) 108 (90 Base) MCG/ACT inhaler 18 g 0    Sig: INHALE 1 PUFF BY MOUTH EVERY FOUR HOURS AS NEEDED     Pulmonology:  Beta Agonists 2 Failed - 12/23/2022  8:05 AM      Failed - Valid encounter within last 12 months    Recent Outpatient Visits           1 year ago Controlled type 2 diabetes mellitus without complication, without long-term current use of insulin (HCC)   [facility] [name], Priscille Heidelberg, MD   1 year ago Testosterone deficiency   [facility] Donita Brooks, MD   2  years ago Uncontrolled type 2 diabetes mellitus with hyperglycemia (HCC)   [facility] Medicine Donita Brooks, MD   2 years ago Uncontrolled type 2 diabetes mellitus with hyperglycemia (HCC)   [facility] [name], Priscille Heidelberg, MD   2 years ago Chronic fatigue   [facility] [name], Priscille Heidelberg, MD              Passed - Last BP in normal range    BP Readings from Last 1 Encounters:  10/26/22 111/70         Passed - Last Heart Rate in normal range    Pulse Readings from Last 1 Encounters:  10/26/22 76

## 2022-12-27 ENCOUNTER — Other Ambulatory Visit: Payer: Self-pay

## 2022-12-27 MED FILL — Tramadol HCl Tab 50 MG: ORAL | 23 days supply | Qty: 90 | Fill #0 | Status: AC

## 2022-12-28 ENCOUNTER — Encounter: Payer: Self-pay | Admitting: Nurse Practitioner

## 2022-12-28 ENCOUNTER — Ambulatory Visit: Payer: BC Managed Care – PPO | Admitting: Nurse Practitioner

## 2022-12-28 VITALS — BP 116/69 | HR 69 | Ht 73.0 in | Wt 220.8 lb

## 2022-12-28 DIAGNOSIS — E890 Postprocedural hypothyroidism: Secondary | ICD-10-CM

## 2022-12-28 NOTE — Patient Instructions (Signed)

## 2022-12-28 NOTE — Progress Notes (Signed)
12/28/2022     Endocrinology Follow Up Note    Subjective:    Patient ID: Kyle Campbell, male    DOB: 24-Sep-1960, PCP Donita Brooks, MD.   Past Medical History:  Diagnosis Date   Asthma    Diabetes mellitus without complication (HCC)    Erectile dysfunction    GERD (gastroesophageal reflux disease)    Hyperlipidemia    Obstructive sleep apnea    compliant with CPAP   Testosterone deficiency     Past Surgical History:  Procedure Laterality Date   TONSILLECTOMY     age 59    Social History   Socioeconomic History   Marital status: Married    Spouse name: Not on file   Number of children: Not on file   Years of education: Not on file   Highest education level: 12th grade  Occupational History   Not on file  Tobacco Use   Smoking status: Never   Smokeless tobacco: Never  Vaping Use   Vaping status: Never Used  Substance and Sexual Activity   Alcohol use: Yes    Alcohol/week: 6.0 standard drinks of alcohol    Types: 1 Standard drinks or equivalent, 5 Shots of liquor per week    Comment: weekend only   Drug use: No   Sexual activity: Yes    Comment: Married.    Other Topics Concern   Not on file  Social History Narrative   Has 3 children. One 38 year, and 9 year old twins. 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    Social Determinants of Health   Financial Resource Strain: Low Risk  (08/01/2022)   Overall Financial Resource Strain (CARDIA)    Difficulty of Paying Living Expenses: Not hard at all  Food Insecurity: No Food Insecurity (08/01/2022)   Hunger Vital Sign    Worried About Running Out of Food in the Last Year: Never true    Ran Out of Food in the Last Year: Never true  Transportation Needs: No Transportation Needs (08/01/2022)   PRAPARE - Administrator, Civil Service (Medical): No    Lack of Transportation (Non-Medical): No  Physical Activity: Unknown (08/01/2022)   Exercise Vital Sign    Days of Exercise per Week:  Patient declined    Minutes of Exercise per Session: Not on file  Stress: No Stress Concern Present (08/01/2022)   [employer] of Occupational Health - Occupational Stress Questionnaire    Feeling of Stress : Only a little  Social Connections: Socially Integrated (08/01/2022)   Social Connection and Isolation Panel [NHANES]    Frequency of Communication with Friends and Family: More than three times a week    Frequency of Social Gatherings with Friends and Family: Once a week    Attends Religious Services: More than 4 times per year    Active Member of [employer] or Organizations: Yes    Attends Engineer, structural: More than 4 times per year    Marital Status: Married    Family History  Problem Relation Age of Onset   Lung cancer Father     Outpatient Encounter Medications as of 12/28/2022  Medication Sig   albuterol (VENTOLIN HFA) 108 (90 Base) MCG/ACT inhaler INHALE 1 PUFF BY MOUTH EVERY FOUR HOURS AS NEEDED   Dulaglutide (TRULICITY) 1.5 MG/0.5ML SOPN Inject 1.5 mg into the skin once a week.   fluticasone-salmeterol (ADVAIR DISKUS) 250-50 MCG/ACT AEPB Inhale 1 puff into the lungs 2 (  two) times daily.   glucose blood (ACCU-CHEK AVIVA PLUS) test strip Use as instructed   Lancets (ACCU-CHEK MULTICLIX) lancets Use as instructed   levothyroxine (SYNTHROID) 50 MCG tablet Take 1 tablet (50 mcg total) by mouth daily.   metFORMIN (GLUCOPHAGE) 500 MG tablet Take 1 tablet (500 mg total) by mouth 2 (two) times daily with a meal.   sildenafil (VIAGRA) 100 MG tablet Take 1/2-1 tablets (50-100 mg total) by mouth daily as needed for erectile dysfunction.   traMADol (ULTRAM) 50 MG tablet Take 1 tablet (50 mg total) by mouth every 6 (six) hours as needed. for pain   UNABLE TO FIND CPAP mask - DX: OSA G47.33   Insulin Pen Needle 29G X MISC 1 Dose by Does not apply route every 14 (fourteen) days.   rosuvastatin (CRESTOR) 20 MG tablet Take 1 tablet (20 mg total) by mouth daily. (Patient  not taking: Reported on 10/26/2022)   [DISCONTINUED] albuterol (VENTOLIN HFA) 108 (90 Base) MCG/ACT inhaler INHALE 1 PUFF BY MOUTH EVERY FOUR HOURS AS NEEDED   [DISCONTINUED] traMADol (ULTRAM) 50 MG tablet Take 1 tablet (50 mg total) by mouth every 6 (six) hours as needed. for pain   No facility-administered encounter medications on file as of 12/28/2022.    ALLERGIES: No Known Allergies  VACCINATION STATUS: Immunization History  Administered Date(s) Administered   Influenza,inj,Quad PF,6+ Mos 04/08/2015, 01/19/2016, 04/21/2017, 03/01/2019   Influenza-Unspecified 05/20/2003   Td 04/26/2002   Tdap 02/02/2013     HPI  Kyle Campbell is 62 y.o. male who presents today with a medical history as above. he is being seen in consultation for hyperthyroidism requested by Donita Brooks, MD.  he has been dealing with symptoms of unexplained weight loss, tremors, palpitations, heat intolerance, weakness, fatigue, irritability, dry eyes, dizziness, and diarrhea for about 6 months now. These symptoms are progressively worsening and troubling to him.   he denies dysphagia, choking, shortness of breath, no recent voice change.    he denies family history of thyroid dysfunction and denies family hx of thyroid cancer. He does not know his family history on his fathers side.  he denies personal history of goiter. he is not on any anti-thyroid medications nor on any thyroid hormone supplements. Denies use of Biotin containing supplements.  he is willing to proceed with appropriate work up and therapy for thyrotoxicosis.  He has never had any imaging of his thyroid in the past.   He is s/p RAI ablation on 05/14/22.   Review of systems  Constitutional: + stable body weight,  current Body mass index is 29.13 kg/m. , no fatigue, no subjective hyperthermia, no subjective hypothermia Eyes: no blurry vision, no xerophthalmia ENT: no sore throat, no nodules palpated in throat, no dysphagia/odynophagia, no  hoarseness Cardiovascular: no chest pain, no shortness of breath, no palpitations, no leg swelling Respiratory: no cough, no shortness of breath Gastrointestinal: no nausea/vomiting/diarrhea Musculoskeletal: no muscle/joint aches Skin: no rashes, no hyperemia Neurological: no tremors, no numbness, no tingling, no dizziness Psychiatric: no depression, no anxiety   Objective:    BP 116/69 (BP Location: Left Arm, Patient Position: Sitting, Cuff Size: Large)   Pulse 69   Ht 6\' 1"  (1.854 m)   Wt 220 lb 12.8 oz (100.2 kg)   BMI 29.13 kg/m   Wt Readings from Last 3 Encounters:  12/28/22 220 lb 12.8 oz (100.2 kg)  10/26/22 216 lb (98 kg)  08/24/22 216 lb 3.2 oz (98.1 kg)  BP Readings from Last 3 Encounters:  12/28/22 116/69  10/26/22 111/70  08/24/22 118/70                  Physical Exam- Limited  Constitutional:  Body mass index is 29.13 kg/m. , not in acute distress, normal state of mind Eyes:  EOMI, no exophthalmos Musculoskeletal: no gross deformities, strength intact in all four extremities, no gross restriction of joint movements Skin:  no rashes, no hyperemia Neurological: no tremor with outstretched hands   CMP     Component Value Date/Time   NA 141 06/28/2022 1647   K 4.3 06/28/2022 1647   CL 105 06/28/2022 1647   CO2 30 06/28/2022 1647   GLUCOSE 118 (H) 06/28/2022 1647   BUN 12 06/28/2022 1647   CREATININE 0.91 06/28/2022 1647   CALCIUM 9.3 06/28/2022 1647   PROT 6.4 06/28/2022 1647   ALBUMIN 4.5 09/20/2016 1018   AST 14 06/28/2022 1647   ALT 22 06/28/2022 1647   ALKPHOS 72 09/20/2016 1018   BILITOT 0.4 06/28/2022 1647   GFRNONAA 73 09/19/2020 1622   GFRAA 84 09/19/2020 1622     CBC    Component Value Date/Time   WBC 7.0 07/29/2022 0808   RBC 5.36 07/29/2022 0808   HGB 15.8 07/29/2022 0808   HCT 48.0 07/29/2022 0808   PLT 213 07/29/2022 0808   MCV 89.6 07/29/2022 0808   MCH 29.5 07/29/2022 0808   MCHC 32.9 07/29/2022 0808   RDW 13.7  07/29/2022 0808   LYMPHSABS 1,813 07/29/2022 0808   MONOABS 380 09/20/2016 1018   EOSABS 350 07/29/2022 0808   BASOSABS 49 07/29/2022 0808     Diabetic Labs (most recent): Lab Results  Component Value Date   HGBA1C 7.0 (H) 06/28/2022   HGBA1C 9.2 (H) 01/29/2022   HGBA1C 6.1 (H) 10/26/2021   MICROALBUR 0.6 07/29/2022   MICROALBUR <0.2 01/05/2021   MICROALBUR 0.2 04/18/2020    Lipid Panel     Component Value Date/Time   CHOL 166 07/29/2022 0808   TRIG 134 07/29/2022 0808   HDL 38 (L) 07/29/2022 0808   CHOLHDL 4.4 07/29/2022 0808   VLDL 39 (H) 09/20/2016 1018   LDLCALC 105 (H) 07/29/2022 0808     Lab Results  Component Value Date   TSH 0.015 (L) 12/24/2022   TSH <0.005 (L) 10/25/2022   TSH 0.009 (L) 08/19/2022   TSH <0.005 (L) 06/21/2022   TSH <0.005 (L) 02/15/2022   TSH <0.005 (L) 01/22/2022   TSH <0.005 (L) 12/16/2021   TSH <0.01 (L) 10/26/2021   TSH 1.33 09/19/2020   TSH 1.36 01/13/2016   FREET4 1.32 12/24/2022   FREET4 1.46 10/25/2022   FREET4 0.78 (L) 08/19/2022   FREET4 2.65 (H) 06/21/2022   FREET4 1.39 02/15/2022   FREET4 1.76 01/22/2022   FREET4 2.73 (H) 12/16/2021   FREET4 1.6 11/03/2021   FREET4 5.3 10/17/2008      Uptake and Scan from 03/11/22 CLINICAL DATA:  Hyperthyroidism, weight loss, heat sensitivity, diarrhea, tremors, moodiness, increased thirst, palpitations, weakness, dry skin, brittle nails, suppressed TSH < 0.005   EXAM: THYROID SCAN AND UPTAKE - 4 AND 24 HOURS   TECHNIQUE: Following oral administration of I-123 capsule, anterior planar imaging was acquired at 24 hours. Thyroid uptake was calculated with a thyroid probe at 4-6 hours and 24 hours.   RADIOPHARMACEUTICALS:  453 uCi I-123 sodium iodide p.o.   COMPARISON:  None Available.   FINDINGS: Cold nodules identified at lateral aspect of inferior  thyroid lobes bilaterally larger on LEFT.   No definite hot nodules.   4 hour I-123 uptake = 42.8% (normal 5-20%)   24 hour  I-123 uptake = 57.5% (normal 10-30%)   IMPRESSION: Elevated 4 hour and 24 hour radio iodine uptakes consistent with hyperthyroidism.   Cold nodules at the inferior poles of both thyroid lobes, larger on LEFT; thyroid ultrasound assessment recommended prior to consideration of radioactive iodine therapy.     Electronically Signed   By: Ulyses Southward M.D.   On: 03/11/2022 11:08        Latest Reference Range & Units 01/22/22 08:15 02/15/22 09:43 06/21/22 08:40 08/19/22 08:06 10/25/22 08:10 12/24/22 08:19  TSH 0.450 - 4.500 uIU/mL <0.005 (L) <0.005 (L) <0.005 (L) 0.009 (L) <0.005 (L) 0.015 (L)  Triiodothyronine,Free,Serum 2.0 - 4.4 pg/mL 5.1 (H) 3.5 6.8 (H) 2.3    T4,Free(Direct) 0.82 - 1.77 ng/dL 1.02 7.25 3.66 (H) 4.40 (L) 1.46 1.32  (L): Data is abnormally low (H): Data is abnormally high   Assessment & Plan:   1. Hypothyroidism-s/p RAI ablation for Graves disease 2. Thyroid antibody positive 3. Bilateral Cold thyroid nodules  he is being seen at a kind request of Donita Brooks, MD.  his history and most recent labs are reviewed, and he was examined clinically. Subjective and objective findings are consistent with thyrotoxicosis likely from primary hyperthyroidism. The potential risks of untreated thyrotoxicosis and the need for definitive therapy have been discussed in detail with him, and he agrees to proceed with diagnostic workup and treatment plan.   His uptake and scan is consistent with Graves disease with elevated 4 and 24 hr uptakes.  He did have 2 cold nodules in bilateral lower poles which warrant evaluation prior to RAI ablation with thyroid ultrasound.  He agreed to proceed with this additional test with biopsy which was sent to [facility] and determined to be benign, with less than 4% chance of malignancy.  He underwent RAI ablation for hyperthyroidism on 05/14/22.    His previsit thyroid function tests show suppressed TSH and upper normal FT4.  TSH is generally the  last to correct and is not the most reliable source to assess if dosage change is needed in thyroid hormone.  I recommended he continue his Levothyroxine 50 mcg po daily before breakfast for now.  He is feeling better overall on this dose for now.  Will recheck TFTs prior to next visit and adjust dose if necessary.   - The correct intake of thyroid hormone (Levothyroxine, Synthroid), is on empty stomach first thing in the morning, with water, separated by at least 30 minutes from breakfast and other medications,  and separated by more than 4 hours from calcium, iron, multivitamins, acid reflux medications (PPIs).  - This medication is a life-long medication and will be needed to correct thyroid hormone imbalances for the rest of your life.  The dose may change from time to time, based on thyroid blood work.  - It is extremely important to be consistent taking this medication, near the same time each morning.  -AVOID TAKING PRODUCTS CONTAINING BIOTIN (commonly found in Hair, Skin, Nails vitamins) AS IT INTERFERES WITH THE VALIDITY OF THYROID FUNCTION BLOOD TESTS.     -Patient is advised to maintain close follow up with Donita Brooks, MD for primary care needs.    I spent  25  minutes in the care of the patient today including review of labs from Thyroid Function, CMP, and other relevant labs ;  imaging/biopsy records (current and previous including abstractions from other facilities); face-to-face time discussing  his lab results and symptoms, medications doses, his options of short and long term treatment based on the latest standards of care / guidelines;   and documenting the encounter.  Kyle Campbell  participated in the discussions, expressed understanding, and voiced agreement with the above plans.  All questions were answered to his satisfaction. he is encouraged to contact clinic should he have any questions or concerns prior to his return visit.  Follow up plan: Return in about 3  months (around 03/30/2023) for Thyroid follow up, Previsit labs.   Thank you for involving me in the care of this pleasant patient, and I will continue to update you with his progress.   Ronny Bacon, [facility] [address] [place], [place] [address] Phone: [phone] Fax: [phone]  12/28/2022, 8:50 AM

## 2023-01-03 ENCOUNTER — Other Ambulatory Visit: Payer: Self-pay

## 2023-01-05 ENCOUNTER — Other Ambulatory Visit: Payer: Self-pay | Admitting: Family Medicine

## 2023-01-05 ENCOUNTER — Other Ambulatory Visit: Payer: Self-pay

## 2023-01-06 ENCOUNTER — Other Ambulatory Visit: Payer: Self-pay

## 2023-01-06 NOTE — Telephone Encounter (Signed)
Requested medication (s) are due for refill today: Yes  Requested medication (s) are on the active medication list: Yes  Last refill:  12/27/22  Future visit scheduled: Yes  Notes to clinic:  Unable to refill per protocol, cannot delegate.      Requested Prescriptions  Pending Prescriptions Disp Refills   traMADol (ULTRAM) 50 MG tablet 90 tablet 0    Sig: Take 1 tablet (50 mg total) by mouth every 6 (six) hours as needed. for pain     Not Delegated - Analgesics:  Opioid Agonists Failed - 01/05/2023 10:31 AM      Failed - This refill cannot be delegated      Failed - Urine Drug Screen completed in last 360 days      Failed - Valid encounter within last 3 months    Recent Outpatient Visits           1 year ago Controlled type 2 diabetes mellitus without complication, without long-term current use of insulin (HCC)   [facility] [name], Priscille Heidelberg, MD   1 year ago Testosterone deficiency   [facility] Medicine Donita Brooks, MD   2 years ago Uncontrolled type 2 diabetes mellitus with hyperglycemia (HCC)   [facility] Medicine Donita Brooks, MD   2 years ago Uncontrolled type 2 diabetes mellitus with hyperglycemia (HCC)   [facility] [name], Priscille Heidelberg, MD   2 years ago Chronic fatigue   [facility] [name], Priscille Heidelberg, MD       Future Appointments             In 4 weeks [name], Priscille Heidelberg, MD [facility], [facility]

## 2023-01-07 ENCOUNTER — Other Ambulatory Visit: Payer: Self-pay

## 2023-01-07 MED FILL — Tramadol HCl Tab 50 MG: ORAL | 23 days supply | Qty: 90 | Fill #0 | Status: CN

## 2023-01-11 ENCOUNTER — Other Ambulatory Visit: Payer: Self-pay

## 2023-01-12 ENCOUNTER — Other Ambulatory Visit: Payer: Self-pay

## 2023-01-12 ENCOUNTER — Telehealth: Payer: Self-pay

## 2023-01-12 NOTE — Telephone Encounter (Signed)
PA FOR TRULICITY: Kyle Campbell (Key: [facility])  Your information has been submitted to [employer]. To check for an updated outcome later, reopen this PA request from your dashboard.  If [employer] has not responded to your request within 24 hours, contact [employer] at [phone]. If you think there may be a problem with your PA request, use our live chat feature at the bottom right.

## 2023-01-13 ENCOUNTER — Other Ambulatory Visit: Payer: Self-pay

## 2023-01-13 NOTE — Telephone Encounter (Signed)
Outcome Approved on September 4 by [facility] NCPDP [id number]  Your PA request has been approved. Additional information will be provided in the approval communication. (Message [id number]) Authorization Expiration Date: 01/10/2026 Drug Trulicity 1.5MG /0.5ML pen-injectors  Form [employer] Electronic PA Form [phone] NCPDP) Original Claim Info 75 PRIOR AUTH REQ-MD CALL [phone].DRUG REQUIRES PRIOR AUTHORIZATION

## 2023-01-14 ENCOUNTER — Other Ambulatory Visit: Payer: Self-pay

## 2023-01-18 ENCOUNTER — Other Ambulatory Visit: Payer: Self-pay

## 2023-01-20 ENCOUNTER — Telehealth: Payer: Self-pay | Admitting: *Deleted

## 2023-01-20 NOTE — Telephone Encounter (Signed)
Patient was called and made aware. He will try skipping one dose per week for a few weeks and see how that does before he repeats lab work.

## 2023-01-20 NOTE — Telephone Encounter (Signed)
Patient left a message that he is not feeling well. Talked with the patient. He shares that he has not been feeling well. Mood swing-ill , jittery, shakes sometimes, no energy.This morning at 4 am he woke up because he was so hot , then cooled down. He is taking the Levothyroxine 50 mcg. He shares that his last labs were good and the plan was to wait until November before any changes made. He is not sure that he can wait until then.

## 2023-01-20 NOTE — Telephone Encounter (Signed)
Have him skip 1 dose per week and see how he feels.  We can always recheck labs too if he prefers.

## 2023-01-31 ENCOUNTER — Other Ambulatory Visit: Payer: Self-pay | Admitting: Family Medicine

## 2023-01-31 ENCOUNTER — Other Ambulatory Visit: Payer: Self-pay

## 2023-01-31 MED FILL — Tramadol HCl Tab 50 MG: ORAL | 23 days supply | Qty: 90 | Fill #0 | Status: AC

## 2023-02-01 ENCOUNTER — Other Ambulatory Visit: Payer: Self-pay

## 2023-02-01 MED ORDER — FLUTICASONE-SALMETEROL 250-50 MCG/ACT IN AEPB
1.0000 | INHALATION_SPRAY | Freq: Two times a day (BID) | RESPIRATORY_TRACT | 2 refills | Status: DC
  Filled 2023-02-01: qty 60, 30d supply, fill #0
  Filled 2023-03-07: qty 60, 30d supply, fill #1
  Filled 2023-04-11: qty 60, 30d supply, fill #2

## 2023-02-01 NOTE — Telephone Encounter (Signed)
Requested Prescriptions  Pending Prescriptions Disp Refills   fluticasone-salmeterol (ADVAIR) 250-50 MCG/ACT AEPB 60 each 2    Sig: Inhale 1 puff into the lungs 2 (two) times daily.     Pulmonology:  Combination Products Failed - 01/31/2023  8:46 AM      Failed - Valid encounter within last 12 months    Recent Outpatient Visits           1 year ago Controlled type 2 diabetes mellitus without complication, without long-term current use of insulin (HCC)   [facility] [name], Priscille Heidelberg, MD   2 years ago Testosterone deficiency   [facility] Donita Brooks, MD   2 years ago Uncontrolled type 2 diabetes mellitus with hyperglycemia (HCC)   [facility] Donita Brooks, MD   2 years ago Uncontrolled type 2 diabetes mellitus with hyperglycemia (HCC)   [facility] [name], Priscille Heidelberg, MD   2 years ago Chronic fatigue   [facility] [name], Priscille Heidelberg, MD       Future Appointments             In 3 weeks [name], Priscille Heidelberg, MD [facility], [facility]

## 2023-02-04 ENCOUNTER — Ambulatory Visit: Payer: BC Managed Care – PPO | Admitting: Family Medicine

## 2023-02-10 ENCOUNTER — Other Ambulatory Visit: Payer: Self-pay | Admitting: Nurse Practitioner

## 2023-02-10 ENCOUNTER — Other Ambulatory Visit: Payer: Self-pay

## 2023-02-10 DIAGNOSIS — E890 Postprocedural hypothyroidism: Secondary | ICD-10-CM

## 2023-02-10 MED ORDER — LEVOTHYROXINE SODIUM 50 MCG PO TABS
50.0000 ug | ORAL_TABLET | Freq: Every day | ORAL | 0 refills | Status: DC
Start: 2023-02-10 — End: 2023-03-31
  Filled 2023-02-10: qty 90, 90d supply, fill #0

## 2023-02-15 ENCOUNTER — Other Ambulatory Visit (HOSPITAL_COMMUNITY): Payer: Self-pay

## 2023-02-16 ENCOUNTER — Other Ambulatory Visit (HOSPITAL_BASED_OUTPATIENT_CLINIC_OR_DEPARTMENT_OTHER): Payer: Self-pay

## 2023-02-23 ENCOUNTER — Other Ambulatory Visit: Payer: BC Managed Care – PPO

## 2023-02-25 ENCOUNTER — Ambulatory Visit: Payer: BC Managed Care – PPO | Admitting: Family Medicine

## 2023-03-07 ENCOUNTER — Other Ambulatory Visit: Payer: Self-pay

## 2023-03-07 ENCOUNTER — Other Ambulatory Visit: Payer: Self-pay | Admitting: Family Medicine

## 2023-03-07 MED ORDER — TRAMADOL HCL 50 MG PO TABS
50.0000 mg | ORAL_TABLET | Freq: Four times a day (QID) | ORAL | 0 refills | Status: DC | PRN
  Filled 2023-03-07: qty 90, 23d supply, fill #0

## 2023-03-07 MED ORDER — ALBUTEROL SULFATE HFA 108 (90 BASE) MCG/ACT IN AERS
INHALATION_SPRAY | RESPIRATORY_TRACT | 0 refills | Status: DC | PRN
  Filled 2023-03-07: qty 6.7, 34d supply, fill #0

## 2023-03-14 ENCOUNTER — Other Ambulatory Visit: Payer: BC Managed Care – PPO

## 2023-03-14 DIAGNOSIS — E785 Hyperlipidemia, unspecified: Secondary | ICD-10-CM

## 2023-03-14 DIAGNOSIS — E349 Endocrine disorder, unspecified: Secondary | ICD-10-CM

## 2023-03-14 DIAGNOSIS — E0590 Thyrotoxicosis, unspecified without thyrotoxic crisis or storm: Secondary | ICD-10-CM

## 2023-03-14 DIAGNOSIS — E118 Type 2 diabetes mellitus with unspecified complications: Secondary | ICD-10-CM

## 2023-03-14 DIAGNOSIS — R5383 Other fatigue: Secondary | ICD-10-CM

## 2023-03-15 LAB — CBC WITH DIFFERENTIAL/PLATELET
Absolute Lymphocytes: 2125 {cells}/uL (ref 850–3900)
Absolute Monocytes: 462 {cells}/uL (ref 200–950)
Basophils Absolute: 62 {cells}/uL (ref 0–200)
Basophils Relative: 0.8 %
Eosinophils Absolute: 393 {cells}/uL (ref 15–500)
Eosinophils Relative: 5.1 %
HCT: 44.2 % (ref 38.5–50.0)
Hemoglobin: 14.8 g/dL (ref 13.2–17.1)
MCH: 31.7 pg (ref 27.0–33.0)
MCHC: 33.5 g/dL (ref 32.0–36.0)
MCV: 94.6 fL (ref 80.0–100.0)
MPV: 9.9 fL (ref 7.5–12.5)
Monocytes Relative: 6 %
Neutro Abs: 4659 {cells}/uL (ref 1500–7800)
Neutrophils Relative %: 60.5 %
Platelets: 195 10*3/uL (ref 140–400)
RBC: 4.67 10*6/uL (ref 4.20–5.80)
RDW: 13 % (ref 11.0–15.0)
Total Lymphocyte: 27.6 %
WBC: 7.7 10*3/uL (ref 3.8–10.8)

## 2023-03-15 LAB — COMPLETE METABOLIC PANEL WITH GFR
AG Ratio: 2 (calc) (ref 1.0–2.5)
ALT: 20 U/L (ref 9–46)
AST: 14 U/L (ref 10–35)
Albumin: 4.5 g/dL (ref 3.6–5.1)
Alkaline phosphatase (APISO): 78 U/L (ref 35–144)
BUN: 14 mg/dL (ref 7–25)
CO2: 28 mmol/L (ref 20–32)
Calcium: 9.3 mg/dL (ref 8.6–10.3)
Chloride: 103 mmol/L (ref 98–110)
Creat: 0.98 mg/dL (ref 0.70–1.35)
Globulin: 2.2 g/dL (ref 1.9–3.7)
Glucose, Bld: 132 mg/dL — ABNORMAL HIGH (ref 65–99)
Potassium: 4.5 mmol/L (ref 3.5–5.3)
Sodium: 139 mmol/L (ref 135–146)
Total Bilirubin: 0.4 mg/dL (ref 0.2–1.2)
Total Protein: 6.7 g/dL (ref 6.1–8.1)
eGFR: 88 mL/min/{1.73_m2} (ref >=60)

## 2023-03-15 LAB — LIPID PANEL
Cholesterol: 160 mg/dL (ref <200)
HDL: 44 mg/dL (ref >=40)
LDL Cholesterol (Calc): 89 mg/dL
Non-HDL Cholesterol (Calc): 116 mg/dL (ref <130)
Total CHOL/HDL Ratio: 3.6 (calc) (ref <5.0)
Triglycerides: 175 mg/dL — ABNORMAL HIGH (ref <150)

## 2023-03-15 LAB — TSH: TSH: 0.29 m[IU]/L — ABNORMAL LOW (ref 0.40–4.50)

## 2023-03-15 LAB — TESTOSTERONE: Testosterone: 220 ng/dL — ABNORMAL LOW (ref 250–827)

## 2023-03-15 LAB — HEMOGLOBIN A1C
Hgb A1c MFr Bld: 6.7 %{Hb} — ABNORMAL HIGH (ref <5.7)
Mean Plasma Glucose: 146 mg/dL
eAG (mmol/L): 8.1 mmol/L

## 2023-03-17 ENCOUNTER — Other Ambulatory Visit: Payer: Self-pay | Admitting: Family Medicine

## 2023-03-18 ENCOUNTER — Other Ambulatory Visit: Payer: Self-pay | Admitting: Family Medicine

## 2023-03-18 ENCOUNTER — Other Ambulatory Visit: Payer: Self-pay

## 2023-03-18 NOTE — Telephone Encounter (Signed)
Requested medications are due for refill today.  yes  Requested medications are on the active medications list.  yes  Last refill. 02/09/2022 6mL 3 rf  Future visit scheduled.   no  Notes to clinic.  Pt last seen 08/02/2022. Pt is due for CPE.    Requested Prescriptions  Pending Prescriptions Disp Refills   TRULICITY 1.5 MG/0.5ML SOAJ [Pharmacy Med Name: Dulaglutide (TRULICITY) 1.5 MG/0.5ML Solution Pen-injector] 6 mL 3    Sig: Inject 1.5 mg into the skin once a week.     Endocrinology:  Diabetes - GLP-1 Receptor Agonists Failed - 03/18/2023 10:07 AM      Failed - Valid encounter within last 6 months    Recent Outpatient Visits           1 year ago Controlled type 2 diabetes mellitus without complication, without long-term current use of insulin (HCC)   [facility] [name], Priscille Heidelberg, MD   2 years ago Testosterone deficiency   [facility] [name], Priscille Heidelberg, MD   2 years ago Uncontrolled type 2 diabetes mellitus with hyperglycemia (HCC)   [facility] [name], Priscille Heidelberg, MD   2 years ago Uncontrolled type 2 diabetes mellitus with hyperglycemia (HCC)   [facility] [name], Priscille Heidelberg, MD   2 years ago Chronic fatigue   [facility] [name], Priscille Heidelberg, MD              Passed - HBA1C is between 0 and 7.9 and within 180 days    Hgb A1c MFr Bld  Date Value Ref Range Status  03/14/2023 6.7 (H) <5.7 % of total Hgb Final    Comment:    For someone without known diabetes, a hemoglobin A1c value of 6.5% or greater indicates that they may have  diabetes and this should be confirmed with a follow-up  test. . For someone with known diabetes, a value <7% indicates  that their diabetes is well controlled and a value  greater than or equal to 7% indicates suboptimal  control. A1c targets should be individualized based on  duration of diabetes, age, comorbid conditions, and  other  considerations. . Currently, no consensus exists regarding use of hemoglobin A1c for diagnosis of diabetes for children. Marland Kitchen

## 2023-03-19 ENCOUNTER — Other Ambulatory Visit: Payer: Self-pay

## 2023-03-21 ENCOUNTER — Other Ambulatory Visit: Payer: Self-pay

## 2023-03-22 ENCOUNTER — Other Ambulatory Visit: Payer: Self-pay

## 2023-03-24 ENCOUNTER — Other Ambulatory Visit: Payer: Self-pay

## 2023-03-25 ENCOUNTER — Encounter: Payer: Self-pay | Admitting: Family Medicine

## 2023-03-25 ENCOUNTER — Ambulatory Visit: Payer: BC Managed Care – PPO | Admitting: Family Medicine

## 2023-03-25 ENCOUNTER — Other Ambulatory Visit: Payer: Self-pay

## 2023-03-25 VITALS — BP 124/74 | HR 71 | Temp 98.5°F | Ht 73.0 in | Wt 223.5 lb

## 2023-03-25 DIAGNOSIS — E785 Hyperlipidemia, unspecified: Secondary | ICD-10-CM | POA: Diagnosis not present

## 2023-03-25 DIAGNOSIS — E118 Type 2 diabetes mellitus with unspecified complications: Secondary | ICD-10-CM

## 2023-03-25 DIAGNOSIS — Z5320 Procedure and treatment not carried out because of patient's decision for unspecified reasons: Secondary | ICD-10-CM

## 2023-03-25 DIAGNOSIS — Z7985 Long-term (current) use of injectable non-insulin antidiabetic drugs: Secondary | ICD-10-CM | POA: Diagnosis not present

## 2023-03-25 LAB — TSH: TSH: 0.495 u[IU]/mL (ref 0.450–4.500)

## 2023-03-25 LAB — T4, FREE: Free T4: 1.06 ng/dL (ref 0.82–1.77)

## 2023-03-25 MED ORDER — TRULICITY 3 MG/0.5ML ~~LOC~~ SOAJ
3.0000 mg | SUBCUTANEOUS | 3 refills | Status: DC
  Filled 2023-03-25: qty 6, 84d supply, fill #0
  Filled 2023-07-05: qty 6, 84d supply, fill #1

## 2023-03-25 NOTE — Progress Notes (Signed)
Subjective:    Patient ID: Kyle Campbell, male    DOB: 24-Nov-1960, 62 y.o.   MRN: [id number]  Patient is a 62 year old Caucasian male with a history of type 2 diabetes.  Patient's most recent A1c is 6.7.  This was on Trulicity 1.5 mg subcu weekly.  He stopped taking metformin due to GI upset.  He is no longer on a statin.  He does not want to take a statin.  He had a coronary artery calcium score earlier this year placing him in the 63rd percentile.  I used that to encourage the patient to take Crestor however he refuses statin therapy.  He denies any chest pain shortness of breath or dyspnea exertion.  Blood pressure today is well-controlled 124/74 Lab on 03/14/2023  Component Date Value Ref Range Status   WBC 03/14/2023 7.7  3.8 - 10.8 Thousand/uL Final   RBC 03/14/2023 4.67  4.20 - 5.80 Million/uL Final   Hemoglobin 03/14/2023 14.8  13.2 - 17.1 g/dL Final   HCT 64/40/3474 44.2  38.5 - 50.0 % Final   MCV 03/14/2023 94.6  80.0 - 100.0 fL Final   MCH 03/14/2023 31.7  27.0 - 33.0 pg Final   MCHC 03/14/2023 33.5  32.0 - 36.0 g/dL Final   Comment: For adults, a slight decrease in the calculated MCHC value (in the range of 30 to 32 g/dL) is most likely not clinically significant; however, it should be interpreted with caution in correlation with other red cell parameters and the patient's clinical condition.    RDW 03/14/2023 13.0  11.0 - 15.0 % Final   Platelets 03/14/2023 195  140 - 400 Thousand/uL Final   MPV 03/14/2023 9.9  7.5 - 12.5 fL Final   Neutro Abs 03/14/2023 4,659  1,500 - 7,800 cells/uL Final   Absolute Lymphocytes 03/14/2023 2,125  850 - 3,900 cells/uL Final   Absolute Monocytes 03/14/2023 462  200 - 950 cells/uL Final   Eosinophils Absolute 03/14/2023 393  15 - 500 cells/uL Final   Basophils Absolute 03/14/2023 62  0 - 200 cells/uL Final   Neutrophils Relative % 03/14/2023 60.5  % Final   Total Lymphocyte 03/14/2023 27.6  % Final   Monocytes Relative 03/14/2023 6.0  %  Final   Eosinophils Relative 03/14/2023 5.1  % Final   Basophils Relative 03/14/2023 0.8  % Final   Glucose, Bld 03/14/2023 132 (H)  65 - 99 mg/dL Final   Comment: .            Fasting reference interval . For someone without known diabetes, a glucose value >125 mg/dL indicates that they may have diabetes and this should be confirmed with a follow-up test. .    BUN 03/14/2023 14  7 - 25 mg/dL Final   Creat 25/95/6387 0.98  0.70 - 1.35 mg/dL Final   eGFR 56/43/3295 88  > OR = 60 mL/min/1.42m2 Final   BUN/Creatinine Ratio 03/14/2023 SEE NOTE:  6 - 22 (calc) Final   Comment:    Not Reported: BUN and Creatinine are within    reference range. .    Sodium 03/14/2023 139  135 - 146 mmol/L Final   Potassium 03/14/2023 4.5  3.5 - 5.3 mmol/L Final   Chloride 03/14/2023 103  98 - 110 mmol/L Final   CO2 03/14/2023 28  20 - 32 mmol/L Final   Calcium 03/14/2023 9.3  8.6 - 10.3 mg/dL Final   Total Protein 18/84/1660 6.7  6.1 - 8.1 g/dL Final   Albumin  03/14/2023 4.5  3.6 - 5.1 g/dL Final   Globulin 16/02/9603 2.2  1.9 - 3.7 g/dL (calc) Final   AG Ratio 03/14/2023 2.0  1.0 - 2.5 (calc) Final   Total Bilirubin 03/14/2023 0.4  0.2 - 1.2 mg/dL Final   Alkaline phosphatase (APISO) 03/14/2023 78  35 - 144 U/L Final   AST 03/14/2023 14  10 - 35 U/L Final   ALT 03/14/2023 20  9 - 46 U/L Final   Hgb A1c MFr Bld 03/14/2023 6.7 (H)  <5.7 % of total Hgb Final   Comment: For someone without known diabetes, a hemoglobin A1c value of 6.5% or greater indicates that they may have  diabetes and this should be confirmed with a follow-up  test. . For someone with known diabetes, a value <7% indicates  that their diabetes is well controlled and a value  greater than or equal to 7% indicates suboptimal  control. A1c targets should be individualized based on  duration of diabetes, age, comorbid conditions, and  other considerations. . Currently, no consensus exists regarding use of hemoglobin A1c for  diagnosis of diabetes for children. .    Mean Plasma Glucose 03/14/2023 146  mg/dL Final   eAG (mmol/L) 54/01/8118 8.1  mmol/L Final   Cholesterol 03/14/2023 160  <200 mg/dL Final   HDL 14/78/2956 44  > OR = 40 mg/dL Final   Triglycerides 21/30/8657 175 (H)  <150 mg/dL Final   LDL Cholesterol (Calc) 03/14/2023 89  mg/dL (calc) Final   Comment: Reference range: <100 . Desirable range <100 mg/dL for primary prevention;   <70 mg/dL for patients with CHD or diabetic patients  with > or = 2 CHD risk factors. Marland Kitchen LDL-C is now calculated using the Martin-Hopkins  calculation, which is a validated novel method providing  better accuracy than the Friedewald equation in the  estimation of LDL-C.  Horald Pollen et al. Lenox Ahr. 8469;629(52): 2061-2068  (http://education.[facility].com/faq/FAQ164)    Total CHOL/HDL Ratio 03/14/2023 3.6  <8.4 (calc) Final   Non-HDL Cholesterol (Calc) 03/14/2023 116  <130 mg/dL (calc) Final   Comment: For patients with diabetes plus 1 major ASCVD risk  factor, treating to a non-HDL-C goal of <100 mg/dL  (LDL-C of <13 mg/dL) is considered a therapeutic  option.    TSH 03/14/2023 0.29 (L)  0.40 - 4.50 mIU/L Final   Testosterone 03/14/2023 220 (L)  250 - 827 ng/dL Final   Comment: In hypogonadal males, Testosterone, Total, LC/MS/MS, is the recommended assay due to the diminished accuracy of immunoassay at levels below 250 ng/dL. This test code (24401) must be collected in a red-top tube with no gel.     Patient is here today to review his labs.  I explained to the patient that they recommend every diabetic patient be on a statin.  However he is hesitant to take additional medication given the fact that his cholesterol is not elevated.  He denies any chest pain shortness of breath or dyspnea on exertion  Past Medical History:  Diagnosis Date   Asthma    Diabetes mellitus without complication (HCC)    Erectile dysfunction    GERD (gastroesophageal reflux  disease)    Hyperlipidemia    Obstructive sleep apnea    compliant with CPAP   Testosterone deficiency    Past Surgical History:  Procedure Laterality Date   TONSILLECTOMY     age 62   Current Outpatient Medications on File Prior to Visit  Medication Sig Dispense Refill   albuterol (VENTOLIN  HFA) 108 (90 Base) MCG/ACT inhaler INHALE 1 PUFF BY MOUTH ONCE EVERY 4 HOURS AS NEEDED. 6.7 g 0   Dulaglutide (TRULICITY) 1.5 MG/0.5ML SOPN Inject 1.5 mg into the skin once a week. 6 mL 3   fluticasone-salmeterol (ADVAIR) 250-50 MCG/ACT AEPB Inhale 1 puff into the lungs 2 (two) times daily. 60 each 2   glucose blood (ACCU-CHEK AVIVA PLUS) test strip Use as instructed 100 each 12   Lancets (ACCU-CHEK MULTICLIX) lancets Use as instructed 100 each 12   levothyroxine (SYNTHROID) 50 MCG tablet Take 1 tablet (50 mcg total) by mouth daily. 90 tablet 0   metFORMIN (GLUCOPHAGE) 500 MG tablet Take 1 tablet (500 mg total) by mouth 2 (two) times daily with a meal. 180 tablet 3   rosuvastatin (CRESTOR) 20 MG tablet Take 1 tablet (20 mg total) by mouth daily. (Patient not taking: Reported on 10/26/2022) 90 tablet 1   sildenafil (VIAGRA) 100 MG tablet Take 1/2-1 tablets (50-100 mg total) by mouth daily as needed for erectile dysfunction. 5 tablet 11   traMADol (ULTRAM) 50 MG tablet Take 1 tablet (50 mg total) by mouth every 6 (six) hours as needed for pain. 90 tablet 0   UNABLE TO FIND CPAP mask - DX: OSA G47.33 1 Device 0   No current facility-administered medications on file prior to visit.       No Known Allergies Social History   Socioeconomic History   Marital status: Married    Spouse name: Not on file   Number of children: Not on file   Years of education: Not on file   Highest education level: 12th grade  Occupational History   Not on file  Tobacco Use   Smoking status: Never   Smokeless tobacco: Never  Vaping Use   Vaping status: Never Used  Substance and Sexual Activity   Alcohol use: Yes     Alcohol/week: 6.0 standard drinks of alcohol    Types: 1 Standard drinks or equivalent, 5 Shots of liquor per week    Comment: weekend only   Drug use: No   Sexual activity: Yes    Comment: Married.    Other Topics Concern   Not on file  Social History Narrative   Has 3 children. One 30 year, and 60 year old twins. 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    Social Determinants of Health   Financial Resource Strain: Low Risk  (08/01/2022)   Overall Financial Resource Strain (CARDIA)    Difficulty of Paying Living Expenses: Not hard at all  Food Insecurity: No Food Insecurity (08/01/2022)   Hunger Vital Sign    Worried About Running Out of Food in the Last Year: Never true    Ran Out of Food in the Last Year: Never true  Transportation Needs: No Transportation Needs (08/01/2022)   PRAPARE - Administrator, Civil Service (Medical): No    Lack of Transportation (Non-Medical): No  Physical Activity: Unknown (08/01/2022)   Exercise Vital Sign    Days of Exercise per Week: Patient declined    Minutes of Exercise per Session: Not on file  Stress: No Stress Concern Present (08/01/2022)   [employer] of Occupational Health - Occupational Stress Questionnaire    Feeling of Stress : Only a little  Social Connections: Socially Integrated (08/01/2022)   Social Connection and Isolation Panel [NHANES]    Frequency of Communication with Friends and Family: More than three times a week    Frequency of Social  Gatherings with Friends and Family: Once a week    Attends Religious Services: More than 4 times per year    Active Member of [employer] or Organizations: Yes    Attends Engineer, structural: More than 4 times per year    Marital Status: Married  Catering manager Violence: Not on file      Review of Systems  All other systems reviewed and are negative.      Objective:   Physical Exam Constitutional:      General: He is not in acute distress.    Appearance:  He is well-developed. He is not diaphoretic.  Eyes:     Conjunctiva/sclera: Conjunctivae normal.     Pupils: Pupils are equal, round, and reactive to light.  Neck:     Thyroid: No thyromegaly.     Vascular: No JVD.  Cardiovascular:     Rate and Rhythm: Normal rate and regular rhythm.     Heart sounds: Normal heart sounds. No murmur heard.    No friction rub. No gallop.  Pulmonary:     Effort: Pulmonary effort is normal. No respiratory distress.     Breath sounds: No decreased air movement. No wheezing, rhonchi or rales.  Abdominal:     General: Bowel sounds are normal. There is no distension.     Palpations: Abdomen is soft.     Tenderness: There is no abdominal tenderness. There is no rebound.  Genitourinary:    Prostate: Normal.     Rectum: Normal.  Musculoskeletal:     Cervical back: Neck supple.  Lymphadenopathy:     Cervical: No cervical adenopathy.  Neurological:     Mental Status: He is alert and oriented to person, place, and time.     Cranial Nerves: No cranial nerve deficit.     Motor: No abnormal muscle tone.     Coordination: Coordination normal.     Deep Tendon Reflexes: Reflexes are normal and symmetric.           Assessment & Plan:  Controlled type 2 diabetes mellitus with complication, without long-term current use of insulin (HCC)  Dyslipidemia  Refusal of statin medication by patient Despite his diabetes and elevated coronary artery calcium score, the patient declines a statin.  I tried to convince him to take the Crestor.  He denies any side effects from the medication.  He states that he will consider this.  Blood pressure today is well-controlled.  I will increase Trulicity to 3 mg subcu weekly try to get A1c less than 6.5.

## 2023-03-29 NOTE — Patient Instructions (Signed)

## 2023-03-30 ENCOUNTER — Other Ambulatory Visit: Payer: Self-pay

## 2023-03-31 ENCOUNTER — Encounter: Payer: Self-pay | Admitting: Nurse Practitioner

## 2023-03-31 ENCOUNTER — Other Ambulatory Visit: Payer: Self-pay

## 2023-03-31 ENCOUNTER — Ambulatory Visit: Payer: BC Managed Care – PPO | Admitting: Nurse Practitioner

## 2023-03-31 VITALS — BP 125/68 | HR 66 | Ht 73.0 in | Wt 225.4 lb

## 2023-03-31 DIAGNOSIS — E890 Postprocedural hypothyroidism: Secondary | ICD-10-CM | POA: Diagnosis not present

## 2023-03-31 MED ORDER — LEVOTHYROXINE SODIUM 50 MCG PO TABS
50.0000 ug | ORAL_TABLET | Freq: Every day | ORAL | 1 refills | Status: DC
  Filled 2023-03-31 – 2023-05-19 (×2): qty 90, 90d supply, fill #0

## 2023-03-31 NOTE — Progress Notes (Signed)
03/31/2023     Endocrinology Follow Up Note    Subjective:    Patient ID: Kyle Campbell, male    DOB: [birth year], PCP Kyle Brooks, MD.   Past Medical History:  Diagnosis Date   Asthma    Diabetes mellitus without complication (HCC)    Erectile dysfunction    GERD (gastroesophageal reflux disease)    Hyperlipidemia    Obstructive sleep apnea    compliant with CPAP   Testosterone deficiency     Past Surgical History:  Procedure Laterality Date   TONSILLECTOMY     age 59    Social History   Socioeconomic History   Marital status: Married    Spouse name: Not on file   Number of children: Not on file   Years of education: Not on file   Highest education level: 12th grade  Occupational History   Not on file  Tobacco Use   Smoking status: Never   Smokeless tobacco: Never  Vaping Use   Vaping status: Never Used  Substance and Sexual Activity   Alcohol use: Yes    Alcohol/week: 6.0 standard drinks of alcohol    Types: 1 Standard drinks or equivalent, 5 Shots of liquor per week    Comment: weekend only   Drug use: No   Sexual activity: Yes    Comment: Married.    Other Topics Concern   Not on file  Social History Narrative   Has 3 children. One 37 year, and 48 year old twins. 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    Social Determinants of Health   Financial Resource Strain: Low Risk  (08/01/2022)   Overall Financial Resource Strain (CARDIA)    Difficulty of Paying Living Expenses: Not hard at all  Food Insecurity: No Food Insecurity (08/01/2022)   Hunger Vital Sign    Worried About Running Out of Food in the Last Year: Never true    Ran Out of Food in the Last Year: Never true  Transportation Needs: No Transportation Needs (08/01/2022)   PRAPARE - Administrator, Civil Service (Medical): No    Lack of Transportation (Non-Medical): No  Physical Activity: Unknown (08/01/2022)   Exercise Vital Sign    Days of Exercise per Week:  Patient declined    Minutes of Exercise per Session: Not on file  Stress: No Stress Concern Present (08/01/2022)   Kyle Campbell [employer] Occupational Health - Occupational Stress Questionnaire    Feeling of Stress : Only a little  Social Connections: Socially Integrated (08/01/2022)   Social Connection and Isolation Panel [NHANES]    Frequency of Communication with Friends and Family: More than three times a week    Frequency of Social Gatherings with Friends and Family: Once a week    Attends Religious Services: More than 4 times per year    Active Member of [employer] or Organizations: Yes    Attends Engineer, structural: More than 4 times per year    Marital Status: Married    Family History  Problem Relation Age of Onset   Lung cancer Father     Outpatient Encounter Medications as of 03/31/2023  Medication Sig   albuterol (VENTOLIN HFA) 108 (90 Base) MCG/ACT inhaler INHALE 1 PUFF BY MOUTH ONCE EVERY 4 HOURS AS NEEDED.   Dulaglutide (TRULICITY) 3 MG/0.5ML SOAJ Inject 3 mg as directed once a week.   fluticasone-salmeterol (ADVAIR) 250-50 MCG/ACT AEPB Inhale 1 puff into the lungs 2 (two)  times daily.   glucose blood (ACCU-CHEK AVIVA PLUS) test strip Use as instructed   Lancets (ACCU-CHEK MULTICLIX) lancets Use as instructed   sildenafil (VIAGRA) 100 MG tablet Take 1/2-1 tablets (50-100 mg total) by mouth daily as needed for erectile dysfunction.   traMADol (ULTRAM) 50 MG tablet Take 1 tablet (50 mg total) by mouth every 6 (six) hours as needed for pain.   UNABLE TO FIND CPAP mask - DX: OSA G47.33   [DISCONTINUED] levothyroxine (SYNTHROID) 50 MCG tablet Take 1 tablet (50 mcg total) by mouth daily.   levothyroxine (SYNTHROID) 50 MCG tablet Take 1 tablet (50 mcg total) by mouth daily.   [DISCONTINUED] Dulaglutide (TRULICITY) 1.5 MG/0.5ML SOPN Inject 1.5 mg into the skin once a week. (Patient not taking: Reported on 03/31/2023)   [DISCONTINUED] rosuvastatin (CRESTOR) 20 MG tablet Take  1 tablet (20 mg total) by mouth daily. (Patient not taking: Reported on 10/26/2022)   No facility-administered encounter medications on file as of 03/31/2023.    ALLERGIES: No Known Allergies  VACCINATION STATUS: Immunization History  Administered Date(s) Administered   Influenza,inj,Quad PF,6+ Mos 04/08/2015, 01/19/2016, 04/21/2017, 03/01/2019   Influenza-Unspecified 05/20/2003   Td 04/26/2002   Tdap 02/02/2013     HPI  Kyle Campbell is 62 y.o. male who presents today with a medical history as above. he is being seen in consultation for hyperthyroidism requested by Kyle Brooks, MD.  he has been dealing with symptoms of unexplained weight loss, tremors, palpitations, heat intolerance, weakness, fatigue, irritability, dry eyes, dizziness, and diarrhea for about 6 months now. These symptoms are progressively worsening and troubling to him.   he denies dysphagia, choking, shortness of breath, no recent voice change.    he denies family history of thyroid dysfunction and denies family hx of thyroid cancer. He does not know his family history on his fathers side.  he denies personal history of goiter. he is not on any anti-thyroid medications nor on any thyroid hormone supplements. Denies use of Biotin containing supplements.  he is willing to proceed with appropriate work up and therapy for thyrotoxicosis.  He has never had any imaging of his thyroid in the past.   He is s/p RAI ablation on 05/14/22.   Review of systems  Constitutional: + Minimally fluctuating body weight,  current Body mass index is 29.74 kg/m. , no fatigue, no subjective hyperthermia, no subjective hypothermia Eyes: no blurry vision, no xerophthalmia ENT: no sore throat, no nodules palpated in throat, no dysphagia/odynophagia, no hoarseness Cardiovascular: no chest pain, no shortness of breath, no palpitations, no leg swelling Respiratory: no cough, no shortness of breath Gastrointestinal: no  nausea/vomiting/diarrhea Musculoskeletal: no muscle/joint aches Skin: no rashes, no hyperemia Neurological: no tremors, no numbness, no tingling, no dizziness Psychiatric: no depression, no anxiety   Objective:    BP 125/68 (BP Location: Left Arm, Patient Position: Sitting, Cuff Size: Large)   Pulse 66   Ht 6\' 1"  (1.854 m)   Wt 225 lb 6.4 oz (102.2 kg)   BMI 29.74 kg/m   Wt Readings from Last 3 Encounters:  03/31/23 225 lb 6.4 oz (102.2 kg)  03/25/23 223 lb 8 oz (101.4 kg)  12/28/22 220 lb 12.8 oz (100.2 kg)     BP Readings from Last 3 Encounters:  03/31/23 125/68  03/25/23 124/74  12/28/22 116/69                   Physical Exam- Limited  Constitutional:  Body mass index is 29.74  kg/m. , not in acute distress, normal state of mind Eyes:  EOMI, no exophthalmos Musculoskeletal: no gross deformities, strength intact in all four extremities, no gross restriction of joint movements Skin:  no rashes, no hyperemia Neurological: no tremor with outstretched hands   CMP     Component Value Date/Time   NA 139 03/14/2023 0804   K 4.5 03/14/2023 0804   CL 103 03/14/2023 0804   CO2 28 03/14/2023 0804   GLUCOSE 132 (H) 03/14/2023 0804   BUN 14 03/14/2023 0804   CREATININE 0.98 03/14/2023 0804   CALCIUM 9.3 03/14/2023 0804   PROT 6.7 03/14/2023 0804   ALBUMIN 4.5 09/20/2016 1018   AST 14 03/14/2023 0804   ALT 20 03/14/2023 0804   ALKPHOS 72 09/20/2016 1018   BILITOT 0.4 03/14/2023 0804   GFRNONAA 73 09/19/2020 1622   GFRAA 84 09/19/2020 1622     CBC    Component Value Date/Time   WBC 7.7 03/14/2023 0804   RBC 4.67 03/14/2023 0804   HGB 14.8 03/14/2023 0804   HCT 44.2 03/14/2023 0804   PLT 195 03/14/2023 0804   MCV 94.6 03/14/2023 0804   MCH 31.7 03/14/2023 0804   MCHC 33.5 03/14/2023 0804   RDW 13.0 03/14/2023 0804   LYMPHSABS 1,813 07/29/2022 0808   MONOABS 380 09/20/2016 1018   EOSABS 393 03/14/2023 0804   BASOSABS 62 03/14/2023 0804     Diabetic Labs  (most recent): Lab Results  Component Value Date   HGBA1C 6.7 (H) 03/14/2023   HGBA1C 7.0 (H) 06/28/2022   HGBA1C 9.2 (H) 01/29/2022   MICROALBUR 0.6 07/29/2022   MICROALBUR <0.2 01/05/2021   MICROALBUR 0.2 04/18/2020    Lipid Panel     Component Value Date/Time   CHOL 160 03/14/2023 0804   TRIG 175 (H) 03/14/2023 0804   HDL 44 03/14/2023 0804   CHOLHDL 3.6 03/14/2023 0804   VLDL 39 (H) 09/20/2016 1018   LDLCALC 89 03/14/2023 0804     Lab Results  Component Value Date   TSH 0.495 03/24/2023   TSH 0.29 (L) 03/14/2023   TSH 0.015 (L) 12/24/2022   TSH <0.005 (L) 10/25/2022   TSH 0.009 (L) 08/19/2022   TSH <0.005 (L) 06/21/2022   TSH <0.005 (L) 02/15/2022   TSH <0.005 (L) 01/22/2022   TSH <0.005 (L) 12/16/2021   TSH <0.01 (L) 10/26/2021   FREET4 1.06 03/24/2023   FREET4 1.32 12/24/2022   FREET4 1.46 10/25/2022   FREET4 0.78 (L) 08/19/2022   FREET4 2.65 (H) 06/21/2022   FREET4 1.39 02/15/2022   FREET4 1.76 01/22/2022   FREET4 2.73 (H) 12/16/2021   FREET4 1.6 11/03/2021   FREET4 5.3 10/17/2008      Uptake and Scan from 03/11/22 CLINICAL DATA:  Hyperthyroidism, weight loss, heat sensitivity, diarrhea, tremors, moodiness, increased thirst, palpitations, weakness, dry skin, brittle nails, suppressed TSH < 0.005   EXAM: THYROID SCAN AND UPTAKE - 4 AND 24 HOURS   TECHNIQUE: Following oral administration of I-123 capsule, anterior planar imaging was acquired at 24 hours. Thyroid uptake was calculated with a thyroid probe at 4-6 hours and 24 hours.   RADIOPHARMACEUTICALS:  453 uCi I-123 sodium iodide p.o.   COMPARISON:  None Available.   FINDINGS: Cold nodules identified at lateral aspect of inferior thyroid lobes bilaterally larger on LEFT.   No definite hot nodules.   4 hour I-123 uptake = 42.8% (normal 5-20%)   24 hour I-123 uptake = 57.5% (normal 10-30%)   IMPRESSION: Elevated 4 hour  and 24 hour radio iodine uptakes consistent with hyperthyroidism.    Cold nodules at the inferior poles of both thyroid lobes, larger on LEFT; thyroid ultrasound assessment recommended prior to consideration of radioactive iodine therapy.     Electronically Signed   By: Ulyses Southward M.D.   On: 03/11/2022 11:08        Latest Reference Range & Units 06/21/22 08:40 08/19/22 08:06 10/25/22 08:10 12/24/22 08:19 03/14/23 08:04 03/24/23 08:31  TSH 0.450 - 4.500 uIU/mL <0.005 (L) 0.009 (L) <0.005 (L) 0.015 (L) 0.29 (L) 0.495  Triiodothyronine,Free,Serum 2.0 - 4.4 pg/mL 6.8 (H) 2.3      T4,Free(Direct) 0.82 - 1.77 ng/dL 1.61 (H) 0.96 (L) 0.45 1.32  1.06  (L): Data is abnormally low (H): Data is abnormally high   Assessment & Plan:   1. Hypothyroidism-s/p RAI ablation for Graves disease 2. Thyroid antibody positive 3. Bilateral Cold thyroid nodules  he is being seen at a kind request of Kyle Brooks, MD.  his history and most recent labs are reviewed, and he was examined clinically. Subjective and objective findings are consistent with thyrotoxicosis likely from primary hyperthyroidism. The potential risks of untreated thyrotoxicosis and the need for definitive therapy have been discussed in detail with him, and he agrees to proceed with diagnostic workup and treatment plan.   His uptake and scan is consistent with Graves disease with elevated 4 and 24 hr uptakes.  He did have 2 cold nodules in bilateral lower poles which warrant evaluation prior to RAI ablation with thyroid ultrasound.  He agreed to proceed with this additional test with biopsy which was sent to [facility] and determined to be benign, with less than 4% chance of malignancy.  He underwent RAI ablation for hyperthyroidism on 05/14/22.    He called between visits with symptoms suggestive of over-replacement, therefore I asked him to skip 1 day of the week to see if it helped.  He reports improvement in his symptoms.  His previsit TFTs are consistent with appropriate hormone replacement.  He  is advised to continue his Levothyroxine 50 mcg po daily (skipping 1 day of the week).  Will recheck TFTs prior to next visit and adjust dose accordingly.   - The correct intake of thyroid hormone (Levothyroxine, Synthroid), is on empty stomach first thing in the morning, with water, separated by at least 30 minutes from breakfast and other medications,  and separated by more than 4 hours from calcium, iron, multivitamins, acid reflux medications (PPIs).  - This medication is a life-long medication and will be needed to correct thyroid hormone imbalances for the rest of your life.  The dose may change from time to time, based on thyroid blood work.  - It is extremely important to be consistent taking this medication, near the same time each morning.  -AVOID TAKING PRODUCTS CONTAINING BIOTIN (commonly found in Hair, Skin, Nails vitamins) AS IT INTERFERES WITH THE VALIDITY OF THYROID FUNCTION BLOOD TESTS.     -Patient is advised to maintain close follow up with Kyle Brooks, MD for primary care needs.     I spent  16  minutes in the care of the patient today including review of labs from Thyroid Function, CMP, and other relevant labs ; imaging/biopsy records (current and previous including abstractions from other facilities); face-to-face time discussing  his lab results and symptoms, medications doses, his options of short and long term treatment based on the latest standards of care / guidelines;   and documenting the  encounter.  Kyle Campbell  participated in the discussions, expressed understanding, and voiced agreement with the above plans.  All questions were answered to his satisfaction. he is encouraged to contact clinic should he have any questions or concerns prior to his return visit.  Follow up plan: Return in about 4 months (around 07/29/2023) for Thyroid follow up, Previsit labs.   Thank you for involving me in the care of this pleasant patient, and I will continue to  update you with his progress.   Ronny Bacon, [facility] [address] [place], [place] [address] Phone: [phone] Fax: [phone]  03/31/2023, 8:08 AM

## 2023-04-11 ENCOUNTER — Telehealth: Payer: Self-pay | Admitting: Family Medicine

## 2023-04-11 ENCOUNTER — Other Ambulatory Visit: Payer: Self-pay | Admitting: Family Medicine

## 2023-04-11 ENCOUNTER — Other Ambulatory Visit: Payer: Self-pay

## 2023-04-12 ENCOUNTER — Other Ambulatory Visit: Payer: Self-pay

## 2023-04-12 NOTE — Telephone Encounter (Signed)
Requested medication (s) are due for refill today - yes  Requested medication (s) are on the active medication list -yes  Future visit scheduled -yes  Last refill: 03/07/23 #90  Notes to clinic: non delegated Rx  Requested Prescriptions  Pending Prescriptions Disp Refills   traMADol (ULTRAM) 50 MG tablet 90 tablet 0    Sig: Take 1 tablet (50 mg total) by mouth every 6 (six) hours as needed for pain.     Not Delegated - Analgesics:  Opioid Agonists Failed - 04/11/2023 11:30 AM      Failed - This refill cannot be delegated      Failed - Urine Drug Screen completed in last 360 days      Failed - Valid encounter within last 3 months    Recent Outpatient Visits           1 year ago Controlled type 2 diabetes mellitus without complication, without long-term current use of insulin (HCC)   [facility] [name], Priscille Heidelberg, MD   2 years ago Testosterone deficiency   [facility] Donita Brooks, MD   2 years ago Uncontrolled type 2 diabetes mellitus with hyperglycemia (HCC)   [facility] Donita Brooks, MD   2 years ago Uncontrolled type 2 diabetes mellitus with hyperglycemia (HCC)   [facility] [name], Priscille Heidelberg, MD   2 years ago Chronic fatigue   [facility] Donita Brooks, MD       Future Appointments             In 5 months [name], Priscille Heidelberg, MD [facility]               Requested Prescriptions  Pending Prescriptions Disp Refills   traMADol (ULTRAM) 50 MG tablet 90 tablet 0    Sig: Take 1 tablet (50 mg total) by mouth every 6 (six) hours as needed for pain.     Not Delegated - Analgesics:  Opioid Agonists Failed - 04/11/2023 11:30 AM      Failed - This refill cannot be delegated      Failed - Urine Drug Screen completed in last 360 days      Failed - Valid encounter within last 3 months    Recent Outpatient Visits           1 year ago  Controlled type 2 diabetes mellitus without complication, without long-term current use of insulin (HCC)   [facility] [name], Priscille Heidelberg, MD   2 years ago Testosterone deficiency   [facility] Donita Brooks, MD   2 years ago Uncontrolled type 2 diabetes mellitus with hyperglycemia (HCC)   [facility] Donita Brooks, MD   2 years ago Uncontrolled type 2 diabetes mellitus with hyperglycemia (HCC)   [facility] [name], Priscille Heidelberg, MD   2 years ago Chronic fatigue   [facility] [name], Priscille Heidelberg, MD       Future Appointments             In 5 months [name], Priscille Heidelberg, MD [facility], [facility]

## 2023-04-13 ENCOUNTER — Other Ambulatory Visit: Payer: Self-pay

## 2023-04-14 ENCOUNTER — Other Ambulatory Visit: Payer: Self-pay

## 2023-04-15 ENCOUNTER — Other Ambulatory Visit: Payer: Self-pay

## 2023-04-19 ENCOUNTER — Other Ambulatory Visit: Payer: Self-pay

## 2023-04-20 ENCOUNTER — Other Ambulatory Visit: Payer: Self-pay

## 2023-04-21 ENCOUNTER — Other Ambulatory Visit: Payer: Self-pay

## 2023-04-22 ENCOUNTER — Other Ambulatory Visit: Payer: Self-pay

## 2023-04-22 ENCOUNTER — Telehealth: Payer: Self-pay | Admitting: Family Medicine

## 2023-04-22 ENCOUNTER — Other Ambulatory Visit: Payer: Self-pay | Admitting: Family Medicine

## 2023-04-22 MED ORDER — TRAMADOL HCL 50 MG PO TABS
50.0000 mg | ORAL_TABLET | Freq: Four times a day (QID) | ORAL | 0 refills | Status: DC | PRN
  Filled 2023-04-22: qty 90, 23d supply, fill #0

## 2023-04-22 NOTE — Telephone Encounter (Signed)
Copied from CRM [id number]. Topic: Clinical - Medication Refill >> Apr 22, 2023  8:15 AM Dimitri Ped wrote: Most Recent Primary Care Visit:  Provider: Lynnea Ferrier T  Department: [facility]  Visit Type: OFFICE VISIT  Date: 03/25/2023  Medication: ***  Has the patient contacted their pharmacy?  (Agent: If no, request that the patient contact the pharmacy for the refill. If patient does not wish to contact the pharmacy document the reason why and proceed with request.) (Agent: If yes, when and what did the pharmacy advise?)  Is this the correct pharmacy for this prescription?  If no, delete pharmacy and type the correct one.  This is the patient's preferred pharmacy:  [facility] [address] [place] [address] Phone: [phone] Fax: [phone]  [id number] [place], [place] - [address] [place] [address] Phone: [phone] Fax: [phone]   Has the prescription been filled recently?   Is the patient out of the medication?   Has the patient been seen for an appointment in the last year OR does the patient have an upcoming appointment?   Can we respond through MyChart?   Agent: Please be advised that Rx refills may take up to 3 business days. We ask that you follow-up with your pharmacy.

## 2023-05-19 ENCOUNTER — Other Ambulatory Visit: Payer: Self-pay

## 2023-05-19 ENCOUNTER — Other Ambulatory Visit: Payer: Self-pay | Admitting: Family Medicine

## 2023-05-19 MED FILL — Fluticasone-Salmeterol Aer Powder BA 250-50 MCG/ACT: RESPIRATORY_TRACT | 30 days supply | Qty: 60 | Fill #0 | Status: AC

## 2023-05-20 ENCOUNTER — Other Ambulatory Visit: Payer: Self-pay

## 2023-05-29 ENCOUNTER — Other Ambulatory Visit: Payer: Self-pay | Admitting: Family Medicine

## 2023-05-30 ENCOUNTER — Other Ambulatory Visit: Payer: Self-pay | Admitting: Family Medicine

## 2023-05-30 ENCOUNTER — Other Ambulatory Visit: Payer: Self-pay

## 2023-05-31 ENCOUNTER — Other Ambulatory Visit: Payer: Self-pay

## 2023-05-31 MED FILL — Tramadol HCl Tab 50 MG: ORAL | 23 days supply | Qty: 90 | Fill #0 | Status: AC

## 2023-05-31 MED FILL — Albuterol Sulfate Inhal Aero 108 MCG/ACT (90MCG Base Equiv): RESPIRATORY_TRACT | 33 days supply | Qty: 6.7 | Fill #0 | Status: AC

## 2023-06-21 ENCOUNTER — Other Ambulatory Visit: Payer: Self-pay

## 2023-06-21 MED FILL — Fluticasone-Salmeterol Aer Powder BA 250-50 MCG/ACT: RESPIRATORY_TRACT | 30 days supply | Qty: 60 | Fill #1 | Status: AC

## 2023-07-05 ENCOUNTER — Other Ambulatory Visit: Payer: Self-pay | Admitting: Family Medicine

## 2023-07-05 ENCOUNTER — Other Ambulatory Visit: Payer: Self-pay

## 2023-07-05 MED FILL — Tramadol HCl Tab 50 MG: ORAL | 23 days supply | Qty: 90 | Fill #0 | Status: AC

## 2023-07-26 LAB — T4, FREE: Free T4: 0.98 ng/dL (ref 0.82–1.77)

## 2023-07-26 LAB — TSH: TSH: 1.54 u[IU]/mL (ref 0.450–4.500)

## 2023-07-28 ENCOUNTER — Other Ambulatory Visit: Payer: Self-pay

## 2023-07-28 MED FILL — Fluticasone-Salmeterol Aer Powder BA 250-50 MCG/ACT: RESPIRATORY_TRACT | 30 days supply | Qty: 60 | Fill #2 | Status: AC

## 2023-07-29 ENCOUNTER — Ambulatory Visit: Payer: BC Managed Care – PPO | Admitting: Nurse Practitioner

## 2023-07-29 ENCOUNTER — Encounter: Payer: Self-pay | Admitting: Nurse Practitioner

## 2023-07-29 ENCOUNTER — Other Ambulatory Visit: Payer: Self-pay

## 2023-07-29 VITALS — BP 110/80 | HR 67 | Ht 73.0 in | Wt 222.4 lb

## 2023-07-29 DIAGNOSIS — E890 Postprocedural hypothyroidism: Secondary | ICD-10-CM | POA: Diagnosis not present

## 2023-07-29 MED ORDER — LEVOTHYROXINE SODIUM 50 MCG PO TABS
50.0000 ug | ORAL_TABLET | Freq: Every day | ORAL | 3 refills | Status: DC
  Filled 2023-07-29: qty 90, 90d supply, fill #0

## 2023-07-29 NOTE — Patient Instructions (Signed)

## 2023-07-29 NOTE — Progress Notes (Signed)
 07/29/2023     Endocrinology Follow Up Note    Subjective:    Patient ID: Kyle Campbell, male    DOB: [birth year], PCP Kyle Brooks, MD.   Past Medical History:  Diagnosis Date   Asthma    Diabetes mellitus without complication (HCC)    Erectile dysfunction    GERD (gastroesophageal reflux disease)    Hyperlipidemia    Obstructive sleep apnea    compliant with CPAP   Testosterone deficiency     Past Surgical History:  Procedure Laterality Date   TONSILLECTOMY     age 67    Social History   Socioeconomic History   Marital status: Married    Spouse name: Not on file   Number of children: Not on file   Years of education: Not on file   Highest education level: 12th grade  Occupational History   Not on file  Tobacco Use   Smoking status: Never   Smokeless tobacco: Never  Vaping Use   Vaping status: Never Used  Substance and Sexual Activity   Alcohol use: Yes    Alcohol/week: 6.0 standard drinks of alcohol    Types: 1 Standard drinks or equivalent, 5 Shots of liquor per week    Comment: weekend only   Drug use: No   Sexual activity: Yes    Comment: Married.    Other Topics Concern   Not on file  Social History Narrative   Has 3 children. One 69 year, and 42 year old twins. 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    Social Drivers of Corporate investment banker Strain: Low Risk  (08/01/2022)   Overall Financial Resource Strain (CARDIA)    Difficulty of Paying Living Expenses: Not hard at all  Food Insecurity: No Food Insecurity (08/01/2022)   Hunger Vital Sign    Worried About Running Out of Food in the Last Year: Never true    Ran Out of Food in the Last Year: Never true  Transportation Campbell: No Transportation Campbell (08/01/2022)   PRAPARE - Administrator, Civil Service (Medical): No    Lack of Transportation (Non-Medical): No  Physical Activity: Unknown (08/01/2022)   Exercise Vital Sign    Days of Exercise per Week: Patient  declined    Minutes of Exercise per Session: Not on file  Stress: No Stress Concern Present (08/01/2022)   [employer] of Occupational Health - Occupational Stress Questionnaire    Feeling of Stress : Only a little  Social Connections: Socially Integrated (08/01/2022)   Social Connection and Isolation Panel [NHANES]    Frequency of Communication with Friends and Family: More than three times a week    Frequency of Social Gatherings with Friends and Family: Once a week    Attends Religious Services: More than 4 times per year    Active Member of [employer] or Organizations: Yes    Attends Engineer, structural: More than 4 times per year    Marital Status: Married    Family History  Problem Relation Age of Onset   Lung cancer Father     Outpatient Encounter Medications as of 07/29/2023  Medication Sig   albuterol (VENTOLIN HFA) 108 (90 Base) MCG/ACT inhaler Inhale 1 puff into the lungs every 4 (four) hours as needed.   Dulaglutide (TRULICITY) 3 MG/0.5ML SOAJ Inject 3 mg as directed once a week.   fluticasone-salmeterol (ADVAIR) 250-50 MCG/ACT AEPB Inhale 1 puff into the lungs 2 (  two) times daily.   glucose blood (ACCU-CHEK AVIVA PLUS) test strip Use as instructed   Lancets (ACCU-CHEK MULTICLIX) lancets Use as instructed   sildenafil (VIAGRA) 100 MG tablet Take 1/2-1 tablets (50-100 mg total) by mouth daily as needed for erectile dysfunction.   traMADol (ULTRAM) 50 MG tablet Take 1 tablet (50 mg total) by mouth every 6 (six) hours as needed for pain.   UNABLE TO FIND CPAP mask - DX: OSA G47.33   [DISCONTINUED] levothyroxine (SYNTHROID) 50 MCG tablet Take 1 tablet (50 mcg total) by mouth daily.   levothyroxine (SYNTHROID) 50 MCG tablet Take 1 tablet (50 mcg total) by mouth daily.   No facility-administered encounter medications on file as of 07/29/2023.    ALLERGIES: No Known Allergies  VACCINATION STATUS: Immunization History  Administered Date(s) Administered    Influenza,inj,Quad PF,6+ Mos 04/08/2015, 01/19/2016, 04/21/2017, 03/01/2019   Influenza-Unspecified 05/20/2003   Td 04/26/2002   Tdap 02/02/2013     HPI  Kyle Campbell is 63 y.o. male who presents today with a medical history as above. he is being seen in consultation for hyperthyroidism requested by Kyle Brooks, MD.  he has been dealing with symptoms of unexplained weight loss, tremors, palpitations, heat intolerance, weakness, fatigue, irritability, dry eyes, dizziness, and diarrhea for about 6 months now. These symptoms are progressively worsening and troubling to him.   he denies dysphagia, choking, shortness of breath, no recent voice change.    he denies family history of thyroid dysfunction and denies family hx of thyroid cancer. He does not know his family history on his fathers side.  he denies personal history of goiter. he is not on any anti-thyroid medications nor on any thyroid hormone supplements. Denies use of Biotin containing supplements.  he is willing to proceed with appropriate work up and therapy for thyrotoxicosis.  He has never had any imaging of his thyroid in the past.   He is s/p RAI ablation on 05/14/22.  Review of systems  Constitutional: + Minimally fluctuating body weight,  current Body mass index is 29.34 kg/m. , no fatigue, no subjective hyperthermia, no subjective hypothermia Eyes: no blurry vision, no xerophthalmia ENT: no sore throat, no nodules palpated in throat, no dysphagia/odynophagia, no hoarseness Cardiovascular: no chest pain, no shortness of breath, no palpitations, no leg swelling Respiratory: no cough, no shortness of breath Gastrointestinal: no nausea/vomiting/diarrhea Musculoskeletal: no muscle/joint aches Skin: no rashes, no hyperemia Neurological: no tremors, no numbness, no tingling, no dizziness Psychiatric: no depression, no anxiety   Objective:    BP 110/80 (BP Location: Left Arm, Patient Position: Sitting, Cuff Size:  Large)   Ht 6\' 1"  (1.854 m)   Wt 222 lb 6.4 oz (100.9 kg)   BMI 29.34 kg/m   Wt Readings from Last 3 Encounters:  07/29/23 222 lb 6.4 oz (100.9 kg)  03/31/23 225 lb 6.4 oz (102.2 kg)  03/25/23 223 lb 8 oz (101.4 kg)     BP Readings from Last 3 Encounters:  07/29/23 110/80  03/31/23 125/68  03/25/23 124/74                  Physical Exam- Limited  Constitutional:  Body mass index is 29.34 kg/m. , not in acute distress, normal state of mind Eyes:  EOMI, no exophthalmos Musculoskeletal: no gross deformities, strength intact in all four extremities, no gross restriction of joint movements Skin:  no rashes, no hyperemia Neurological: no tremor with outstretched hands   CMP     Component  Value Date/Time   NA 139 03/14/2023 0804   K 4.5 03/14/2023 0804   CL 103 03/14/2023 0804   CO2 28 03/14/2023 0804   GLUCOSE 132 (H) 03/14/2023 0804   BUN 14 03/14/2023 0804   CREATININE 0.98 03/14/2023 0804   CALCIUM 9.3 03/14/2023 0804   PROT 6.7 03/14/2023 0804   ALBUMIN 4.5 09/20/2016 1018   AST 14 03/14/2023 0804   ALT 20 03/14/2023 0804   ALKPHOS 72 09/20/2016 1018   BILITOT 0.4 03/14/2023 0804   GFRNONAA 73 09/19/2020 1622   GFRAA 84 09/19/2020 1622     CBC    Component Value Date/Time   WBC 7.7 03/14/2023 0804   RBC 4.67 03/14/2023 0804   HGB 14.8 03/14/2023 0804   HCT 44.2 03/14/2023 0804   PLT 195 03/14/2023 0804   MCV 94.6 03/14/2023 0804   MCH 31.7 03/14/2023 0804   MCHC 33.5 03/14/2023 0804   RDW 13.0 03/14/2023 0804   LYMPHSABS 1,813 07/29/2022 0808   MONOABS 380 09/20/2016 1018   EOSABS 393 03/14/2023 0804   BASOSABS 62 03/14/2023 0804     Diabetic Labs (most recent): Lab Results  Component Value Date   HGBA1C 6.7 (H) 03/14/2023   HGBA1C 7.0 (H) 06/28/2022   HGBA1C 9.2 (H) 01/29/2022   MICROALBUR 0.6 07/29/2022   MICROALBUR <0.2 01/05/2021   MICROALBUR 0.2 04/18/2020    Lipid Panel     Component Value Date/Time   CHOL 160 03/14/2023 0804    TRIG 175 (H) 03/14/2023 0804   HDL 44 03/14/2023 0804   CHOLHDL 3.6 03/14/2023 0804   VLDL 39 (H) 09/20/2016 1018   LDLCALC 89 03/14/2023 0804     Lab Results  Component Value Date   TSH 1.540 07/25/2023   TSH 0.495 03/24/2023   TSH 0.29 (L) 03/14/2023   TSH 0.015 (L) 12/24/2022   TSH <0.005 (L) 10/25/2022   TSH 0.009 (L) 08/19/2022   TSH <0.005 (L) 06/21/2022   TSH <0.005 (L) 02/15/2022   TSH <0.005 (L) 01/22/2022   TSH <0.005 (L) 12/16/2021   FREET4 0.98 07/25/2023   FREET4 1.06 03/24/2023   FREET4 1.32 12/24/2022   FREET4 1.46 10/25/2022   FREET4 0.78 (L) 08/19/2022   FREET4 2.65 (H) 06/21/2022   FREET4 1.39 02/15/2022   FREET4 1.76 01/22/2022   FREET4 2.73 (H) 12/16/2021   FREET4 1.6 11/03/2021      Uptake and Scan from 03/11/22 CLINICAL DATA:  Hyperthyroidism, weight loss, heat sensitivity, diarrhea, tremors, moodiness, increased thirst, palpitations, weakness, dry skin, brittle nails, suppressed TSH < 0.005   EXAM: THYROID SCAN AND UPTAKE - 4 AND 24 HOURS   TECHNIQUE: Following oral administration of I-123 capsule, anterior planar imaging was acquired at 24 hours. Thyroid uptake was calculated with a thyroid probe at 4-6 hours and 24 hours.   RADIOPHARMACEUTICALS:  453 uCi I-123 sodium iodide p.o.   COMPARISON:  None Available.   FINDINGS: Cold nodules identified at lateral aspect of inferior thyroid lobes bilaterally larger on LEFT.   No definite hot nodules.   4 hour I-123 uptake = 42.8% (normal 5-20%)   24 hour I-123 uptake = 57.5% (normal 10-30%)   IMPRESSION: Elevated 4 hour and 24 hour radio iodine uptakes consistent with hyperthyroidism.   Cold nodules at the inferior poles of both thyroid lobes, larger on LEFT; thyroid ultrasound assessment recommended prior to consideration of radioactive iodine therapy.     Electronically Signed   By: Ulyses Southward M.D.   On: 03/11/2022  11:08        Latest Reference Range & Units 08/19/22  08:06 10/25/22 08:10 12/24/22 08:19 03/14/23 08:04 03/24/23 08:31 07/25/23 08:10  TSH 0.450 - 4.500 uIU/mL 0.009 (L) <0.005 (L) 0.015 (L) 0.29 (L) 0.495 1.540  Triiodothyronine,Free,Serum 2.0 - 4.4 pg/mL 2.3       T4,Free(Direct) 0.82 - 1.77 ng/dL 8.29 (L) 5.62 1.30  8.65 0.98  (L): Data is abnormally low   Assessment & Plan:   1. Hypothyroidism-s/p RAI ablation for Graves disease 2. Thyroid antibody positive 3. Bilateral Cold thyroid nodules  he is being seen at a kind request of Kyle Brooks, MD.  his history and most recent labs are reviewed, and he was examined clinically. Subjective and objective findings are consistent with thyrotoxicosis likely from primary hyperthyroidism. The potential risks of untreated thyrotoxicosis and the need for definitive therapy have been discussed in detail with him, and he agrees to proceed with diagnostic workup and treatment plan.   His uptake and scan is consistent with Graves disease with elevated 4 and 24 hr uptakes.  He did have 2 cold nodules in bilateral lower poles which warrant evaluation prior to RAI ablation with thyroid ultrasound.  He agreed to proceed with this additional test with biopsy which was sent to [facility] and determined to be benign, with less than 4% chance of malignancy.  He underwent RAI ablation for hyperthyroidism on 05/14/22.    He called between visits with symptoms suggestive of over-replacement, therefore I asked him to skip 1 day of the week to see if it helped.  He reports improvement in his symptoms.  His previsit TFTs are consistent with appropriate hormone replacement but Free T4 has dipped a bit.  He is advised to continue his Levothyroxine 50 mcg po daily but restart taking it every day (was previously skipping 1 day).  Will recheck TFTs prior to next visit and adjust dose accordingly.   - The correct intake of thyroid hormone (Levothyroxine, Synthroid), is on empty stomach first thing in the morning, with water,  separated by at least 30 minutes from breakfast and other medications,  and separated by more than 4 hours from calcium, iron, multivitamins, acid reflux medications (PPIs).  - This medication is a life-long medication and will be needed to correct thyroid hormone imbalances for the rest of your life.  The dose may change from time to time, based on thyroid blood work.  - It is extremely important to be consistent taking this medication, near the same time each morning.  -AVOID TAKING PRODUCTS CONTAINING BIOTIN (commonly found in Hair, Skin, Nails vitamins) AS IT INTERFERES WITH THE VALIDITY OF THYROID FUNCTION BLOOD TESTS.     -Patient is advised to maintain close follow up with Kyle Brooks, MD for primary care Campbell.    I spent  18  minutes in the care of the patient today including review of labs from Thyroid Function, CMP, and other relevant labs ; imaging/biopsy records (current and previous including abstractions from other facilities); face-to-face time discussing  his lab results and symptoms, medications doses, his options of short and long term treatment based on the latest standards of care / guidelines;   and documenting the encounter.  Kyle Campbell  participated in the discussions, expressed understanding, and voiced agreement with the above plans.  All questions were answered to his satisfaction. he is encouraged to contact clinic should he have any questions or concerns prior to his return visit.  Follow up plan:  Return in about 4 months (around 11/28/2023) for Thyroid follow up, Previsit labs.   Thank you for involving me in the care of this pleasant patient, and I will continue to update you with his progress.   Ronny Bacon, [facility] [address] [place], [place] [address] Phone: [phone] Fax: [phone]  07/29/2023, 8:10 AM

## 2023-08-08 ENCOUNTER — Other Ambulatory Visit: Payer: Self-pay | Admitting: Family Medicine

## 2023-08-09 ENCOUNTER — Other Ambulatory Visit: Payer: Self-pay | Admitting: Family Medicine

## 2023-08-09 ENCOUNTER — Other Ambulatory Visit: Payer: Self-pay

## 2023-08-10 ENCOUNTER — Other Ambulatory Visit: Payer: Self-pay

## 2023-08-11 ENCOUNTER — Other Ambulatory Visit: Payer: Self-pay

## 2023-08-11 MED ORDER — ALBUTEROL SULFATE HFA 108 (90 BASE) MCG/ACT IN AERS
1.0000 | INHALATION_SPRAY | RESPIRATORY_TRACT | 0 refills | Status: AC | PRN
  Filled 2023-08-11: qty 6.7, 33d supply, fill #0

## 2023-08-11 NOTE — Telephone Encounter (Signed)
 Requested medication (s) are due for refill today: routing for review  Requested medication (s) are on the active medication list: yes  Last refill:  07/05/23  Future visit scheduled: yes  Notes to clinic:  Unable to refill per protocol, cannot delegate.      Requested Prescriptions  Pending Prescriptions Disp Refills   albuterol (VENTOLIN HFA) 108 (90 Base) MCG/ACT inhaler 6.7 g 0    Sig: Inhale 1 puff into the lungs every 4 (four) hours as needed.     Pulmonology:  Beta Agonists 2 Passed - 08/11/2023 10:26 AM      Passed - Last BP in normal range    BP Readings from Last 1 Encounters:  07/29/23 110/80         Passed - Last Heart Rate in normal range    Pulse Readings from Last 1 Encounters:  07/29/23 67         Passed - Valid encounter within last 12 months    Recent Outpatient Visits           4 months ago Controlled type 2 diabetes mellitus with complication, without long-term current use of insulin (HCC)   [place] [facility] Donita Brooks, MD   1 year ago Controlled type 2 diabetes mellitus with complication, without long-term current use of insulin (HCC)   [place] [facility] Donita Brooks, MD   1 year ago Controlled type 2 diabetes mellitus without complication, without long-term current use of insulin [facility])   [place] [facility] [name], Priscille Heidelberg, MD   1 year ago Testosterone deficiency   [place] [facility] [name], Priscille Heidelberg, MD   1 year ago Fatigue, unspecified type   [place] [facility] Donita Brooks, MD       Future Appointments             In 1 month [name], Priscille Heidelberg, MD [place] [facility]             traMADol (ULTRAM) 50 MG tablet 90 tablet 0    Sig: Take 1 tablet (50 mg total) by mouth every 6 (six) hours as needed for pain.     Not Delegated - Analgesics:  Opioid Agonists Failed - 08/11/2023 10:26 AM       Failed - This refill cannot be delegated      Failed - Urine Drug Screen completed in last 360 days      Failed - Valid encounter within last 3 months    Recent Outpatient Visits           4 months ago Controlled type 2 diabetes mellitus with complication, without long-term current use of insulin (HCC)   [place] [facility] Donita Brooks, MD   1 year ago Controlled type 2 diabetes mellitus with complication, without long-term current use of insulin [facility])   [place] [facility] Donita Brooks, MD   1 year ago Controlled type 2 diabetes mellitus without complication, without long-term current use of insulin [facility])   [place] [facility] [name], Priscille Heidelberg, MD   1 year ago Testosterone deficiency   [place] [facility] [name], Priscille Heidelberg, MD   1 year ago Fatigue, unspecified type   [place] [facility] [name], Priscille Heidelberg, MD       Future Appointments  In 1 month [name], Priscille Heidelberg, MD [facility], [facility]

## 2023-08-23 ENCOUNTER — Other Ambulatory Visit: Payer: Self-pay | Admitting: Family Medicine

## 2023-08-23 ENCOUNTER — Other Ambulatory Visit: Payer: Self-pay

## 2023-08-24 ENCOUNTER — Other Ambulatory Visit: Payer: Self-pay

## 2023-08-24 NOTE — Telephone Encounter (Signed)
 Requested medication (s) are due for refill today: yes  Requested medication (s) are on the active medication list: yes  Last refill:  07/05/23  Future visit scheduled: yes  Notes to clinic:  Unable to refill per protocol, cannot delegate.      Requested Prescriptions  Pending Prescriptions Disp Refills   traMADol (ULTRAM) 50 MG tablet 90 tablet 0    Sig: Take 1 tablet (50 mg total) by mouth every 6 (six) hours as needed for pain.     Not Delegated - Analgesics:  Opioid Agonists Failed - 08/24/2023  1:57 PM      Failed - This refill cannot be delegated      Failed - Urine Drug Screen completed in last 360 days      Failed - Valid encounter within last 3 months    Recent Outpatient Visits           5 months ago Controlled type 2 diabetes mellitus with complication, without long-term current use of insulin (HCC)   [place] [facility] Austine Lefort, MD   1 year ago Controlled type 2 diabetes mellitus with complication, without long-term current use of insulin [facility])   [place] [facility] Austine Lefort, MD   1 year ago Controlled type 2 diabetes mellitus without complication, without long-term current use of insulin [facility])   [place] [facility] [name], Cisco Crest, MD   1 year ago Testosterone deficiency   [place] [facility] Austine Lefort, MD   1 year ago Fatigue, unspecified type   [place] [facility] [name], Cisco Crest, MD       Future Appointments             In 4 weeks [name], Cisco Crest, MD [facility], [facility]

## 2023-08-25 ENCOUNTER — Other Ambulatory Visit: Payer: Self-pay

## 2023-08-25 MED FILL — Tramadol HCl Tab 50 MG: ORAL | 23 days supply | Qty: 90 | Fill #0 | Status: AC

## 2023-09-05 ENCOUNTER — Other Ambulatory Visit: Payer: Self-pay

## 2023-09-05 ENCOUNTER — Other Ambulatory Visit: Payer: Self-pay | Admitting: Family Medicine

## 2023-09-05 MED ORDER — FLUTICASONE-SALMETEROL 250-50 MCG/ACT IN AEPB
1.0000 | INHALATION_SPRAY | Freq: Two times a day (BID) | RESPIRATORY_TRACT | 2 refills | Status: DC
  Filled 2023-09-05: qty 60, 30d supply, fill #0
  Filled 2023-10-19: qty 60, 30d supply, fill #1
  Filled 2023-12-01: qty 60, 30d supply, fill #2

## 2023-09-12 ENCOUNTER — Other Ambulatory Visit: Payer: BC Managed Care – PPO

## 2023-09-12 DIAGNOSIS — E118 Type 2 diabetes mellitus with unspecified complications: Secondary | ICD-10-CM

## 2023-09-12 DIAGNOSIS — E785 Hyperlipidemia, unspecified: Secondary | ICD-10-CM

## 2023-09-12 DIAGNOSIS — R5383 Other fatigue: Secondary | ICD-10-CM

## 2023-09-12 DIAGNOSIS — E349 Endocrine disorder, unspecified: Secondary | ICD-10-CM

## 2023-09-12 DIAGNOSIS — E0590 Thyrotoxicosis, unspecified without thyrotoxic crisis or storm: Secondary | ICD-10-CM

## 2023-09-12 DIAGNOSIS — N3281 Overactive bladder: Secondary | ICD-10-CM

## 2023-09-13 LAB — COMPLETE METABOLIC PANEL WITHOUT GFR
AG Ratio: 1.7 (calc) (ref 1.0–2.5)
ALT: 30 U/L (ref 9–46)
AST: 18 U/L (ref 10–35)
Albumin: 4.3 g/dL (ref 3.6–5.1)
Alkaline phosphatase (APISO): 71 U/L (ref 35–144)
BUN: 13 mg/dL (ref 7–25)
CO2: 29 mmol/L (ref 20–32)
Calcium: 9.1 mg/dL (ref 8.6–10.3)
Chloride: 103 mmol/L (ref 98–110)
Creat: 1.03 mg/dL (ref 0.70–1.35)
Globulin: 2.5 g/dL (ref 1.9–3.7)
Glucose, Bld: 158 mg/dL — ABNORMAL HIGH (ref 65–99)
Potassium: 4.5 mmol/L (ref 3.5–5.3)
Sodium: 141 mmol/L (ref 135–146)
Total Bilirubin: 0.3 mg/dL (ref 0.2–1.2)
Total Protein: 6.8 g/dL (ref 6.1–8.1)

## 2023-09-13 LAB — CBC WITH DIFFERENTIAL/PLATELET
Absolute Lymphocytes: 2166 {cells}/uL (ref 850–3900)
Absolute Monocytes: 418 {cells}/uL (ref 200–950)
Basophils Absolute: 87 {cells}/uL (ref 0–200)
Basophils Relative: 1 %
Eosinophils Absolute: 339 {cells}/uL (ref 15–500)
Eosinophils Relative: 3.9 %
HCT: 45 % (ref 38.5–50.0)
Hemoglobin: 14.9 g/dL (ref 13.2–17.1)
MCH: 31.3 pg (ref 27.0–33.0)
MCHC: 33.1 g/dL (ref 32.0–36.0)
MCV: 94.5 fL (ref 80.0–100.0)
MPV: 9.6 fL (ref 7.5–12.5)
Monocytes Relative: 4.8 %
Neutro Abs: 5690 {cells}/uL (ref 1500–7800)
Neutrophils Relative %: 65.4 %
Platelets: 230 10*3/uL (ref 140–400)
RBC: 4.76 10*6/uL (ref 4.20–5.80)
RDW: 12.8 % (ref 11.0–15.0)
Total Lymphocyte: 24.9 %
WBC: 8.7 10*3/uL (ref 3.8–10.8)

## 2023-09-13 LAB — HEMOGLOBIN A1C
Hgb A1c MFr Bld: 7.2 % — ABNORMAL HIGH (ref <5.7)
Mean Plasma Glucose: 160 mg/dL
eAG (mmol/L): 8.9 mmol/L

## 2023-09-13 LAB — TESTOSTERONE: Testosterone: 189 ng/dL — ABNORMAL LOW (ref 250–827)

## 2023-09-13 LAB — LIPID PANEL
Cholesterol: 138 mg/dL (ref <200)
HDL: 34 mg/dL — ABNORMAL LOW (ref >=40)
LDL Cholesterol (Calc): 76 mg/dL
Non-HDL Cholesterol (Calc): 104 mg/dL (ref <130)
Total CHOL/HDL Ratio: 4.1 (calc) (ref <5.0)
Triglycerides: 182 mg/dL — ABNORMAL HIGH (ref <150)

## 2023-09-13 LAB — MICROALBUMIN / CREATININE URINE RATIO
Creatinine, Urine: 273 mg/dL (ref 20–320)
Microalb Creat Ratio: 3 mg/g{creat} (ref <30)
Microalb, Ur: 0.9 mg/dL

## 2023-09-13 LAB — PSA: PSA: 1.4 ng/mL (ref <=4.00)

## 2023-09-13 LAB — VITAMIN B12: Vitamin B-12: 486 pg/mL (ref 200–1100)

## 2023-09-22 ENCOUNTER — Other Ambulatory Visit: Payer: Self-pay

## 2023-09-22 ENCOUNTER — Encounter: Payer: Self-pay | Admitting: Family Medicine

## 2023-09-22 ENCOUNTER — Ambulatory Visit: Payer: BC Managed Care – PPO | Admitting: Family Medicine

## 2023-09-22 VITALS — BP 130/72 | HR 70 | Temp 98.4°F | Ht 73.0 in | Wt 222.6 lb

## 2023-09-22 DIAGNOSIS — E349 Endocrine disorder, unspecified: Secondary | ICD-10-CM

## 2023-09-22 DIAGNOSIS — R5383 Other fatigue: Secondary | ICD-10-CM

## 2023-09-22 DIAGNOSIS — Z5320 Procedure and treatment not carried out because of patient's decision for unspecified reasons: Secondary | ICD-10-CM

## 2023-09-22 DIAGNOSIS — Z7985 Long-term (current) use of injectable non-insulin antidiabetic drugs: Secondary | ICD-10-CM

## 2023-09-22 DIAGNOSIS — E118 Type 2 diabetes mellitus with unspecified complications: Secondary | ICD-10-CM

## 2023-09-22 MED ORDER — TESTOSTERONE CYPIONATE 200 MG/ML IM SOLN
200.0000 mg | INTRAMUSCULAR | 0 refills | Status: DC
  Filled 2023-09-22 – 2023-10-13 (×3): qty 6, 84d supply, fill #0

## 2023-09-22 MED ORDER — TIRZEPATIDE 7.5 MG/0.5ML ~~LOC~~ SOAJ
7.5000 mg | SUBCUTANEOUS | 3 refills | Status: DC
  Filled 2023-09-22: qty 2, 28d supply, fill #0
  Filled 2023-10-31: qty 2, 28d supply, fill #1
  Filled 2023-12-01: qty 2, 28d supply, fill #2
  Filled 2024-01-05: qty 2, 28d supply, fill #3

## 2023-09-22 MED ORDER — TRAMADOL HCL 50 MG PO TABS
50.0000 mg | ORAL_TABLET | Freq: Four times a day (QID) | ORAL | 0 refills | Status: DC | PRN
  Filled 2023-09-22: qty 90, 23d supply, fill #0

## 2023-09-22 NOTE — Progress Notes (Signed)
 Wt Readings from Last 3 Encounters:  09/22/23 222 lb 9.6 oz (101 kg)  07/29/23 222 lb 6.4 oz (100.9 kg)  03/31/23 225 lb 6.4 oz (102.2 kg)     Subjective:    Patient ID: Kyle Campbell, male    DOB: 11/04/1960, 63 y.o.   MRN: [id number]  Patient is a 63 year old Caucasian gentleman here today for follow-up.  His most recent lab work is shown below.  After discontinuation of metformin , his hemoglobin A1c has risen to 7.2.  He is currently on Trulicity  3 mg subcu weekly.  He seems to be tolerating the medication without problems.  He reports severely low libido.  He also reports fatigue.  He reports low sex drive and decreasing muscle strength.  He has a history of hypergonadism.  His last 2 testosterone  levels have been profoundly low.  The most recent testosterone  level was under 200.  He is now interested in trying testosterone  replacement due to the low libido.  He denies any chest pain shortness of breath or dyspnea on exertion.  Diabetic foot exam was performed today and was normal. Lab on 09/12/2023  Component Date Value Ref Range Status   WBC 09/12/2023 8.7  3.8 - 10.8 Thousand/uL Final   RBC 09/12/2023 4.76  4.20 - 5.80 Million/uL Final   Hemoglobin 09/12/2023 14.9  13.2 - 17.1 g/dL Final   HCT 78/93/8101 45.0  38.5 - 50.0 % Final   MCV 09/12/2023 94.5  80.0 - 100.0 fL Final   MCH 09/12/2023 31.3  27.0 - 33.0 pg Final   MCHC 09/12/2023 33.1  32.0 - 36.0 g/dL Final   Comment: For adults, a slight decrease in the calculated MCHC value (in the range of 30 to 32 g/dL) is most likely not clinically significant; however, it should be interpreted with caution in correlation with other red cell parameters and the patient's clinical condition.    RDW 09/12/2023 12.8  11.0 - 15.0 % Final   Platelets 09/12/2023 230  140 - 400 Thousand/uL Final   MPV 09/12/2023 9.6  7.5 - 12.5 fL Final   Neutro Abs 09/12/2023 5,690  1,500 - 7,800 cells/uL Final   Absolute Lymphocytes 09/12/2023 2,166  850 -  3,900 cells/uL Final   Absolute Monocytes 09/12/2023 418  200 - 950 cells/uL Final   Eosinophils Absolute 09/12/2023 339  15 - 500 cells/uL Final   Basophils Absolute 09/12/2023 87  0 - 200 cells/uL Final   Neutrophils Relative % 09/12/2023 65.4  % Final   Total Lymphocyte 09/12/2023 24.9  % Final   Monocytes Relative 09/12/2023 4.8  % Final   Eosinophils Relative 09/12/2023 3.9  % Final   Basophils Relative 09/12/2023 1.0  % Final   Hgb A1c MFr Bld 09/12/2023 7.2 (H)  <5.7 % Final   Comment: For someone without known diabetes, a hemoglobin A1c value of 6.5% or greater indicates that they may have  diabetes and this should be confirmed with a follow-up  test. . For someone with known diabetes, a value <7% indicates  that their diabetes is well controlled and a value  greater than or equal to 7% indicates suboptimal  control. A1c targets should be individualized based on  duration of diabetes, age, comorbid conditions, and  other considerations. . Currently, no consensus exists regarding use of hemoglobin A1c for diagnosis of diabetes for children. .    Mean Plasma Glucose 09/12/2023 160  mg/dL Final   eAG (mmol/L) 75/02/2584 8.9  mmol/L Final   Cholesterol  09/12/2023 138  <200 mg/dL Final   HDL 16/02/9603 34 (L)  > OR = 40 mg/dL Final   Triglycerides 54/01/8118 182 (H)  <150 mg/dL Final   LDL Cholesterol (Calc) 09/12/2023 76  mg/dL (calc) Final   Comment: Reference range: <100 . Desirable range <100 mg/dL for primary prevention;   <70 mg/dL for patients with CHD or diabetic patients  with > or = 2 CHD risk factors. Aaron Aas LDL-C is now calculated using the Martin-Hopkins  calculation, which is a validated novel method providing  better accuracy than the Friedewald equation in the  estimation of LDL-C.  Melinda Sprawls et al. Erroll Heard. 1478;295(62): 2061-2068  (http://education.[facility].com/faq/FAQ164)    Total CHOL/HDL Ratio 09/12/2023 4.1  <1.3 (calc) Final   Non-HDL  Cholesterol (Calc) 09/12/2023 104  <130 mg/dL (calc) Final   Comment: For patients with diabetes plus 1 major ASCVD risk  factor, treating to a non-HDL-C goal of <100 mg/dL  (LDL-C of <08 mg/dL) is considered a therapeutic  option.    Creatinine, Urine 09/12/2023 273  20 - 320 mg/dL Final   Microalb, Ur 65/78/4696 0.9  mg/dL Final   Comment: Reference Range Not established    Microalb Creat Ratio 09/12/2023 3  <30 mg/g creat Final   Comment: . The ADA defines abnormalities in albumin excretion as follows: Aaron Aas Albuminuria Category        Result (mg/g creatinine) . Normal to Mildly increased   <30 Moderately increased         30-299  Severely increased           > OR = 300 . The ADA recommends that at least two of three specimens collected within a 3-6 month period be abnormal before considering a patient to be within a diagnostic category.    Vitamin B-12 09/12/2023 486  200 - 1,100 pg/mL Final   PSA 09/12/2023 1.40  < OR = 4.00 ng/mL Final   Comment: The total PSA value from this assay system is  standardized against the WHO standard. The test  result will be approximately 20% lower when compared  to the equimolar-standardized total PSA (Beckman  Coulter). Comparison of serial PSA results should be  interpreted with this fact in mind. . This test was performed using the Siemens  chemiluminescent method. Values obtained from  different assay methods cannot be used interchangeably. PSA levels, regardless of value, should not be interpreted as absolute evidence of the presence or absence of disease.    Testosterone  09/12/2023 189 (L)  250 - 827 ng/dL Final   Comment: In hypogonadal males, Testosterone , Total, LC/MS/MS, is the recommended assay due to the diminished accuracy of immunoassay at levels below 250 ng/dL. This test code (29528) must be collected in a red-top tube with no gel.     Glucose, Bld 09/12/2023 158 (H)  65 - 99 mg/dL Final   Comment: .             Fasting reference interval . For someone without known diabetes, a glucose value >125 mg/dL indicates that they may have diabetes and this should be confirmed with a follow-up test. .    BUN 09/12/2023 13  7 - 25 mg/dL Final   Creat 41/32/4401 1.03  0.70 - 1.35 mg/dL Final   BUN/Creatinine Ratio 09/12/2023 SEE NOTE:  6 - 22 (calc) Final   Comment:    Not Reported: BUN and Creatinine are within    reference range. .    Sodium 09/12/2023 141  135 -  146 mmol/L Final   Potassium 09/12/2023 4.5  3.5 - 5.3 mmol/L Final   Chloride 09/12/2023 103  98 - 110 mmol/L Final   CO2 09/12/2023 29  20 - 32 mmol/L Final   Calcium  09/12/2023 9.1  8.6 - 10.3 mg/dL Final   Total Protein 16/02/9603 6.8  6.1 - 8.1 g/dL Final   Albumin 54/01/8118 4.3  3.6 - 5.1 g/dL Final   Globulin 14/78/2956 2.5  1.9 - 3.7 g/dL (calc) Final   AG Ratio 09/12/2023 1.7  1.0 - 2.5 (calc) Final   Total Bilirubin 09/12/2023 0.3  0.2 - 1.2 mg/dL Final   Alkaline phosphatase (APISO) 09/12/2023 71  35 - 144 U/L Final   AST 09/12/2023 18  10 - 35 U/L Final   ALT 09/12/2023 30  9 - 46 U/L Final    Past Medical History:  Diagnosis Date   Asthma    Diabetes mellitus without complication (HCC)    Erectile dysfunction    GERD (gastroesophageal reflux disease)    Hyperlipidemia    Obstructive sleep apnea    compliant with CPAP   Testosterone  deficiency    Past Surgical History:  Procedure Laterality Date   TONSILLECTOMY     age 59   Current Outpatient Medications on File Prior to Visit  Medication Sig Dispense Refill   albuterol  (VENTOLIN  HFA) 108 (90 Base) MCG/ACT inhaler Inhale 1 puff into the lungs every 4 (four) hours as needed. 6.7 g 0   fluticasone -salmeterol (ADVAIR ) 250-50 MCG/ACT AEPB Inhale 1 puff into the lungs 2 (two) times daily. 60 each 2   glucose blood (ACCU-CHEK AVIVA PLUS) test strip Use as instructed 100 each 12   Lancets (ACCU-CHEK MULTICLIX) lancets Use as instructed 100 each 12   levothyroxine   (SYNTHROID ) 50 MCG tablet Take 1 tablet (50 mcg total) by mouth daily. 90 tablet 3   sildenafil  (VIAGRA ) 100 MG tablet Take 1/2-1 tablets (50-100 mg total) by mouth daily as needed for erectile dysfunction. 5 tablet 11   UNABLE TO FIND CPAP mask - DX: OSA G47.33 1 Device 0   No current facility-administered medications on file prior to visit.       No Known Allergies Social History   Socioeconomic History   Marital status: Married    Spouse name: Not on file   Number of children: Not on file   Years of education: Not on file   Highest education level: 12th grade  Occupational History   Not on file  Tobacco Use   Smoking status: Never   Smokeless tobacco: Never  Vaping Use   Vaping status: Never Used  Substance and Sexual Activity   Alcohol use: Yes    Alcohol/week: 6.0 standard drinks of alcohol    Types: 1 Standard drinks or equivalent, 5 Shots of liquor per week    Comment: weekend only   Drug use: No   Sexual activity: Yes    Comment: Married.    Other Topics Concern   Not on file  Social History Narrative   Has 3 children. One 88 year, and 37 year old twins. 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    Social Drivers of Corporate investment banker Strain: Low Risk  (08/01/2022)   Overall Financial Resource Strain (CARDIA)    Difficulty of Paying Living Expenses: Not hard at all  Food Insecurity: No Food Insecurity (08/01/2022)   Hunger Vital Sign    Worried About Running Out of Food in the Last Year: Never  true    Ran Out of Food in the Last Year: Never true  Transportation Needs: No Transportation Needs (08/01/2022)   PRAPARE - Administrator, Civil Service (Medical): No    Lack of Transportation (Non-Medical): No  Physical Activity: Unknown (08/01/2022)   Exercise Vital Sign    Days of Exercise per Week: Patient declined    Minutes of Exercise per Session: Not on file  Stress: No Stress Concern Present (08/01/2022)   [employer] of  Occupational Health - Occupational Stress Questionnaire    Feeling of Stress : Only a little  Social Connections: Socially Integrated (08/01/2022)   Social Connection and Isolation Panel [NHANES]    Frequency of Communication with Friends and Family: More than three times a week    Frequency of Social Gatherings with Friends and Family: Once a week    Attends Religious Services: More than 4 times per year    Active Member of [employer] or Organizations: Yes    Attends Engineer, structural: More than 4 times per year    Marital Status: Married  Catering manager Violence: Not on file      Review of Systems  All other systems reviewed and are negative.      Objective:   Physical Exam Constitutional:      General: He is not in acute distress.    Appearance: He is well-developed. He is not diaphoretic.  Eyes:     Conjunctiva/sclera: Conjunctivae normal.     Pupils: Pupils are equal, round, and reactive to light.  Neck:     Thyroid : No thyromegaly.     Vascular: No JVD.  Cardiovascular:     Rate and Rhythm: Normal rate and regular rhythm.     Heart sounds: Normal heart sounds. No murmur heard.    No friction rub. No gallop.  Pulmonary:     Effort: Pulmonary effort is normal. No respiratory distress.     Breath sounds: No decreased air movement. No wheezing, rhonchi or rales.  Abdominal:     General: Bowel sounds are normal. There is no distension.     Palpations: Abdomen is soft.     Tenderness: There is no abdominal tenderness. There is no rebound.  Genitourinary:    Prostate: Normal.     Rectum: Normal.  Musculoskeletal:     Cervical back: Neck supple.  Lymphadenopathy:     Cervical: No cervical adenopathy.  Neurological:     Mental Status: He is alert and oriented to person, place, and time.     Cranial Nerves: No cranial nerve deficit.     Motor: No abnormal muscle tone.     Coordination: Coordination normal.     Deep Tendon Reflexes: Reflexes are normal and  symmetric.           Assessment & Plan:  Controlled type 2 diabetes mellitus with complication, without long-term current use of insulin  (HCC)  Testosterone  deficiency  Fatigue, unspecified type  Refusal of statin medication by patient  We have decided to replace Trulicity  with Mounjaro.  Switch to 7.5 mg subcu weekly uptitrate in 1 month tolerated to achieve better glycemic control.  Second we will start the patient on testosterone  cypionate 200 mg IM every 2 weeks.  Recheck PSA, CBC, and testosterone  level in 6 months.  I explained to the patient about increased risk of polycythemia as well as prostate cancer.  Blood pressure today is well-controlled.  LDL is excellent however the patient has declined a statin  even despite his elevated coronary artery calcium  score and his history of diabetes

## 2023-09-23 ENCOUNTER — Telehealth: Payer: Self-pay | Admitting: Pharmacy Technician

## 2023-09-23 ENCOUNTER — Other Ambulatory Visit (HOSPITAL_COMMUNITY): Payer: Self-pay

## 2023-09-23 ENCOUNTER — Other Ambulatory Visit: Payer: Self-pay

## 2023-09-23 NOTE — Telephone Encounter (Signed)
 We will appeal their decision

## 2023-09-23 NOTE — Telephone Encounter (Signed)
 Pharmacy Patient Advocate Encounter  Received notification from [facility] that Prior Authorization for  has been DENIED.  Full denial letter will be uploaded to the media tab. See denial reason below.      PA #/Case ID/Reference #: [id number]

## 2023-09-23 NOTE — Telephone Encounter (Signed)
 Pharmacy Patient Advocate Encounter   Received notification from [employer]base that prior authorization for Testosterone  Cypionate 200MG /ML intramuscular solution is required/requested.   Insurance verification completed.   The patient is insured through [facility] .   Per test claim: PA required; PA submitted to above mentioned insurance via CoverMyMeds Key/confirmation #/EOC [id number] Status is pending

## 2023-09-26 ENCOUNTER — Telehealth: Payer: Self-pay | Admitting: Pharmacist

## 2023-09-26 NOTE — Telephone Encounter (Signed)
 Insurance will cover hypogonadism if it's primary and not age-related. Please confirm which type Kyle Campbell has been diagnosed with so we can proceed with an appeal if appropriate.  Thank you, Dene Fines, PharmD Clinical Pharmacist  [name]  Direct Dial: [phone]

## 2023-09-27 ENCOUNTER — Encounter: Payer: Self-pay | Admitting: Family Medicine

## 2023-09-27 ENCOUNTER — Telehealth: Payer: Self-pay | Admitting: Pharmacist

## 2023-09-27 NOTE — Telephone Encounter (Signed)
 Appeal has been submitted for testosterone  cypionate. Will advise when response is received, please be advised that most companies may take 30 days to make a decision. Appeal letter and supporting information has been faxed to [phone] on 09/27/2023 @12 :44 pm.  Thank you, Dene Fines, PharmD Clinical Pharmacist  [place]  Direct Dial: [phone]

## 2023-09-27 NOTE — Telephone Encounter (Signed)
 An appeal will be completed and documented in a separate phone encounter.  Thank you!

## 2023-09-29 ENCOUNTER — Other Ambulatory Visit (HOSPITAL_COMMUNITY): Payer: Self-pay

## 2023-09-30 ENCOUNTER — Other Ambulatory Visit: Payer: Self-pay

## 2023-10-04 ENCOUNTER — Other Ambulatory Visit (HOSPITAL_COMMUNITY): Payer: Self-pay

## 2023-10-07 ENCOUNTER — Other Ambulatory Visit (HOSPITAL_COMMUNITY): Payer: Self-pay

## 2023-10-13 ENCOUNTER — Encounter: Payer: Self-pay | Admitting: Family Medicine

## 2023-10-13 ENCOUNTER — Other Ambulatory Visit (HOSPITAL_COMMUNITY): Payer: Self-pay

## 2023-10-13 ENCOUNTER — Other Ambulatory Visit: Payer: Self-pay

## 2023-10-13 ENCOUNTER — Encounter (HOSPITAL_COMMUNITY): Payer: Self-pay

## 2023-10-13 NOTE — Telephone Encounter (Signed)
 Testosterone  has been approved by the insurance, it is effective through 10/13/2026    Thanks, Dene Fines, PharmD Clinical Pharmacist  [place]  Direct Dial: [phone]

## 2023-10-17 ENCOUNTER — Other Ambulatory Visit (HOSPITAL_COMMUNITY): Payer: Self-pay

## 2023-10-27 ENCOUNTER — Other Ambulatory Visit: Payer: Self-pay

## 2023-10-27 ENCOUNTER — Other Ambulatory Visit: Payer: Self-pay | Admitting: Family Medicine

## 2023-10-27 MED ORDER — TRAMADOL HCL 50 MG PO TABS
50.0000 mg | ORAL_TABLET | Freq: Four times a day (QID) | ORAL | 0 refills | Status: DC | PRN
  Filled 2023-10-27: qty 90, 23d supply, fill #0

## 2023-10-31 ENCOUNTER — Other Ambulatory Visit: Payer: Self-pay

## 2023-11-24 LAB — T4, FREE: Free T4: 1.05 ng/dL (ref 0.82–1.77)

## 2023-11-24 LAB — TSH: TSH: 4.01 u[IU]/mL (ref 0.450–4.500)

## 2023-11-28 NOTE — Patient Instructions (Signed)

## 2023-11-29 ENCOUNTER — Encounter: Payer: Self-pay | Admitting: Nurse Practitioner

## 2023-11-29 ENCOUNTER — Ambulatory Visit: Admitting: Nurse Practitioner

## 2023-11-29 ENCOUNTER — Other Ambulatory Visit: Payer: Self-pay

## 2023-11-29 VITALS — BP 104/78 | HR 77 | Ht 73.0 in | Wt 214.0 lb

## 2023-11-29 DIAGNOSIS — E890 Postprocedural hypothyroidism: Secondary | ICD-10-CM

## 2023-11-29 MED ORDER — LEVOTHYROXINE SODIUM 125 MCG PO TABS
62.5000 ug | ORAL_TABLET | Freq: Every day | ORAL | 3 refills | Status: AC
  Filled 2023-11-29: qty 45, 90d supply, fill #0
  Filled 2024-03-19: qty 45, 90d supply, fill #1
  Filled 2024-06-13: qty 45, 90d supply, fill #2

## 2023-11-29 NOTE — Progress Notes (Signed)
 11/29/2023     Endocrinology Follow Up Note    Subjective:    Patient ID: Kyle Campbell, male    DOB: [birth year], PCP Kyle Butler DASEN, MD.   Past Medical History:  Diagnosis Date   Asthma    Diabetes mellitus without complication (HCC)    Erectile dysfunction    GERD (gastroesophageal reflux disease)    Hyperlipidemia    Obstructive sleep apnea    compliant with CPAP   Testosterone  deficiency     Past Surgical History:  Procedure Laterality Date   TONSILLECTOMY     age 62    Social History   Socioeconomic History   Marital status: Married    Spouse name: Not on file   Number of children: Not on file   Years of education: Not on file   Highest education level: 12th grade  Occupational History   Not on file  Tobacco Use   Smoking status: Never   Smokeless tobacco: Never  Vaping Use   Vaping status: Never Used  Substance and Sexual Activity   Alcohol use: Yes    Alcohol/week: 6.0 standard drinks of alcohol    Types: 1 Standard drinks or equivalent, 5 Shots of liquor per week    Comment: weekend only   Drug use: No   Sexual activity: Yes    Comment: Married.    Other Topics Concern   Not on file  Social History Narrative   Has 3 children. One 51 year, and 61 year old twins. 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    Social Drivers of Corporate investment banker Strain: Low Risk  (08/01/2022)   Overall Financial Resource Strain (CARDIA)    Difficulty of Paying Living Expenses: Not hard at all  Food Insecurity: No Food Insecurity (08/01/2022)   Hunger Vital Sign    Worried About Running Out of Food in the Last Year: Never true    Ran Out of Food in the Last Year: Never true  Transportation Needs: No Transportation Needs (08/01/2022)   PRAPARE - Administrator, Civil Service (Medical): No    Lack of Transportation (Non-Medical): No  Physical Activity: Unknown (08/01/2022)   Exercise Vital Sign    Days of Exercise per Week: Patient  declined    Minutes of Exercise per Session: Not on file  Stress: No Stress Concern Present (08/01/2022)   Kyle Campbell of Occupational Health - Occupational Stress Questionnaire    Feeling of Stress : Only a little  Social Connections: Socially Integrated (08/01/2022)   Social Connection and Isolation Panel    Frequency of Communication with Friends and Family: More than three times a week    Frequency of Social Gatherings with Friends and Family: Once a week    Attends Religious Services: More than 4 times per year    Active Member of [employer] or Organizations: Yes    Attends Engineer, structural: More than 4 times per year    Marital Status: Married    Family History  Problem Relation Age of Onset   Lung cancer Father     Outpatient Encounter Medications as of 11/29/2023  Medication Sig   albuterol  (VENTOLIN  HFA) 108 (90 Base) MCG/ACT inhaler Inhale 1 puff into the lungs every 4 (four) hours as needed.   fluticasone -salmeterol (ADVAIR ) 250-50 MCG/ACT AEPB Inhale 1 puff into the lungs 2 (two) times daily.   glucose blood (ACCU-CHEK AVIVA PLUS) test strip Use as instructed  Lancets (ACCU-CHEK MULTICLIX) lancets Use as instructed   levothyroxine  (SYNTHROID ) 125 MCG tablet Take 0.5 tablets (62.5 mcg total) by mouth daily before breakfast.   sildenafil  (VIAGRA ) 100 MG tablet Take 1/2-1 tablets (50-100 mg total) by mouth daily as needed for erectile dysfunction.   testosterone  cypionate (DEPOTESTOSTERONE CYPIONATE) 200 MG/ML injection Inject 1 mL (200 mg total) into the muscle every 14 (fourteen) days.   tirzepatide  (MOUNJARO ) 7.5 MG/0.5ML Pen Inject 7.5 mg into the skin once a week.   traMADol  (ULTRAM ) 50 MG tablet Take 1 tablet (50 mg total) by mouth every 6 (six) hours as needed for pain.   UNABLE TO FIND CPAP mask - DX: OSA G47.33   [DISCONTINUED] levothyroxine  (SYNTHROID ) 50 MCG tablet Take 1 tablet (50 mcg total) by mouth daily.   No facility-administered encounter  medications on file as of 11/29/2023.    ALLERGIES: No Known Allergies  VACCINATION STATUS: Immunization History  Administered Date(s) Administered   Influenza,inj,Quad PF,6+ Mos 04/08/2015, 01/19/2016, 04/21/2017, 03/01/2019   Influenza-Unspecified 05/20/2003   Td 04/26/2002   Tdap 02/02/2013     HPI  Kyle Campbell is 63 y.o. male who presents today with a medical history as above. he is being seen in consultation for hyperthyroidism requested by Kyle Butler DASEN, MD.  he has been dealing with symptoms of unexplained weight loss, tremors, palpitations, heat intolerance, weakness, fatigue, irritability, dry eyes, dizziness, and diarrhea for about 6 months now. These symptoms are progressively worsening and troubling to him.   he denies dysphagia, choking, shortness of breath, no recent voice change.    he denies family history of thyroid  dysfunction and denies family hx of thyroid  cancer. He does not know his family history on his fathers side.  he denies personal history of goiter. he is not on any anti-thyroid  medications nor on any thyroid  hormone supplements. Denies use of Biotin containing supplements.  he is willing to proceed with appropriate work up and therapy for thyrotoxicosis.  He has never had any imaging of his thyroid  in the past.   He is s/p RAI ablation on 05/14/22.  Review of systems  Constitutional: + decreasing body weight (on Mounjaro  for DM management),  current Body mass index is 28.23 kg/m. , + fatigue-improving (recently diagnosed with low Testosterone - on supplementation), no subjective hyperthermia, no subjective hypothermia Eyes: no blurry vision, no xerophthalmia ENT: no sore throat, no nodules palpated in throat, no dysphagia/odynophagia, no hoarseness Cardiovascular: no chest pain, no shortness of breath, no palpitations, no leg swelling Respiratory: no cough, no shortness of breath Gastrointestinal: no nausea/vomiting/diarrhea Musculoskeletal: no  muscle/joint aches Skin: no rashes, no hyperemia Neurological: no tremors, no numbness, no tingling, no dizziness Psychiatric: no depression, no anxiety   Objective:    BP 104/78 (BP Location: Left Arm, Patient Position: Sitting, Cuff Size: Large)   Pulse 77   Ht 6' 1 (1.854 m)   Wt 214 lb (97.1 kg)   BMI 28.23 kg/m   Wt Readings from Last 3 Encounters:  11/29/23 214 lb (97.1 kg)  09/22/23 222 lb 9.6 oz (101 kg)  07/29/23 222 lb 6.4 oz (100.9 kg)     BP Readings from Last 3 Encounters:  11/29/23 104/78  09/22/23 130/72  07/29/23 110/80                  Physical Exam- Limited  Constitutional:  Body mass index is 28.23 kg/m. , not in acute distress, normal state of mind Eyes:  EOMI, no exophthalmos Musculoskeletal: no  gross deformities, strength intact in all four extremities, no gross restriction of joint movements Skin:  no rashes, no hyperemia Neurological: no tremor with outstretched hands   CMP     Component Value Date/Time   NA 141 09/12/2023 0807   K 4.5 09/12/2023 0807   CL 103 09/12/2023 0807   CO2 29 09/12/2023 0807   GLUCOSE 158 (H) 09/12/2023 0807   BUN 13 09/12/2023 0807   CREATININE 1.03 09/12/2023 0807   CALCIUM  9.1 09/12/2023 0807   PROT 6.8 09/12/2023 0807   ALBUMIN 4.5 09/20/2016 1018   AST 18 09/12/2023 0807   ALT 30 09/12/2023 0807   ALKPHOS 72 09/20/2016 1018   BILITOT 0.3 09/12/2023 0807   GFRNONAA 73 09/19/2020 1622   GFRAA 84 09/19/2020 1622     CBC    Component Value Date/Time   WBC 8.7 09/12/2023 0807   RBC 4.76 09/12/2023 0807   HGB 14.9 09/12/2023 0807   HCT 45.0 09/12/2023 0807   PLT 230 09/12/2023 0807   MCV 94.5 09/12/2023 0807   MCH 31.3 09/12/2023 0807   MCHC 33.1 09/12/2023 0807   RDW 12.8 09/12/2023 0807   LYMPHSABS 1,813 07/29/2022 0808   MONOABS 380 09/20/2016 1018   EOSABS 339 09/12/2023 0807   BASOSABS 87 09/12/2023 0807     Diabetic Labs (most recent): Lab Results  Component Value Date   HGBA1C 7.2  (H) 09/12/2023   HGBA1C 6.7 (H) 03/14/2023   HGBA1C 7.0 (H) 06/28/2022   MICROALBUR 0.9 09/12/2023   MICROALBUR 0.6 07/29/2022   MICROALBUR <0.2 01/05/2021    Lipid Panel     Component Value Date/Time   CHOL 138 09/12/2023 0807   TRIG 182 (H) 09/12/2023 0807   HDL 34 (L) 09/12/2023 0807   CHOLHDL 4.1 09/12/2023 0807   VLDL 39 (H) 09/20/2016 1018   LDLCALC 76 09/12/2023 0807     Lab Results  Component Value Date   TSH 4.010 11/23/2023   TSH 1.540 07/25/2023   TSH 0.495 03/24/2023   TSH 0.29 (L) 03/14/2023   TSH 0.015 (L) 12/24/2022   TSH <0.005 (L) 10/25/2022   TSH 0.009 (L) 08/19/2022   TSH <0.005 (L) 06/21/2022   TSH <0.005 (L) 02/15/2022   TSH <0.005 (L) 01/22/2022   FREET4 1.05 11/23/2023   FREET4 0.98 07/25/2023   FREET4 1.06 03/24/2023   FREET4 1.32 12/24/2022   FREET4 1.46 10/25/2022   FREET4 0.78 (L) 08/19/2022   FREET4 2.65 (H) 06/21/2022   FREET4 1.39 02/15/2022   FREET4 1.76 01/22/2022   FREET4 2.73 (H) 12/16/2021      Uptake and Scan from 03/11/22 CLINICAL DATA:  Hyperthyroidism, weight loss, heat sensitivity, diarrhea, tremors, moodiness, increased thirst, palpitations, weakness, dry skin, brittle nails, suppressed TSH < 0.005   EXAM: THYROID  SCAN AND UPTAKE - 4 AND 24 HOURS   TECHNIQUE: Following oral administration of I-123 capsule, anterior planar imaging was acquired at 24 hours. Thyroid  uptake was calculated with a thyroid  probe at 4-6 hours and 24 hours.   RADIOPHARMACEUTICALS:  453 uCi I-123 sodium iodide p.o.   COMPARISON:  None Available.   FINDINGS: Cold nodules identified at lateral aspect of inferior thyroid  lobes bilaterally larger on LEFT.   No definite hot nodules.   4 hour I-123 uptake = 42.8% (normal 5-20%)   24 hour I-123 uptake = 57.5% (normal 10-30%)   IMPRESSION: Elevated 4 hour and 24 hour radio iodine uptakes consistent with hyperthyroidism.   Cold nodules at the inferior poles of  both thyroid  lobes, larger  on LEFT; thyroid  ultrasound assessment recommended prior to consideration of radioactive iodine therapy.     Electronically Signed   By: Oneil Kiss M.D.   On: 03/11/2022 11:08        Latest Reference Range & Units 12/24/22 08:19 03/14/23 08:04 03/24/23 08:31 07/25/23 08:10 11/23/23 08:07  TSH 0.450 - 4.500 uIU/mL 0.015 (L) 0.29 (L) 0.495 1.540 4.010  T4,Free(Direct) 0.82 - 1.77 ng/dL 8.67  8.93 9.01 8.94  (L): Data is abnormally low   Assessment & Plan:   1. Hypothyroidism-s/p RAI ablation for Graves disease 2. Thyroid  antibody positive 3. Bilateral Cold thyroid  nodules  he is being seen at a kind request of Kyle Butler DASEN, MD.  his history and most recent labs are reviewed, and he was examined clinically. Subjective and objective findings are consistent with thyrotoxicosis likely from primary hyperthyroidism. The potential risks of untreated thyrotoxicosis and the need for definitive therapy have been discussed in detail with him, and he agrees to proceed with diagnostic workup and treatment plan.   His uptake and scan is consistent with Graves disease with elevated 4 and 24 hr uptakes.  He did have 2 cold nodules in bilateral lower poles which warrant evaluation prior to RAI ablation with thyroid  ultrasound.  He agreed to proceed with this additional test with biopsy which was sent to [facility] and determined to be benign, with less than 4% chance of malignancy.  He underwent RAI ablation for hyperthyroidism on 05/14/22.    His previsit TFTs are consistent with slight under-replacement.  Will increase his Levothyroxine  to 62.5 mcg po daily before breakfast (taking half of a 125 mcg pill).  Will recheck TFTs prior to next visit and adjust dose accordingly.   - The correct intake of thyroid  hormone (Levothyroxine , Synthroid ), is on empty stomach first thing in the morning, with water, separated by at least 30 minutes from breakfast and other medications,  and separated by more than  4 hours from calcium , iron, multivitamins, acid reflux medications (PPIs).  - This medication is a life-long medication and will be needed to correct thyroid  hormone imbalances for the rest of your life.  The dose may change from time to time, based on thyroid  blood work.  - It is extremely important to be consistent taking this medication, near the same time each morning.  -AVOID TAKING PRODUCTS CONTAINING BIOTIN (commonly found in Hair, Skin, Nails vitamins) AS IT INTERFERES WITH THE VALIDITY OF THYROID  FUNCTION BLOOD TESTS.     -Patient is advised to maintain close follow up with Kyle Butler DASEN, MD for primary care needs.    I spent  25  minutes in the care of the patient today including review of labs from Thyroid  Function, CMP, and other relevant labs ; imaging/biopsy records (current and previous including abstractions from other facilities); face-to-face time discussing  his lab results and symptoms, medications doses, his options of short and long term treatment based on the latest standards of care / guidelines;   and documenting the encounter.  Kyle Campbell  participated in the discussions, expressed understanding, and voiced agreement with the above plans.  All questions were answered to his satisfaction. he is encouraged to contact clinic should he have any questions or concerns prior to his return visit.  Follow up plan: Return in about 4 months (around 03/31/2024) for Thyroid  follow up, Previsit labs.   Thank you for involving me in the care of this pleasant patient, and I will  continue to update you with his progress.   Benton Rio, [facility] [address] [place], [place] [address] Phone: [phone] Fax: [phone]  11/29/2023, 8:12 AM

## 2023-12-01 ENCOUNTER — Other Ambulatory Visit: Payer: Self-pay

## 2023-12-01 ENCOUNTER — Other Ambulatory Visit: Payer: Self-pay | Admitting: Family Medicine

## 2023-12-01 MED FILL — Tramadol HCl Tab 50 MG: ORAL | 23 days supply | Qty: 90 | Fill #0 | Status: AC

## 2023-12-02 ENCOUNTER — Other Ambulatory Visit: Payer: Self-pay

## 2024-01-05 ENCOUNTER — Other Ambulatory Visit: Payer: Self-pay

## 2024-01-05 ENCOUNTER — Other Ambulatory Visit: Payer: Self-pay | Admitting: Family Medicine

## 2024-01-06 ENCOUNTER — Other Ambulatory Visit (HOSPITAL_BASED_OUTPATIENT_CLINIC_OR_DEPARTMENT_OTHER): Payer: Self-pay

## 2024-01-06 ENCOUNTER — Other Ambulatory Visit: Payer: Self-pay

## 2024-01-06 MED ORDER — TRAMADOL HCL 50 MG PO TABS
50.0000 mg | ORAL_TABLET | Freq: Four times a day (QID) | ORAL | 0 refills | Status: DC | PRN
  Filled 2024-01-06 (×3): qty 90, 23d supply, fill #0

## 2024-01-06 MED ORDER — FLUTICASONE-SALMETEROL 250-50 MCG/ACT IN AEPB
1.0000 | INHALATION_SPRAY | Freq: Two times a day (BID) | RESPIRATORY_TRACT | 2 refills | Status: DC
  Filled 2024-01-06 (×3): qty 60, 30d supply, fill #0
  Filled 2024-02-13: qty 60, 30d supply, fill #1
  Filled 2024-03-21: qty 60, 30d supply, fill #2

## 2024-01-08 ENCOUNTER — Other Ambulatory Visit: Payer: Self-pay

## 2024-02-13 ENCOUNTER — Other Ambulatory Visit: Payer: Self-pay | Admitting: Family Medicine

## 2024-02-13 ENCOUNTER — Other Ambulatory Visit: Payer: Self-pay

## 2024-02-13 DIAGNOSIS — E119 Type 2 diabetes mellitus without complications: Secondary | ICD-10-CM

## 2024-02-13 DIAGNOSIS — E785 Hyperlipidemia, unspecified: Secondary | ICD-10-CM

## 2024-02-13 MED ORDER — TRAMADOL HCL 50 MG PO TABS
50.0000 mg | ORAL_TABLET | Freq: Four times a day (QID) | ORAL | 0 refills | Status: DC | PRN
  Filled 2024-02-13: qty 90, 23d supply, fill #0

## 2024-02-13 MED ORDER — TIRZEPATIDE 7.5 MG/0.5ML ~~LOC~~ SOAJ
7.5000 mg | SUBCUTANEOUS | 3 refills | Status: AC
  Filled 2024-02-13: qty 2, 28d supply, fill #0
  Filled 2024-03-19: qty 2, 28d supply, fill #1
  Filled 2024-04-24: qty 2, 28d supply, fill #2
  Filled 2024-06-13: qty 2, 28d supply, fill #3

## 2024-02-14 ENCOUNTER — Other Ambulatory Visit (HOSPITAL_COMMUNITY): Payer: Self-pay

## 2024-03-19 ENCOUNTER — Other Ambulatory Visit: Payer: Self-pay | Admitting: Family Medicine

## 2024-03-19 ENCOUNTER — Other Ambulatory Visit: Payer: Self-pay

## 2024-03-20 ENCOUNTER — Other Ambulatory Visit: Payer: Self-pay

## 2024-03-20 NOTE — Telephone Encounter (Signed)
 Requested medications are due for refill today.  yes  Requested medications are on the active medications list.  yes  Last refill. 02/13/2024 #90 0 rf  Future visit scheduled.   yes  Notes to clinic.  Refill not delegated.    Requested Prescriptions  Pending Prescriptions Disp Refills   traMADol  (ULTRAM ) 50 MG tablet 90 tablet 0    Sig: Take 1 tablet (50 mg total) by mouth every 6 (six) hours as needed for pain.     Not Delegated - Analgesics:  Opioid Agonists Failed - 03/20/2024  4:34 PM      Failed - This refill cannot be delegated      Failed - Urine Drug Screen completed in last 360 days      Failed - Valid encounter within last 3 months    Recent Outpatient Visits           6 months ago Controlled type 2 diabetes mellitus with complication, without long-term current use of insulin  [facility])   [place] [facility] Duanne Butler DASEN, MD   12 months ago Controlled type 2 diabetes mellitus with complication, without long-term current use of insulin  [facility])   [place] [facility] Duanne Butler DASEN, MD   1 year ago Controlled type 2 diabetes mellitus with complication, without long-term current use of insulin  [facility])   [place] [facility] Duanne Butler DASEN, MD   1 year ago Controlled type 2 diabetes mellitus without complication, without long-term current use of insulin  [facility])   [place] [facility] Duanne Butler DASEN, MD   2 years ago Testosterone  deficiency   [place] [facility] [name], Butler DASEN, MD

## 2024-03-21 ENCOUNTER — Other Ambulatory Visit: Payer: Self-pay

## 2024-03-21 ENCOUNTER — Other Ambulatory Visit: Payer: Self-pay | Admitting: Family Medicine

## 2024-03-21 ENCOUNTER — Encounter: Payer: Self-pay | Admitting: Family Medicine

## 2024-03-22 ENCOUNTER — Other Ambulatory Visit: Payer: Self-pay

## 2024-03-22 ENCOUNTER — Other Ambulatory Visit

## 2024-03-22 ENCOUNTER — Other Ambulatory Visit: Payer: Self-pay | Admitting: Family Medicine

## 2024-03-22 DIAGNOSIS — E349 Endocrine disorder, unspecified: Secondary | ICD-10-CM

## 2024-03-22 DIAGNOSIS — E785 Hyperlipidemia, unspecified: Secondary | ICD-10-CM

## 2024-03-22 DIAGNOSIS — R5383 Other fatigue: Secondary | ICD-10-CM

## 2024-03-22 DIAGNOSIS — E119 Type 2 diabetes mellitus without complications: Secondary | ICD-10-CM

## 2024-03-22 MED ORDER — TESTOSTERONE CYPIONATE 200 MG/ML IM SOLN
200.0000 mg | INTRAMUSCULAR | 0 refills | Status: AC
  Filled 2024-03-22: qty 6, 84d supply, fill #0

## 2024-03-22 MED ORDER — TRAMADOL HCL 50 MG PO TABS
50.0000 mg | ORAL_TABLET | Freq: Four times a day (QID) | ORAL | 0 refills | Status: DC | PRN
  Filled 2024-03-22: qty 90, 23d supply, fill #0

## 2024-03-23 ENCOUNTER — Ambulatory Visit: Payer: Self-pay | Admitting: Family Medicine

## 2024-03-24 ENCOUNTER — Other Ambulatory Visit: Payer: Self-pay

## 2024-03-26 ENCOUNTER — Ambulatory Visit: Admitting: Family Medicine

## 2024-03-26 ENCOUNTER — Other Ambulatory Visit: Payer: Self-pay

## 2024-03-26 ENCOUNTER — Encounter: Payer: Self-pay | Admitting: Family Medicine

## 2024-03-26 VITALS — BP 118/64 | HR 64 | Temp 98.3°F | Ht 73.0 in | Wt 205.0 lb

## 2024-03-26 DIAGNOSIS — Z7985 Long-term (current) use of injectable non-insulin antidiabetic drugs: Secondary | ICD-10-CM

## 2024-03-26 DIAGNOSIS — E349 Endocrine disorder, unspecified: Secondary | ICD-10-CM | POA: Diagnosis not present

## 2024-03-26 DIAGNOSIS — E118 Type 2 diabetes mellitus with unspecified complications: Secondary | ICD-10-CM | POA: Diagnosis not present

## 2024-03-26 DIAGNOSIS — Z1211 Encounter for screening for malignant neoplasm of colon: Secondary | ICD-10-CM | POA: Diagnosis not present

## 2024-03-26 DIAGNOSIS — E785 Hyperlipidemia, unspecified: Secondary | ICD-10-CM | POA: Diagnosis not present

## 2024-03-26 MED ORDER — SILDENAFIL CITRATE 100 MG PO TABS
50.0000 mg | ORAL_TABLET | Freq: Every day | ORAL | 11 refills | Status: AC | PRN
  Filled 2024-03-26: qty 5, 5d supply, fill #0

## 2024-03-26 NOTE — Progress Notes (Signed)
 Wt Readings from Last 3 Encounters:  03/26/24 205 lb (93 kg)  11/29/23 214 lb (97.1 kg)  09/22/23 222 lb 9.6 oz (101 kg)     Subjective:    Patient ID: Kyle Campbell Finder, male    DOB: November 28, 1960, 63 y.o.   MRN: [id number]  Patient is a 63 year old Caucasian gentleman here today for follow-up.  His most recent lab work is shown below.  Patient has a history of Graves' disease status post thyroid  ablation currently on levothyroxine  after posttreatment hypothyroidism developed.  He also has a history of diabetes mellitus type 2 currently on Mounjaro  7.5 mg subcu weekly.  He also has a history of hypogonadism currently on testosterone  replacement.  Since I last saw the patient he has lost almost 17 pounds on the Mounjaro .  He denies any hypoglycemic episodes however his hemoglobin A1c is now outstanding at 5.8.  I believe he is now at a healthy weight for his height.  His BMI is 27 but physically appears to be healthy weight.  He denies any chest pain shortness of breath or dyspnea on exertion.  He denies any neuropathy in his feet. Lab on 03/22/2024  Component Date Value Ref Range Status   WBC 03/22/2024 6.8  3.8 - 10.8 Thousand/uL Final   RBC 03/22/2024 4.84  4.20 - 5.80 Million/uL Final   Hemoglobin 03/22/2024 15.4  13.2 - 17.1 g/dL Final   HCT 88/86/7974 46.1  38.5 - 50.0 % Final   MCV 03/22/2024 95.2  80.0 - 100.0 fL Final   MCH 03/22/2024 31.8  27.0 - 33.0 pg Final   MCHC 03/22/2024 33.4  32.0 - 36.0 g/dL Final   Comment: For adults, a slight decrease in the calculated MCHC value (in the range of 30 to 32 g/dL) is most likely not clinically significant; however, it should be interpreted with caution in correlation with other red cell parameters and the patient's clinical condition.    RDW 03/22/2024 13.2  11.0 - 15.0 % Final   Platelets 03/22/2024 203  140 - 400 Thousand/uL Final   MPV 03/22/2024 9.8  7.5 - 12.5 fL Final   Neutro Abs 03/22/2024 3,828  1,500 - 7,800 cells/uL Final    Absolute Lymphocytes 03/22/2024 2,176  850 - 3,900 cells/uL Final   Absolute Monocytes 03/22/2024 394  200 - 950 cells/uL Final   Eosinophils Absolute 03/22/2024 340  15 - 500 cells/uL Final   Basophils Absolute 03/22/2024 61  0 - 200 cells/uL Final   Neutrophils Relative % 03/22/2024 56.3  % Final   Total Lymphocyte 03/22/2024 32.0  % Final   Monocytes Relative 03/22/2024 5.8  % Final   Eosinophils Relative 03/22/2024 5.0  % Final   Basophils Relative 03/22/2024 0.9  % Final   Glucose, Bld 03/22/2024 109 (H)  65 - 99 mg/dL Final   Comment: .            Fasting reference interval . For someone without known diabetes, a glucose value between 100 and 125 mg/dL is consistent with prediabetes and should be confirmed with a follow-up test. .    BUN 03/22/2024 11  7 - 25 mg/dL Final   Creat 88/86/7974 1.00  0.70 - 1.35 mg/dL Final   BUN/Creatinine Ratio 03/22/2024 SEE NOTE:  6 - 22 (calc) Final   Comment:    Not Reported: BUN and Creatinine are within    reference range. .    Sodium 03/22/2024 140  135 - 146 mmol/L Final   Potassium 03/22/2024  4.3  3.5 - 5.3 mmol/L Final   Chloride 03/22/2024 102  98 - 110 mmol/L Final   CO2 03/22/2024 28  20 - 32 mmol/L Final   Calcium  03/22/2024 9.0  8.6 - 10.3 mg/dL Final   Total Protein 88/86/7974 6.7  6.1 - 8.1 g/dL Final   Albumin 88/86/7974 4.5  3.6 - 5.1 g/dL Final   Globulin 88/86/7974 2.2  1.9 - 3.7 g/dL (calc) Final   AG Ratio 03/22/2024 2.0  1.0 - 2.5 (calc) Final   Total Bilirubin 03/22/2024 0.5  0.2 - 1.2 mg/dL Final   Alkaline phosphatase (APISO) 03/22/2024 58  35 - 144 U/L Final   AST 03/22/2024 14  10 - 35 U/L Final   ALT 03/22/2024 13  9 - 46 U/L Final   Cholesterol 03/22/2024 155  <200 mg/dL Final   HDL 88/86/7974 46  > OR = 40 mg/dL Final   Triglycerides 88/86/7974 82  <150 mg/dL Final   LDL Cholesterol (Calc) 03/22/2024 92  mg/dL (calc) Final   Comment: Reference range: <100 . Desirable range <100 mg/dL for primary  prevention;   <70 mg/dL for patients with CHD or diabetic patients  with > or = 2 CHD risk factors. SABRA LDL-C is now calculated using the Martin-Hopkins  calculation, which is a validated novel method providing  better accuracy than the Friedewald equation in the  estimation of LDL-C.  Gladis APPLETHWAITE et al. SANDREA. 7986;689(80): 2061-2068  (http://education.[facility].com/faq/FAQ164)    Total CHOL/HDL Ratio 03/22/2024 3.4  <4.9 (calc) Final   Non-HDL Cholesterol (Calc) 03/22/2024 109  <130 mg/dL (calc) Final   Comment: For patients with diabetes plus 1 major ASCVD risk  factor, treating to a non-HDL-C goal of <100 mg/dL  (LDL-C of <29 mg/dL) is considered a therapeutic  option.    Hgb A1c MFr Bld 03/22/2024 5.8 (H)  <5.7 % Final   Comment: For someone without known diabetes, a hemoglobin  A1c value between 5.7% and 6.4% is consistent with prediabetes and should be confirmed with a  follow-up test. . For someone with known diabetes, a value <7% indicates that their diabetes is well controlled. A1c targets should be individualized based on duration of diabetes, age, comorbid conditions, and other considerations. . This assay result is consistent with an increased risk of diabetes. . Currently, no consensus exists regarding use of hemoglobin A1c for diagnosis of diabetes for children. .    Mean Plasma Glucose 03/22/2024 120  mg/dL Final   eAG (mmol/L) 88/86/7974 6.6  mmol/L Final    Past Medical History:  Diagnosis Date   Asthma    Diabetes mellitus without complication (HCC)    Erectile dysfunction    GERD (gastroesophageal reflux disease)    Hyperlipidemia    Obstructive sleep apnea    compliant with CPAP   Testosterone  deficiency    Past Surgical History:  Procedure Laterality Date   TONSILLECTOMY     age 50   Current Outpatient Medications on File Prior to Visit  Medication Sig Dispense Refill   albuterol  (VENTOLIN  HFA) 108 (90 Base) MCG/ACT inhaler Inhale 1  puff into the lungs every 4 (four) hours as needed. 6.7 g 0   fluticasone -salmeterol (ADVAIR ) 250-50 MCG/ACT AEPB Inhale 1 puff into the lungs 2 (two) times daily. 60 each 2   glucose blood (ACCU-CHEK AVIVA PLUS) test strip Use as instructed 100 each 12   Lancets (ACCU-CHEK MULTICLIX) lancets Use as instructed 100 each 12   levothyroxine  (SYNTHROID ) 125 MCG tablet Take 0.5  tablets (62.5 mcg total) by mouth daily before breakfast. 45 tablet 3   testosterone  cypionate (DEPOTESTOSTERONE CYPIONATE) 200 MG/ML injection Inject 1 mL (200 mg total) into the muscle every 14 (fourteen) days. 10 mL 0   tirzepatide  (MOUNJARO ) 7.5 MG/0.5ML Pen Inject 7.5 mg into the skin once a week. 2 mL 3   traMADol  (ULTRAM ) 50 MG tablet Take 1 tablet (50 mg total) by mouth every 6 (six) hours as needed for pain. 90 tablet 0   UNABLE TO FIND CPAP mask - DX: OSA G47.33 1 Device 0   No current facility-administered medications on file prior to visit.       No Known Allergies Social History   Socioeconomic History   Marital status: Married    Spouse name: Not on file   Number of children: Not on file   Years of education: Not on file   Highest education level: 12th grade  Occupational History   Not on file  Tobacco Use   Smoking status: Never   Smokeless tobacco: Never  Vaping Use   Vaping status: Never Used  Substance and Sexual Activity   Alcohol use: Yes    Alcohol/week: 6.0 standard drinks of alcohol    Types: 1 Standard drinks or equivalent, 5 Shots of liquor per week    Comment: weekend only   Drug use: No   Sexual activity: Yes    Comment: Married.    Other Topics Concern   Not on file  Social History Narrative   Has 3 children. One 53 year, and 43 year old twins. Works as Naval Architect. Occasional  ETOH.    Social Drivers of Corporate Investment Banker Strain: Low Risk  (08/01/2022)   Overall Financial Resource Strain (CARDIA)    Difficulty of Paying Living Expenses: Not hard at all  Food  Insecurity: No Food Insecurity (08/01/2022)   Hunger Vital Sign    Worried About Running Out of Food in the Last Year: Never true    Ran Out of Food in the Last Year: Never true  Transportation Needs: No Transportation Needs (08/01/2022)   PRAPARE - Administrator, Civil Service (Medical): No    Lack of Transportation (Non-Medical): No  Physical Activity: Unknown (08/01/2022)   Exercise Vital Sign    Days of Exercise per Week: Patient declined    Minutes of Exercise per Session: Not on file  Stress: No Stress Concern Present (08/01/2022)   [employer] of Occupational Health - Occupational Stress Questionnaire    Feeling of Stress : Only a little  Social Connections: Socially Integrated (08/01/2022)   Social Connection and Isolation Panel    Frequency of Communication with Friends and Family: More than three times a week    Frequency of Social Gatherings with Friends and Family: Once a week    Attends Religious Services: More than 4 times per year    Active Member of [employer] or Organizations: Yes    Attends Engineer, Structural: More than 4 times per year    Marital Status: Married  Catering Manager Violence: Not on file      Review of Systems  All other systems reviewed and are negative.      Objective:   Physical Exam Constitutional:      General: He is not in acute distress.    Appearance: He is well-developed. He is not diaphoretic.  Eyes:     Conjunctiva/sclera: Conjunctivae normal.     Pupils: Pupils are equal,  round, and reactive to light.  Neck:     Thyroid : No thyromegaly.     Vascular: No JVD.  Cardiovascular:     Rate and Rhythm: Normal rate and regular rhythm.     Heart sounds: Normal heart sounds. No murmur heard.    No friction rub. No gallop.  Pulmonary:     Effort: Pulmonary effort is normal. No respiratory distress.     Breath sounds: No decreased air movement. No wheezing, rhonchi or rales.  Abdominal:     General: Bowel sounds  are normal. There is no distension.     Palpations: Abdomen is soft.     Tenderness: There is no abdominal tenderness. There is no rebound.  Genitourinary:    Prostate: Normal.     Rectum: Normal.  Musculoskeletal:     Cervical back: Neck supple.  Lymphadenopathy:     Cervical: No cervical adenopathy.  Neurological:     Mental Status: He is alert and oriented to person, place, and time.     Cranial Nerves: No cranial nerve deficit.     Motor: No abnormal muscle tone.     Coordination: Coordination normal.     Deep Tendon Reflexes: Reflexes are normal and symmetric.           Assessment & Plan:  Colon cancer screening - Plan: Cologuard  Controlled type 2 diabetes mellitus with complication, without long-term current use of insulin  (HCC)  Dyslipidemia  Testosterone  deficiency Patient is overdue for colon cancer screening.  I will schedule him for Cologuard.  He is currently on testosterone  replacement for hypogonadism however he continues to endorse erectile dysfunction.  I will add Viagra  50 to 100 mg p.o. daily as needed sexual activity.  I will also add a PSA to his lab work to rule out elevations in PSA due to testosterone  replacement.  His most recent CBC shows no polycythemia.  His hemoglobin A1c is outstanding.  I believe he is at a healthy weight for his height.  We discussed reducing the dose of Mounjaro  but he elects to continue the present dose for the time being.  If he continues to lose weight, we may want to reduce the dose of Mounjaro  at that time.  I offered the patient a flu shot but he declined this.  Patient declines shingles vaccine and pneumonia vaccine.  I will discuss this again with him at his next visit.

## 2024-03-27 LAB — CBC WITH DIFFERENTIAL/PLATELET
Absolute Lymphocytes: 2176 {cells}/uL (ref 850–3900)
Absolute Monocytes: 394 {cells}/uL (ref 200–950)
Basophils Absolute: 61 {cells}/uL (ref 0–200)
Basophils Relative: 0.9 %
Eosinophils Absolute: 340 {cells}/uL (ref 15–500)
Eosinophils Relative: 5 %
HCT: 46.1 % (ref 38.5–50.0)
Hemoglobin: 15.4 g/dL (ref 13.2–17.1)
MCH: 31.8 pg (ref 27.0–33.0)
MCHC: 33.4 g/dL (ref 32.0–36.0)
MCV: 95.2 fL (ref 80.0–100.0)
MPV: 9.8 fL (ref 7.5–12.5)
Monocytes Relative: 5.8 %
Neutro Abs: 3828 {cells}/uL (ref 1500–7800)
Neutrophils Relative %: 56.3 %
Platelets: 203 Thousand/uL (ref 140–400)
RBC: 4.84 Million/uL (ref 4.20–5.80)
RDW: 13.2 % (ref 11.0–15.0)
Total Lymphocyte: 32 %
WBC: 6.8 Thousand/uL (ref 3.8–10.8)

## 2024-03-27 LAB — LIPID PANEL
Cholesterol: 155 mg/dL (ref <200)
HDL: 46 mg/dL (ref >=40)
LDL Cholesterol (Calc): 92 mg/dL
Non-HDL Cholesterol (Calc): 109 mg/dL (ref <130)
Total CHOL/HDL Ratio: 3.4 (calc) (ref <5.0)
Triglycerides: 82 mg/dL (ref <150)

## 2024-03-27 LAB — COMPLETE METABOLIC PANEL WITHOUT GFR
AG Ratio: 2 (calc) (ref 1.0–2.5)
ALT: 13 U/L (ref 9–46)
AST: 14 U/L (ref 10–35)
Albumin: 4.5 g/dL (ref 3.6–5.1)
Alkaline phosphatase (APISO): 58 U/L (ref 35–144)
BUN: 11 mg/dL (ref 7–25)
CO2: 28 mmol/L (ref 20–32)
Calcium: 9 mg/dL (ref 8.6–10.3)
Chloride: 102 mmol/L (ref 98–110)
Creat: 1 mg/dL (ref 0.70–1.35)
Globulin: 2.2 g/dL (ref 1.9–3.7)
Glucose, Bld: 109 mg/dL — ABNORMAL HIGH (ref 65–99)
Potassium: 4.3 mmol/L (ref 3.5–5.3)
Sodium: 140 mmol/L (ref 135–146)
Total Bilirubin: 0.5 mg/dL (ref 0.2–1.2)
Total Protein: 6.7 g/dL (ref 6.1–8.1)

## 2024-03-27 LAB — HEMOGLOBIN A1C
Hgb A1c MFr Bld: 5.8 % — ABNORMAL HIGH (ref <5.7)
Mean Plasma Glucose: 120 mg/dL
eAG (mmol/L): 6.6 mmol/L

## 2024-03-27 LAB — TEST AUTHORIZATION

## 2024-03-27 LAB — PSA: PSA: 0.83 ng/mL (ref <=4.00)

## 2024-03-29 LAB — TSH: TSH: 2.14 u[IU]/mL (ref 0.450–4.500)

## 2024-03-29 LAB — T4, FREE: Free T4: 1.13 ng/dL (ref 0.82–1.77)

## 2024-03-29 NOTE — Patient Instructions (Signed)

## 2024-04-02 ENCOUNTER — Ambulatory Visit: Admitting: Nurse Practitioner

## 2024-04-02 ENCOUNTER — Encounter: Payer: Self-pay | Admitting: Nurse Practitioner

## 2024-04-02 VITALS — BP 110/80 | HR 99 | Ht 73.0 in | Wt 206.6 lb

## 2024-04-02 DIAGNOSIS — E890 Postprocedural hypothyroidism: Secondary | ICD-10-CM

## 2024-04-02 NOTE — Progress Notes (Signed)
 04/02/2024     Endocrinology Follow Up Note    Subjective:    Patient ID: Kyle Campbell, male    DOB: 10/02/1960, PCP Duanne Butler DASEN, MD.   Past Medical History:  Diagnosis Date   Asthma    Diabetes mellitus without complication (HCC)    Erectile dysfunction    GERD (gastroesophageal reflux disease)    Hyperlipidemia    Obstructive sleep apnea    compliant with CPAP   Testosterone  deficiency     Past Surgical History:  Procedure Laterality Date   TONSILLECTOMY     age 21    Social History   Socioeconomic History   Marital status: Married    Spouse name: Not on file   Number of children: Not on file   Years of education: Not on file   Highest education level: 12th grade  Occupational History   Not on file  Tobacco Use   Smoking status: Never   Smokeless tobacco: Never  Vaping Use   Vaping status: Never Used  Substance and Sexual Activity   Alcohol use: Yes    Alcohol/week: 6.0 standard drinks of alcohol    Types: 1 Standard drinks or equivalent, 5 Shots of liquor per week    Comment: weekend only   Drug use: No   Sexual activity: Yes    Comment: Married.    Other Topics Concern   Not on file  Social History Narrative   Has 3 children. One 55 year, and 39 year old twins. Works as Naval Architect. Occasional  ETOH.    Social Drivers of Corporate Investment Banker Strain: Low Risk  (08/01/2022)   Overall Financial Resource Strain (CARDIA)    Difficulty of Paying Living Expenses: Not hard at all  Food Insecurity: No Food Insecurity (08/01/2022)   Hunger Vital Sign    Worried About Running Out of Food in the Last Year: Never true    Ran Out of Food in the Last Year: Never true  Transportation Needs: No Transportation Needs (08/01/2022)   PRAPARE - Administrator, Civil Service (Medical): No    Lack of Transportation (Non-Medical): No  Physical Activity: Unknown (08/01/2022)   Exercise Vital Sign    Days of Exercise per Week: Patient  declined    Minutes of Exercise per Session: Not on file  Stress: No Stress Concern Present (08/01/2022)   [employer] of Occupational Health - Occupational Stress Questionnaire    Feeling of Stress : Only a little  Social Connections: Socially Integrated (08/01/2022)   Social Connection and Isolation Panel    Frequency of Communication with Friends and Family: More than three times a week    Frequency of Social Gatherings with Friends and Family: Once a week    Attends Religious Services: More than 4 times per year    Active Member of [employer] or Organizations: Yes    Attends Engineer, Structural: More than 4 times per year    Marital Status: Married    Family History  Problem Relation Age of Onset   Lung cancer Father     Outpatient Encounter Medications as of 04/02/2024  Medication Sig   albuterol  (VENTOLIN  HFA) 108 (90 Base) MCG/ACT inhaler Inhale 1 puff into the lungs every 4 (four) hours as needed.   fluticasone -salmeterol (ADVAIR ) 250-50 MCG/ACT AEPB Inhale 1 puff into the lungs 2 (two) times daily.   glucose blood (ACCU-CHEK AVIVA PLUS) test strip Use as instructed  Lancets (ACCU-CHEK MULTICLIX) lancets Use as instructed   levothyroxine  (SYNTHROID ) 125 MCG tablet Take 0.5 tablets (62.5 mcg total) by mouth daily before breakfast.   sildenafil  (VIAGRA ) 100 MG tablet Take 1/2-1 tablets (50-100 mg total) by mouth daily as needed for erectile dysfunction.   testosterone  cypionate (DEPOTESTOSTERONE CYPIONATE) 200 MG/ML injection Inject 1 mL (200 mg total) into the muscle every 14 (fourteen) days.   tirzepatide  (MOUNJARO ) 7.5 MG/0.5ML Pen Inject 7.5 mg into the skin once a week.   traMADol  (ULTRAM ) 50 MG tablet Take 1 tablet (50 mg total) by mouth every 6 (six) hours as needed for pain.   UNABLE TO FIND CPAP mask - DX: OSA G47.33   No facility-administered encounter medications on file as of 04/02/2024.    ALLERGIES: No Known Allergies  VACCINATION  STATUS: Immunization History  Administered Date(s) Administered   Influenza,inj,Quad PF,6+ Mos 04/08/2015, 01/19/2016, 04/21/2017, 02/20/2018, 03/01/2019   Influenza-Unspecified 05/20/2003   Td 04/26/2002   Tdap 02/02/2013     HPI  Kyle Campbell is 63 y.o. male who presents today with a medical history as above. he is being seen in consultation for hyperthyroidism requested by Duanne Butler DASEN, MD.  he has been dealing with symptoms of unexplained weight loss, tremors, palpitations, heat intolerance, weakness, fatigue, irritability, dry eyes, dizziness, and diarrhea for about 6 months now. These symptoms are progressively worsening and troubling to him.   he denies dysphagia, choking, shortness of breath, no recent voice change.    he denies family history of thyroid  dysfunction and denies family hx of thyroid  cancer. He does not know his family history on his fathers side.  he denies personal history of goiter. he is not on any anti-thyroid  medications nor on any thyroid  hormone supplements. Denies use of Biotin containing supplements.  he is willing to proceed with appropriate work up and therapy for thyrotoxicosis.  He has never had any imaging of his thyroid  in the past.   He is s/p RAI ablation on 05/14/22.  Review of systems  Constitutional: + Minimally fluctuating body weight,  current Body mass index is 27.26 kg/m. , + fatigue-improving (started on Testosterone  replacement), no subjective hyperthermia, no subjective hypothermia Eyes: no blurry vision, no xerophthalmia ENT: no sore throat, no nodules palpated in throat, no dysphagia/odynophagia, no hoarseness Cardiovascular: no chest pain, no shortness of breath, no palpitations, no leg swelling Respiratory: no cough, no shortness of breath Gastrointestinal: no nausea/vomiting/diarrhea Musculoskeletal: no muscle/joint aches Skin: no rashes, no hyperemia Neurological: no tremors, no numbness, no tingling, no  dizziness Psychiatric: no depression, no anxiety   Objective:    BP 110/80 (BP Location: Left Arm, Patient Position: Sitting, Cuff Size: Large)   Pulse 99   Ht 6' 1 (1.854 m)   Wt 206 lb 9.6 oz (93.7 kg)   BMI 27.26 kg/m   Wt Readings from Last 3 Encounters:  04/02/24 206 lb 9.6 oz (93.7 kg)  03/26/24 205 lb (93 kg)  11/29/23 214 lb (97.1 kg)     BP Readings from Last 3 Encounters:  04/02/24 110/80  03/26/24 118/64  11/29/23 104/78                  Physical Exam- Limited  Constitutional:  Body mass index is 27.26 kg/m. , not in acute distress, normal state of mind Eyes:  EOMI, no exophthalmos Musculoskeletal: no gross deformities, strength intact in all four extremities, no gross restriction of joint movements Skin:  no rashes, no hyperemia Neurological: no tremor  with outstretched hands   CMP     Component Value Date/Time   NA 140 03/22/2024 0759   K 4.3 03/22/2024 0759   CL 102 03/22/2024 0759   CO2 28 03/22/2024 0759   GLUCOSE 109 (H) 03/22/2024 0759   BUN 11 03/22/2024 0759   CREATININE 1.00 03/22/2024 0759   CALCIUM  9.0 03/22/2024 0759   PROT 6.7 03/22/2024 0759   ALBUMIN 4.5 09/20/2016 1018   AST 14 03/22/2024 0759   ALT 13 03/22/2024 0759   ALKPHOS 72 09/20/2016 1018   BILITOT 0.5 03/22/2024 0759   GFRNONAA 73 09/19/2020 1622   GFRAA 84 09/19/2020 1622     CBC    Component Value Date/Time   WBC 6.8 03/22/2024 0759   RBC 4.84 03/22/2024 0759   HGB 15.4 03/22/2024 0759   HCT 46.1 03/22/2024 0759   PLT 203 03/22/2024 0759   MCV 95.2 03/22/2024 0759   MCH 31.8 03/22/2024 0759   MCHC 33.4 03/22/2024 0759   RDW 13.2 03/22/2024 0759   LYMPHSABS 1,813 07/29/2022 0808   MONOABS 380 09/20/2016 1018   EOSABS 340 03/22/2024 0759   BASOSABS 61 03/22/2024 0759     Diabetic Labs (most recent): Lab Results  Component Value Date   HGBA1C 5.8 (H) 03/22/2024   HGBA1C 7.2 (H) 09/12/2023   HGBA1C 6.7 (H) 03/14/2023   MICROALBUR 0.9 09/12/2023    MICROALBUR 0.6 07/29/2022   MICROALBUR <0.2 01/05/2021    Lipid Panel     Component Value Date/Time   CHOL 155 03/22/2024 0759   TRIG 82 03/22/2024 0759   HDL 46 03/22/2024 0759   CHOLHDL 3.4 03/22/2024 0759   VLDL 39 (H) 09/20/2016 1018   LDLCALC 92 03/22/2024 0759     Lab Results  Component Value Date   TSH 2.140 03/28/2024   TSH 4.010 11/23/2023   TSH 1.540 07/25/2023   TSH 0.495 03/24/2023   TSH 0.29 (L) 03/14/2023   TSH 0.015 (L) 12/24/2022   TSH <0.005 (L) 10/25/2022   TSH 0.009 (L) 08/19/2022   TSH <0.005 (L) 06/21/2022   TSH <0.005 (L) 02/15/2022   FREET4 1.13 03/28/2024   FREET4 1.05 11/23/2023   FREET4 0.98 07/25/2023   FREET4 1.06 03/24/2023   FREET4 1.32 12/24/2022   FREET4 1.46 10/25/2022   FREET4 0.78 (L) 08/19/2022   FREET4 2.65 (H) 06/21/2022   FREET4 1.39 02/15/2022   FREET4 1.76 01/22/2022      Uptake and Scan from 03/11/22 CLINICAL DATA:  Hyperthyroidism, weight loss, heat sensitivity, diarrhea, tremors, moodiness, increased thirst, palpitations, weakness, dry skin, brittle nails, suppressed TSH < 0.005   EXAM: THYROID  SCAN AND UPTAKE - 4 AND 24 HOURS   TECHNIQUE: Following oral administration of I-123 capsule, anterior planar imaging was acquired at 24 hours. Thyroid  uptake was calculated with a thyroid  probe at 4-6 hours and 24 hours.   RADIOPHARMACEUTICALS:  453 uCi I-123 sodium iodide p.o.   COMPARISON:  None Available.   FINDINGS: Cold nodules identified at lateral aspect of inferior thyroid  lobes bilaterally larger on LEFT.   No definite hot nodules.   4 hour I-123 uptake = 42.8% (normal 5-20%)   24 hour I-123 uptake = 57.5% (normal 10-30%)   IMPRESSION: Elevated 4 hour and 24 hour radio iodine uptakes consistent with hyperthyroidism.   Cold nodules at the inferior poles of both thyroid  lobes, larger on LEFT; thyroid  ultrasound assessment recommended prior to consideration of radioactive iodine therapy.      Electronically Signed   By:  Oneil Kiss M.D.   On: 03/11/2022 11:08        Latest Reference Range & Units 03/24/23 08:31 07/25/23 08:10 11/23/23 08:07 03/28/24 08:10  TSH 0.450 - 4.500 uIU/mL 0.495 1.540 4.010 2.140  T4,Free(Direct) 0.82 - 1.77 ng/dL 8.93 9.01 8.94 8.86     Assessment & Plan:   1. Hypothyroidism-s/p RAI ablation for Graves disease 2. Thyroid  antibody positive 3. Bilateral Cold thyroid  nodules  he is being seen at a kind request of Duanne Lowers T, MD.  His uptake and scan is consistent with Graves disease with elevated 4 and 24 hr uptakes.  He did have 2 cold nodules in bilateral lower poles which warrant evaluation prior to RAI ablation with thyroid  ultrasound.  He agreed to proceed with this additional test with biopsy which was sent to [facility] and determined to be benign, with less than 4% chance of malignancy.  He underwent RAI ablation for hyperthyroidism on 05/14/22.    His previsit TFTs are consistent with appropriate hormone replacement.  He is advised to continue Levothyroxine  62.5 mcg po daily before breakfast (taking half of a 125 mcg pill).  Will recheck TFTs prior to next visit and adjust dose accordingly.   - The correct intake of thyroid  hormone (Levothyroxine , Synthroid ), is on empty stomach first thing in the morning, with water, separated by at least 30 minutes from breakfast and other medications,  and separated by more than 4 hours from calcium , iron, multivitamins, acid reflux medications (PPIs).  - This medication is a life-long medication and will be needed to correct thyroid  hormone imbalances for the rest of your life.  The dose may change from time to time, based on thyroid  blood work.  - It is extremely important to be consistent taking this medication, near the same time each morning.  -AVOID TAKING PRODUCTS CONTAINING BIOTIN (commonly found in Hair, Skin, Nails vitamins) AS IT INTERFERES WITH THE VALIDITY OF THYROID  FUNCTION BLOOD  TESTS.    -Patient is advised to maintain close follow up with Duanne Lowers DASEN, MD for primary care needs.    I spent  16  minutes in the care of the patient today including review of labs from Thyroid  Function, CMP, and other relevant labs ; imaging/biopsy records (current and previous including abstractions from other facilities); face-to-face time discussing  his lab results and symptoms, medications doses, his options of short and long term treatment based on the latest standards of care / guidelines;   and documenting the encounter.  Kyle Campbell  participated in the discussions, expressed understanding, and voiced agreement with the above plans.  All questions were answered to his satisfaction. he is encouraged to contact clinic should he have any questions or concerns prior to his return visit.  Follow up plan: Return in about 6 months (around 09/30/2024) for Thyroid  follow up, Previsit labs.   Thank you for involving me in the care of this pleasant patient, and I will continue to update you with his progress.   Benton Rio, [facility] [address] [place], [place] [address] Phone: [phone] Fax: [phone]  04/02/2024, 8:06 AM

## 2024-04-24 ENCOUNTER — Other Ambulatory Visit: Payer: Self-pay

## 2024-04-24 ENCOUNTER — Other Ambulatory Visit: Payer: Self-pay | Admitting: Family Medicine

## 2024-04-25 ENCOUNTER — Other Ambulatory Visit: Payer: Self-pay

## 2024-04-25 MED ORDER — FLUTICASONE-SALMETEROL 250-50 MCG/ACT IN AEPB
1.0000 | INHALATION_SPRAY | Freq: Two times a day (BID) | RESPIRATORY_TRACT | 2 refills | Status: AC
  Filled 2024-04-25: qty 60, 30d supply, fill #0

## 2024-04-26 ENCOUNTER — Other Ambulatory Visit: Payer: Self-pay

## 2024-04-26 ENCOUNTER — Encounter: Payer: Self-pay | Admitting: Family Medicine

## 2024-04-26 MED ORDER — TRAMADOL HCL 50 MG PO TABS
50.0000 mg | ORAL_TABLET | Freq: Four times a day (QID) | ORAL | 0 refills | Status: DC | PRN
  Filled 2024-04-26: qty 90, 23d supply, fill #0

## 2024-05-21 LAB — COLOGUARD: COLOGUARD: NEGATIVE

## 2024-05-22 ENCOUNTER — Ambulatory Visit: Payer: Self-pay | Admitting: Family Medicine

## 2024-05-30 ENCOUNTER — Other Ambulatory Visit: Payer: Self-pay | Admitting: Family Medicine

## 2024-05-31 ENCOUNTER — Other Ambulatory Visit: Payer: Self-pay

## 2024-05-31 MED ORDER — TRAMADOL HCL 50 MG PO TABS
50.0000 mg | ORAL_TABLET | Freq: Four times a day (QID) | ORAL | 0 refills | Status: AC | PRN
  Filled 2024-05-31: qty 90, 23d supply, fill #0

## 2024-06-14 ENCOUNTER — Other Ambulatory Visit: Payer: Self-pay

## 2024-06-15 ENCOUNTER — Other Ambulatory Visit: Payer: Self-pay

## 2024-09-24 ENCOUNTER — Other Ambulatory Visit

## 2024-10-02 ENCOUNTER — Ambulatory Visit: Admitting: Nurse Practitioner
